# Patient Record
Sex: Female | Born: 1995 | Race: Black or African American | Hispanic: No | Marital: Single | Smoking: Former smoker
Health system: Southern US, Community
[De-identification: ages and names within clinical notes are randomized; demographics above are authoritative.]

## PROBLEM LIST (undated history)

## (undated) ENCOUNTER — Inpatient Hospital Stay (HOSPITAL_COMMUNITY): Payer: Self-pay

## (undated) DIAGNOSIS — N921 Excessive and frequent menstruation with irregular cycle: Secondary | ICD-10-CM

## (undated) DIAGNOSIS — K219 Gastro-esophageal reflux disease without esophagitis: Secondary | ICD-10-CM

## (undated) DIAGNOSIS — F419 Anxiety disorder, unspecified: Secondary | ICD-10-CM

## (undated) DIAGNOSIS — T7840XA Allergy, unspecified, initial encounter: Secondary | ICD-10-CM

## (undated) DIAGNOSIS — D649 Anemia, unspecified: Secondary | ICD-10-CM

## (undated) DIAGNOSIS — F32A Depression, unspecified: Secondary | ICD-10-CM

## (undated) DIAGNOSIS — Z22322 Carrier or suspected carrier of Methicillin resistant Staphylococcus aureus: Secondary | ICD-10-CM

## (undated) HISTORY — DX: Excessive and frequent menstruation with irregular cycle: N92.1

## (undated) HISTORY — DX: Allergy, unspecified, initial encounter: T78.40XA

## (undated) HISTORY — PX: LAPAROSCOPY ABDOMEN DIAGNOSTIC: PRO50

## (undated) HISTORY — PX: WISDOM TOOTH EXTRACTION: SHX21

## (undated) HISTORY — PX: ABDOMINAL HYSTERECTOMY: SHX81

## (undated) HISTORY — PX: NO PAST SURGERIES: SHX2092

---
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
## 2015-03-10 ENCOUNTER — Encounter (HOSPITAL_COMMUNITY): Payer: Self-pay | Admitting: Cardiology

## 2015-03-10 ENCOUNTER — Emergency Department (HOSPITAL_COMMUNITY)
Admission: EM | Admit: 2015-03-10 | Discharge: 2015-03-10 | Disposition: A | Discharge status: Home / Self Care | Payer: Medicaid Other | Attending: Emergency Medicine | Admitting: Emergency Medicine

## 2015-03-10 DIAGNOSIS — R0789 Other chest pain: Secondary | ICD-10-CM | POA: Insufficient documentation

## 2015-03-10 DIAGNOSIS — R21 Rash and other nonspecific skin eruption: Secondary | ICD-10-CM | POA: Diagnosis not present

## 2015-03-10 DIAGNOSIS — Z8614 Personal history of Methicillin resistant Staphylococcus aureus infection: Secondary | ICD-10-CM | POA: Insufficient documentation

## 2015-03-10 DIAGNOSIS — Z8742 Personal history of other diseases of the female genital tract: Secondary | ICD-10-CM | POA: Diagnosis not present

## 2015-03-10 DIAGNOSIS — L853 Xerosis cutis: Secondary | ICD-10-CM

## 2015-03-10 DIAGNOSIS — Z7951 Long term (current) use of inhaled steroids: Secondary | ICD-10-CM | POA: Insufficient documentation

## 2015-03-10 DIAGNOSIS — Z79899 Other long term (current) drug therapy: Secondary | ICD-10-CM | POA: Diagnosis not present

## 2015-03-10 DIAGNOSIS — L988 Other specified disorders of the skin and subcutaneous tissue: Secondary | ICD-10-CM | POA: Diagnosis not present

## 2015-03-10 DIAGNOSIS — J069 Acute upper respiratory infection, unspecified: Secondary | ICD-10-CM | POA: Diagnosis not present

## 2015-03-10 DIAGNOSIS — J45909 Unspecified asthma, uncomplicated: Secondary | ICD-10-CM | POA: Diagnosis not present

## 2015-03-10 DIAGNOSIS — R05 Cough: Secondary | ICD-10-CM | POA: Diagnosis present

## 2015-03-10 MED ORDER — BENZONATATE 100 MG PO CAPS: 100.0000 mg | ORAL_CAPSULE | Freq: Three times a day (TID) | ORAL | Status: DC

## 2015-03-10 MED ORDER — ALBUTEROL SULFATE HFA 108 (90 BASE) MCG/ACT IN AERS
2.0000 | INHALATION_SPRAY | RESPIRATORY_TRACT | Status: DC | PRN
  Administered 2015-03-10: 2 via RESPIRATORY_TRACT
  Filled 2015-03-10: qty 6.7

## 2015-03-10 NOTE — ED Notes (Signed)
Pt reports she has been coughing and has chest congestion for the past couple of days. Also reports a rash to bilateral skin folds in the groin.

## 2015-03-10 NOTE — Discharge Instructions (Signed)
Upper Respiratory Infection, Adult °Most upper respiratory infections (URIs) are a viral infection of the air passages leading to the lungs. A URI affects the nose, throat, and upper air passages. The most common type of URI is nasopharyngitis and is typically referred to as "the common cold." °URIs run their course and usually go away on their own. Most of the time, a URI does not require medical attention, but sometimes a bacterial infection in the upper airways can follow a viral infection. This is called a secondary infection. Sinus and middle ear infections are common types of secondary upper respiratory infections. °Bacterial pneumonia can also complicate a URI. A URI can worsen asthma and chronic obstructive pulmonary disease (COPD). Sometimes, these complications can require emergency medical care and may be life threatening.  °CAUSES °Almost all URIs are caused by viruses. A virus is a type of germ and can spread from one person to another.  °RISKS FACTORS °You may be at risk for a URI if:  °· You smoke.   °· You have chronic heart or lung disease. °· You have a weakened defense (immune) system.   °· You are very young or very old.   °· You have nasal allergies or asthma. °· You work in crowded or poorly ventilated areas. °· You work in health care facilities or schools. °SIGNS AND SYMPTOMS  °Symptoms typically develop 2-3 days after you come in contact with a cold virus. Most viral URIs last 7-10 days. However, viral URIs from the influenza virus (flu virus) can last 14-18 days and are typically more severe. Symptoms may include:  °· Runny or stuffy (congested) nose.   °· Sneezing.   °· Cough.   °· Sore throat.   °· Headache.   °· Fatigue.   °· Fever.   °· Loss of appetite.   °· Pain in your forehead, behind your eyes, and over your cheekbones (sinus pain). °· Muscle aches.   °DIAGNOSIS  °Your health care provider may diagnose a URI by: °· Physical exam. °· Tests to check that your symptoms are not due to  another condition such as: °· Strep throat. °· Sinusitis. °· Pneumonia. °· Asthma. °TREATMENT  °A URI goes away on its own with time. It cannot be cured with medicines, but medicines may be prescribed or recommended to relieve symptoms. Medicines may help: °· Reduce your fever. °· Reduce your cough. °· Relieve nasal congestion. °HOME CARE INSTRUCTIONS  °· Take medicines only as directed by your health care provider.   °· Gargle warm saltwater or take cough drops to comfort your throat as directed by your health care provider. °· Use a warm mist humidifier or inhale steam from a shower to increase air moisture. This may make it easier to breathe. °· Drink enough fluid to keep your urine clear or pale yellow.   °· Eat soups and other clear broths and maintain good nutrition.   °· Rest as needed.   °· Return to work when your temperature has returned to normal or as your health care provider advises. You may need to stay home longer to avoid infecting others. You can also use a face mask and careful hand washing to prevent spread of the virus. °· Increase the usage of your inhaler if you have asthma.   °· Do not use any tobacco products, including cigarettes, chewing tobacco, or electronic cigarettes. If you need help quitting, ask your health care provider. °PREVENTION  °The best way to protect yourself from getting a cold is to practice good hygiene.  °· Avoid oral or hand contact with people with cold   symptoms.   Wash your hands often if contact occurs.  There is no clear evidence that vitamin C, vitamin E, echinacea, or exercise reduces the chance of developing a cold. However, it is always recommended to get plenty of rest, exercise, and practice good nutrition.  SEEK MEDICAL CARE IF:   You are getting worse rather than better.   Your symptoms are not controlled by medicine.   You have chills.  You have worsening shortness of breath.  You have brown or red mucus.  You have yellow or brown nasal  discharge.  You have pain in your face, especially when you bend forward.  You have a fever.  You have swollen neck glands.  You have pain while swallowing.  You have white areas in the back of your throat. SEEK IMMEDIATE MEDICAL CARE IF:   You have severe or persistent:  Headache.  Ear pain.  Sinus pain.  Chest pain.  You have chronic lung disease and any of the following:  Wheezing.  Prolonged cough.  Coughing up blood.  A change in your usual mucus.  You have a stiff neck.  You have changes in your:  Vision.  Hearing.  Thinking.  Mood. MAKE SURE YOU:   Understand these instructions.  Will watch your condition.  Will get help right away if you are not doing well or get worse.   This information is not intended to replace advice given to you by your health care provider. Make sure you discuss any questions you have with your health care provider.   Document Released: 08/22/2000 Document Revised: 07/13/2014 Document Reviewed: 06/03/2013 Elsevier Interactive Patient Education 2016 [employer] Rash A rash is a change in the color or texture of the skin. There are many different types of rashes. You may have other problems that accompany your rash. CAUSES   Infections.  Allergic reactions. This can include allergies to pets or foods.  Certain medicines.  Exposure to certain chemicals, soaps, or cosmetics.  Heat.  Exposure to poisonous plants.  Tumors, both cancerous and noncancerous. SYMPTOMS   Redness.  Scaly skin.  Itchy skin.  Dry or cracked skin.  Bumps.  Blisters.  Pain. DIAGNOSIS  Your caregiver may do a physical exam to determine what type of rash you have. A skin sample (biopsy) may be taken and examined under a microscope. TREATMENT  Treatment depends on the type of rash you have. Your caregiver may prescribe certain medicines. For serious conditions, you may need to see a skin doctor (dermatologist). HOME CARE  INSTRUCTIONS   Avoid the substance that caused your rash.  Do not scratch your rash. This can cause infection.  You may take cool baths to help stop itching.  Only take over-the-counter or prescription medicines as directed by your caregiver.  Keep all follow-up appointments as directed by your caregiver. SEEK IMMEDIATE MEDICAL CARE IF:  You have increasing pain, swelling, or redness.  You have a fever.  You have new or severe symptoms.  You have body aches, diarrhea, or vomiting.  Your rash is not better after 3 days. MAKE SURE YOU:  Understand these instructions.  Will watch your condition.  Will get help right away if you are not doing well or get worse.   This information is not intended to replace advice given to you by your health care provider. Make sure you discuss any questions you have with your health care provider.   Document Released: 02/16/2002 Document Revised: 03/19/2014 Document Reviewed: 07/14/2014 Elsevier Interactive  Patient Education 2016 [employer].

## 2015-03-10 NOTE — ED Provider Notes (Signed)
CSN: [id number]     Arrival date & time 03/10/15  1001 History  By signing my name below, I, [name], attest that this documentation has been prepared under the direction and in the presence of Marlon Pel, PA-C Electronically Signed: Charline Bills, ED Scribe 03/10/2015 at 11:02 AM.   Chief Complaint  Patient presents with  . Cough  . Rash   The history is provided by the patient. No language interpreter was used.   HPI Comments: Melanie Cain is a 19 y.o. female, with a h/o asthma, who presents to the Emergency Department complaining of gradually worsening cough for 2-3 days. Pt reports associated sore throat, congestion, chest tightness only with coughing and non-pruritic rash to groin first noticed 2 days ago. She has tried Delsym and her albuterol inhaler without significant relief. Pt denies fever. She noticed the rash after shaving with a dull razor the day prior, she describes it as flaky and looks like dry skin. Her mom recommended she use cocoa butter for the area but she was afraid to use it without having it looked at first. No vaginal discharge, or vaginal bleeding.  PCP: Leo Grosser, MD  ROS: The patient denies diaphoresis, fever, headache, weakness (general or focal), confusion, change of vision,  dysphagia, aphagia, shortness of breath,  abdominal pains, nausea, vomiting, diarrhea, lower extremity swelling, rash, neck pain, chest pain   Past Medical History  Diagnosis Date  . Allergy   . Asthma   . Menometrorrhagia   . MRSA (methicillin resistant staph aureus) culture positive 2012    culture from abscess   Past Surgical History  Procedure Laterality Date  . No past surgeries     Family History  Problem Relation Age of Onset  . Asthma Other   . Cancer Other   . Asthma Mother   . Hypertension Mother   . Asthma Father   . Asthma Sister     2 sisters  . Cancer Maternal Grandmother     breast cancer  . Hearing loss Maternal Grandmother     Social History  Substance Use Topics  . Smoking status: Never Smoker   . Smokeless tobacco: Never Used  . Alcohol Use: No   OB History    Gravida Para Term Preterm AB TAB SAB Ectopic Multiple Living   0 0 0 0 0 0 1     Review of Systems  Constitutional: Negative for fever.  HENT: Positive for congestion and sore throat.   Respiratory: Positive for cough and chest tightness (only with coughing).   Skin: Positive for rash.  All other systems reviewed and are negative.  Allergies  Montelukast sodium and Prednisone  Home Medications   Prior to Admission medications   Medication Sig Start Date End Date Taking? Authorizing Provider  acetaminophen (TYLENOL) 325 MG tablet Take 650 mg by mouth every 6 (six) hours as needed for mild pain.    Historical Provider, MD  albuterol (PROVENTIL HFA;VENTOLIN HFA) 108 (90 BASE) MCG/ACT inhaler Inhale 2 puffs into the lungs 2 (two) times daily as needed for wheezing or shortness of breath.     Historical Provider, MD  benzonatate (TESSALON) 100 MG capsule Take 1 capsule (100 mg total) by mouth every 8 (eight) hours. 03/10/15   Dorothie Wah Neva Seat, PA-C  Fluticasone-Salmeterol (ADVAIR) 500-50 MCG/DOSE AEPB Inhale 1 puff into the lungs 2 (two) times daily.     Historical Provider, MD  levalbuterol Pauline Aus) 0.63 MG/3ML nebulizer solution Take 0.63 mg by nebulization  2 (two) times daily as needed for wheezing or shortness of breath.     Historical Provider, MD  norgestrel-ethinyl estradiol (LO/OVRAL,CRYSELLE) 0.3-30 MG-MCG tablet Take 1 tablet by mouth daily. 11/11/13   Allie BossierMyra C Dove, MD  ondansetron (ZOFRAN) 4 MG tablet Take 1 tablet (4 mg total) by mouth every 6 (six) hours. 05/25/14   Marny LowensteinJulie N Wenzel, PA-C   BP 129/68 mmHg  Pulse 71  Temp(Src) 98.2 F (36.8 C) (Oral)  Resp 18  Ht 5\' 5"  (1.651 m)  Wt 131 lb (59.421 kg)  BMI 21.80 kg/m2  SpO2 100%  LMP 02/22/2015 Physical Exam  Constitutional: She is oriented to person, place, and time. She  appears well-developed and well-nourished. No distress.  HENT:  Head: Normocephalic and atraumatic.  Right Ear: Tympanic membrane and ear canal normal.  Left Ear: Tympanic membrane and ear canal normal.  Nose: Nose normal.  Mouth/Throat: Uvula is midline, oropharynx is clear and moist and mucous membranes are normal.  Eyes: Conjunctivae and EOM are normal. Pupils are equal, round, and reactive to light.  Neck: Normal range of motion. Neck supple. No tracheal deviation present.  Cardiovascular: Normal rate and regular rhythm.   Pulmonary/Chest: Effort normal. No respiratory distress.  Abdominal: Soft.  No signs of abdominal distention  Musculoskeletal: Normal range of motion.  No LE swelling  Neurological: She is alert and oriented to person, place, and time.  Acting at baseline  Skin: Skin is warm and dry. No rash noted.  Dry skin to inguinal folds. No vesicles, lesions, papules. It does not itch and is not painful to palpation  Psychiatric: She has a normal mood and affect. Her behavior is normal.  Nursing note and vitals reviewed.  ED Course  Procedures (including critical care time) DIAGNOSTIC STUDIES: Oxygen Saturation is 100% on RA, normal by my interpretation.    COORDINATION OF CARE: 10:58 AM-Discussed treatment plan which includes albuterol inhaler and Tessalon with pt at bedside and pt agreed to plan.  She is to use cream or ointment lotion for inguinal rash, to return if symptoms change or don't improve.  Labs Review Labs Reviewed - No data to display  Imaging Review No results found. I have personally reviewed and evaluated these images and lab results as part of my medical decision-making.   EKG Interpretation None      MDM   Final diagnoses:  URI (upper respiratory infection)  Dry skin   Patients symptoms are consistent with URI, likely viral etiology. Discussed that antibiotics are not indicated for viral infections. Pt will be discharged with symptomatic  treatment.  Verbalizes understanding and is agreeable with plan. Pt is hemodynamically stable & in NAD prior to dc.  I personally performed the services described in this documentation, which was scribed in my presence. The recorded information has been reviewed and is accurate.   Marlon Peliffany Atalaya Zappia, PA-C 03/10/15 1107  Marlon Peliffany Jaylah Goodlow, PA-C 03/10/15 1109  Donnetta HutchingBrian Cook, MD 03/10/15 [phone]

## 2015-03-13 NOTE — L&D Delivery Note (Signed)
Delivery Note At 1:12 PM a viable female was delivered via Vaginal, Spontaneous Delivery (Presentation: cephalic).  APGAR: 9, 9; weight  .   Placenta status: intact. Cord: 3V with the following complications:none .  Cord pH: n/a  Anesthesia:  none Episiotomy: None Lacerations: 1st degree;Vaginal;Periurethral Suture Repair: 3.0 vicryl Est. Blood Loss (mL): 200  Mom to postpartum.  Baby to Couplet care / Skin to Skin.  Purcell NailsROBERTS,Debbi Strandberg Y 11/23/2015, 1:45 PM

## 2015-03-16 ENCOUNTER — Emergency Department (INDEPENDENT_AMBULATORY_CARE_PROVIDER_SITE_OTHER)
Admission: EM | Admit: 2015-03-16 | Discharge: 2015-03-16 | Disposition: A | Discharge status: Home / Self Care | Payer: Medicaid Other | Source: Home / Self Care | Attending: Family Medicine | Admitting: Family Medicine

## 2015-03-16 ENCOUNTER — Encounter (HOSPITAL_COMMUNITY): Payer: Self-pay | Admitting: *Deleted

## 2015-03-16 DIAGNOSIS — J069 Acute upper respiratory infection, unspecified: Secondary | ICD-10-CM | POA: Diagnosis not present

## 2015-03-16 MED ORDER — IPRATROPIUM BROMIDE 0.06 % NA SOLN: 2.0000 | Freq: Four times a day (QID) | NASAL | Status: DC

## 2015-03-16 NOTE — ED Notes (Signed)
Pt  Reports     Symptoms    Of  Cough         As well  As  A  Rash   From   tessalon  pearles            Pt  Reports   Symptoms  Of  Cough   As  Well    Pt is  Requesting a  Note  For  Work   She  Is  Sitting  Upright on the  Exam table  Speaking   In   Complete  sentances

## 2015-03-16 NOTE — ED Provider Notes (Signed)
CSN: [id number]     Arrival date & time 03/16/15  1708 History   First MD Initiated Contact with Patient 03/16/15 1915     Chief Complaint  Patient presents with  . Cough   (Consider location/radiation/quality/duration/timing/severity/associated sxs/prior Treatment) Patient is a 20 y.o. female presenting with cough. The history is provided by the patient and a parent.  Cough Cough characteristics:  Non-productive and dry Severity:  Mild Onset quality:  Gradual Duration:  1 week Progression:  Improving Chronicity:  New Context: sick contacts, upper respiratory infection and weather changes   Context comment:  Seen in ER 12/29. Associated symptoms: rhinorrhea   Associated symptoms: no fever, no shortness of breath and no wheezing     Past Medical History  Diagnosis Date  . Allergy   . Asthma   . Menometrorrhagia   . MRSA (methicillin resistant staph aureus) culture positive 2012    culture from abscess   Past Surgical History  Procedure Laterality Date  . No past surgeries     Family History  Problem Relation Age of Onset  . Asthma Other   . Cancer Other   . Asthma Mother   . Hypertension Mother   . Asthma Father   . Asthma Sister     2 sisters  . Cancer Maternal Grandmother     breast cancer  . Hearing loss Maternal Grandmother    Social History  Substance Use Topics  . Smoking status: Never Smoker   . Smokeless tobacco: Never Used  . Alcohol Use: No   OB History    Gravida Para Term Preterm AB TAB SAB Ectopic Multiple Living   1 1 1  0 0 0 0 0 0 1     Review of Systems  Constitutional: Negative.  Negative for fever.  HENT: Positive for congestion, postnasal drip and rhinorrhea.   Respiratory: Positive for cough. Negative for shortness of breath and wheezing.   Cardiovascular: Negative.   All other systems reviewed and are negative.   Allergies  Montelukast sodium and Prednisone  Home Medications   Prior to Admission medications   Medication Sig  Start Date End Date Taking? Authorizing Provider  acetaminophen (TYLENOL) 325 MG tablet Take 650 mg by mouth every 6 (six) hours as needed for mild pain.    Historical Provider, MD  albuterol (PROVENTIL HFA;VENTOLIN HFA) 108 (90 BASE) MCG/ACT inhaler Inhale 2 puffs into the lungs 2 (two) times daily as needed for wheezing or shortness of breath.     Historical Provider, MD  benzonatate (TESSALON) 100 MG capsule Take 1 capsule (100 mg total) by mouth every 8 (eight) hours. 03/10/15   Tiffany Neva SeatGreene, PA-C  Fluticasone-Salmeterol (ADVAIR) 500-50 MCG/DOSE AEPB Inhale 1 puff into the lungs 2 (two) times daily.     Historical Provider, MD  levalbuterol Pauline Aus(XOPENEX) 0.63 MG/3ML nebulizer solution Take 0.63 mg by nebulization 2 (two) times daily as needed for wheezing or shortness of breath.     Historical Provider, MD  norgestrel-ethinyl estradiol (LO/OVRAL,CRYSELLE) 0.3-30 MG-MCG tablet Take 1 tablet by mouth daily. 11/11/13   Allie BossierMyra C Dove, MD  ondansetron (ZOFRAN) 4 MG tablet Take 1 tablet (4 mg total) by mouth every 6 (six) hours. 05/25/14   Marny LowensteinJulie N Wenzel, PA-C   Meds Ordered and Administered this Visit  Medications - No data to display  BP 114/62 mmHg  Pulse 75  Temp(Src) 97.9 F (36.6 C) (Oral)  Resp 18  SpO2 100%  LMP 02/22/2015 No data found.   Physical Exam  Constitutional: She is oriented to person, place, and time. She appears well-developed and well-nourished. No distress.  HENT:  Mouth/Throat: Oropharynx is clear and moist.  Neck: Normal range of motion. Neck supple.  Cardiovascular: Normal heart sounds and intact distal pulses.   Pulmonary/Chest: Effort normal and breath sounds normal.  Lymphadenopathy:    She has no cervical adenopathy.  Neurological: She is alert and oriented to person, place, and time.  Skin: Skin is warm.  Nursing note and vitals reviewed.   ED Course  Procedures (including critical care time)  Labs Review Labs Reviewed - No data to display  Imaging  Review No results found.   Visual Acuity Review  Right Eye Distance:   Left Eye Distance:   Bilateral Distance:    Right Eye Near:   Left Eye Near:    Bilateral Near:         MDM  No diagnosis found.     Linna Hoff, MD 03/16/15 [phone]

## 2015-03-19 ENCOUNTER — Encounter (HOSPITAL_COMMUNITY): Payer: Self-pay | Admitting: *Deleted

## 2015-03-19 ENCOUNTER — Inpatient Hospital Stay (HOSPITAL_COMMUNITY)
Admission: AD | Admit: 2015-03-19 | Discharge: 2015-03-19 | Disposition: A | Discharge status: Home / Self Care | Payer: Medicaid Other | Source: Ambulatory Visit | Attending: Obstetrics and Gynecology | Admitting: Obstetrics and Gynecology

## 2015-03-19 ENCOUNTER — Inpatient Hospital Stay (HOSPITAL_COMMUNITY): Payer: Medicaid Other

## 2015-03-19 DIAGNOSIS — Z888 Allergy status to other drugs, medicaments and biological substances status: Secondary | ICD-10-CM | POA: Diagnosis not present

## 2015-03-19 DIAGNOSIS — O23591 Infection of other part of genital tract in pregnancy, first trimester: Secondary | ICD-10-CM | POA: Diagnosis not present

## 2015-03-19 DIAGNOSIS — O3680X0 Pregnancy with inconclusive fetal viability, not applicable or unspecified: Secondary | ICD-10-CM | POA: Diagnosis not present

## 2015-03-19 DIAGNOSIS — R1084 Generalized abdominal pain: Secondary | ICD-10-CM | POA: Diagnosis not present

## 2015-03-19 DIAGNOSIS — N760 Acute vaginitis: Secondary | ICD-10-CM | POA: Diagnosis not present

## 2015-03-19 DIAGNOSIS — O26899 Other specified pregnancy related conditions, unspecified trimester: Secondary | ICD-10-CM

## 2015-03-19 DIAGNOSIS — J45909 Unspecified asthma, uncomplicated: Secondary | ICD-10-CM | POA: Diagnosis not present

## 2015-03-19 DIAGNOSIS — Z3A01 Less than 8 weeks gestation of pregnancy: Secondary | ICD-10-CM | POA: Insufficient documentation

## 2015-03-19 DIAGNOSIS — O9989 Other specified diseases and conditions complicating pregnancy, childbirth and the puerperium: Secondary | ICD-10-CM | POA: Diagnosis not present

## 2015-03-19 DIAGNOSIS — A499 Bacterial infection, unspecified: Secondary | ICD-10-CM

## 2015-03-19 DIAGNOSIS — B9689 Other specified bacterial agents as the cause of diseases classified elsewhere: Secondary | ICD-10-CM

## 2015-03-19 DIAGNOSIS — R109 Unspecified abdominal pain: Secondary | ICD-10-CM | POA: Diagnosis not present

## 2015-03-19 LAB — URINALYSIS, ROUTINE W REFLEX MICROSCOPIC
BILIRUBIN URINE: NEGATIVE
Glucose, UA: NEGATIVE mg/dL
HGB URINE DIPSTICK: NEGATIVE
Ketones, ur: NEGATIVE mg/dL
Leukocytes, UA: NEGATIVE
Nitrite: NEGATIVE
Protein, ur: NEGATIVE mg/dL
SPECIFIC GRAVITY, URINE: 1.015 (ref 1.005–1.030)
pH: 7.5 (ref 5.0–8.0)

## 2015-03-19 LAB — WET PREP, GENITAL
Sperm: NONE SEEN
Trich, Wet Prep: NONE SEEN
Yeast Wet Prep HPF POC: NONE SEEN

## 2015-03-19 LAB — ABO/RH: ABO/RH(D): B POS

## 2015-03-19 LAB — CBC
HEMATOCRIT: 36.8 % (ref 36.0–46.0)
Hemoglobin: 11.6 g/dL — ABNORMAL LOW (ref 12.0–15.0)
MCH: 23.1 pg — ABNORMAL LOW (ref 26.0–34.0)
MCHC: 31.5 g/dL (ref 30.0–36.0)
MCV: 73.2 fL — ABNORMAL LOW (ref 78.0–100.0)
Platelets: 310 10*3/uL (ref 150–400)
RBC: 5.03 MIL/uL (ref 3.87–5.11)
RDW: 18.1 % — ABNORMAL HIGH (ref 11.5–15.5)
WBC: 5.5 10*3/uL (ref 4.0–10.5)

## 2015-03-19 LAB — POCT PREGNANCY, URINE: Preg Test, Ur: POSITIVE — AB

## 2015-03-19 LAB — HCG, QUANTITATIVE, PREGNANCY: hCG, Beta Chain, Quant, S: 1309 m[IU]/mL — ABNORMAL HIGH (ref <5)

## 2015-03-19 LAB — HIV ANTIBODY (ROUTINE TESTING W REFLEX): HIV SCREEN 4TH GENERATION: NONREACTIVE

## 2015-03-19 MED ORDER — METRONIDAZOLE 500 MG PO TABS: 500.0000 mg | ORAL_TABLET | Freq: Two times a day (BID) | ORAL | Status: DC

## 2015-03-19 NOTE — Discharge Instructions (Signed)

## 2015-03-19 NOTE — MAU Provider Note (Signed)
History     CSN: [id number]  Arrival date and time: 03/19/15 4098   First Provider Initiated Contact with Patient 03/19/15 0131      Chief Complaint  Patient presents with  . Abdominal Cramping   HPI   Ms.Melanie Cain is a 20 y.o. female G1P1001 at Unknown presenting to MAU with abdominal pain. She had a negative pregnancy test on Monday, however missed her period and rechecked today and it was positive.  Her abdominal pain started 1.5 weeks ago. The pain is located in the lower part of her stomach; worse on the left side. The pain comes and goes. She has not tried anything for pain.   OB History    Gravida Para Term Preterm AB TAB SAB Ectopic Multiple Living   1 1 1  0 0 0 0 0 0 1      Past Medical History  Diagnosis Date  . Allergy   . Asthma   . Menometrorrhagia   . MRSA (methicillin resistant staph aureus) culture positive 2012    culture from abscess    Past Surgical History  Procedure Laterality Date  . No past surgeries      Family History  Problem Relation Age of Onset  . Asthma Other   . Cancer Other   . Asthma Mother   . Hypertension Mother   . Asthma Father   . Asthma Sister     2 sisters  . Cancer Maternal Grandmother     breast cancer  . Hearing loss Maternal Grandmother     Social History  Substance Use Topics  . Smoking status: Never Smoker   . Smokeless tobacco: Never Used  . Alcohol Use: No    Allergies:  Allergies  Allergen Reactions  . Montelukast Sodium Other (See Comments)    Hallucinations  . Prednisone Other (See Comments)    Swollen glands   . Tessalon [Benzonatate]     Prescriptions prior to admission  Medication Sig Dispense Refill Last Dose  . acetaminophen (TYLENOL) 325 MG tablet Take 650 mg by mouth every 6 (six) hours as needed for mild pain.   More than a month at Unknown time  . albuterol (PROVENTIL HFA;VENTOLIN HFA) 108 (90 BASE) MCG/ACT inhaler Inhale 2 puffs into the lungs 2 (two) times daily as needed for  wheezing or shortness of breath.    Past Week at Unknown time  . Fluticasone-Salmeterol (ADVAIR) 500-50 MCG/DOSE AEPB Inhale 1 puff into the lungs 2 (two) times daily.    Past Month at Unknown time  . ipratropium (ATROVENT) 0.06 % nasal spray Place 2 sprays into both nostrils 4 (four) times daily. 15 mL 1   . levalbuterol (XOPENEX) 0.63 MG/3ML nebulizer solution Take 0.63 mg by nebulization 2 (two) times daily as needed for wheezing or shortness of breath.    Past Month at Unknown time  . norgestrel-ethinyl estradiol (LO/OVRAL,CRYSELLE) 0.3-30 MG-MCG tablet Take 1 tablet by mouth daily. 1 Package 11 09/06/2014 at Unknown time  . ondansetron (ZOFRAN) 4 MG tablet Take 1 tablet (4 mg total) by mouth every 6 (six) hours. 12 tablet 0 Past Month at Unknown time   Results for orders placed or performed during the hospital encounter of 03/19/15 (from the past 48 hour(s))  Urinalysis, Routine w reflex microscopic (not at [facility])     Status: None   Collection Time: 03/19/15 12:53 AM  Result Value Ref Range   Color, Urine YELLOW YELLOW   APPearance CLEAR CLEAR   Specific Gravity, Urine  1.015 1.005 - 1.030   pH 7.5 5.0 - 8.0   Glucose, UA NEGATIVE NEGATIVE mg/dL   Hgb urine dipstick NEGATIVE NEGATIVE   Bilirubin Urine NEGATIVE NEGATIVE   Ketones, ur NEGATIVE NEGATIVE mg/dL   Protein, ur NEGATIVE NEGATIVE mg/dL   Nitrite NEGATIVE NEGATIVE   Leukocytes, UA NEGATIVE NEGATIVE    Comment: MICROSCOPIC NOT DONE ON URINES WITH NEGATIVE PROTEIN, BLOOD, LEUKOCYTES, NITRITE, OR GLUCOSE <1000 mg/dL.  Pregnancy, urine POC     Status: Abnormal   Collection Time: 03/19/15  1:07 AM  Result Value Ref Range   Preg Test, Ur POSITIVE (A) NEGATIVE    Comment:        THE SENSITIVITY OF THIS METHODOLOGY IS >24 mIU/mL   CBC     Status: Abnormal   Collection Time: 03/19/15  1:40 AM  Result Value Ref Range   WBC 5.5 4.0 - 10.5 K/uL   RBC 5.03 3.87 - 5.11 MIL/uL   Hemoglobin 11.6 (L) 12.0 - 15.0 g/dL   HCT 16.1 09.6 -  04.5 %   MCV 73.2 (L) 78.0 - 100.0 fL   MCH 23.1 (L) 26.0 - 34.0 pg   MCHC 31.5 30.0 - 36.0 g/dL   RDW 40.9 (H) 81.1 - 91.4 %   Platelets 310 150 - 400 K/uL  ABO/Rh     Status: None   Collection Time: 03/19/15  1:40 AM  Result Value Ref Range   ABO/RH(D) B POS   hCG, quantitative, pregnancy     Status: Abnormal   Collection Time: 03/19/15  1:40 AM  Result Value Ref Range   hCG, Beta Chain, Quant, S 1309 (H) <5 mIU/mL    Comment:          GEST. AGE      CONC.  (mIU/mL)   <=1 WEEK        5 - 50     2 WEEKS       50 - 500     3 WEEKS       100 - 10,000     4 WEEKS     1,000 - 30,000     5 WEEKS     3,500 - 115,000   6-8 WEEKS     12,000 - 270,000    12 WEEKS     15,000 - 220,000        FEMALE AND NON-PREGNANT FEMALE:     LESS THAN 5 mIU/mL   Wet prep, genital     Status: Abnormal   Collection Time: 03/19/15  1:45 AM  Result Value Ref Range   Yeast Wet Prep HPF POC NONE SEEN NONE SEEN   Trich, Wet Prep NONE SEEN NONE SEEN   Clue Cells Wet Prep HPF POC PRESENT (A) NONE SEEN   WBC, Wet Prep HPF POC MODERATE (A) NONE SEEN    Comment: MODERATE BACTERIA SEEN   Sperm NONE SEEN    US Ob Comp Less 14 Wks  03/19/2015  CLINICAL DATA:  Acute onset of generalized abdominal pain. Initial encounter. EXAM: OBSTETRIC <14 WK [place] AND TRANSVAGINAL OB US TECHNIQUE: Both transabdominal and transvaginal ultrasound examinations were performed for complete evaluation of the gestation as well as the maternal uterus, adnexal regions, and pelvic cul-de-sac. Transvaginal technique was performed to assess early pregnancy. COMPARISON:  Pelvic ultrasound performed 09/07/2014 FINDINGS: Intrauterine gestational sac: Visualized/normal in shape. Yolk sac:  No Embryo:  No MSD: 2.8 mm   5 w   0  d Subchorionic hemorrhage: A  small amount of subchorionic hemorrhage is noted, measuring 1.0 x 0.2 x 0.8 cm. Maternal uterus/adnexae: There also appears to be a tiny endometrial cyst, measuring 0.4 cm. The ovaries are unremarkable  in appearance. The right ovary measures 3.2 x 1.3 x 2.1 cm, while the left ovary measures 4.0 x 2.6 x 3.3 cm. No suspicious adnexal masses are seen; there is no evidence for ovarian torsion. No free fluid is seen within the pelvic cul-de-sac. IMPRESSION: 1. Single intrauterine gestational sac noted, with a mean sac diameter of 3 mm, corresponding to a gestational age of [redacted] weeks 0 days. This matches the gestational age of [redacted] weeks 3 days by LMP, reflecting an estimated delivery of November 23, 2015. No yolk sac or embryo is yet seen, within normal limits. 2. Small amount of subchorionic hemorrhage noted. 3. Tiny endometrial cyst noted. Electronically Signed   By: Roanna Raider M.D.   On: 03/19/2015 02:44   US Ob Transvaginal  03/19/2015  CLINICAL DATA:  Acute onset of generalized abdominal pain. Initial encounter. EXAM: OBSTETRIC <14 WK [place] AND TRANSVAGINAL OB US TECHNIQUE: Both transabdominal and transvaginal ultrasound examinations were performed for complete evaluation of the gestation as well as the maternal uterus, adnexal regions, and pelvic cul-de-sac. Transvaginal technique was performed to assess early pregnancy. COMPARISON:  Pelvic ultrasound performed 09/07/2014 FINDINGS: Intrauterine gestational sac: Visualized/normal in shape. Yolk sac:  No Embryo:  No MSD: 2.8 mm   5 w   0  d Subchorionic hemorrhage: A small amount of subchorionic hemorrhage is noted, measuring 1.0 x 0.2 x 0.8 cm. Maternal uterus/adnexae: There also appears to be a tiny endometrial cyst, measuring 0.4 cm. The ovaries are unremarkable in appearance. The right ovary measures 3.2 x 1.3 x 2.1 cm, while the left ovary measures 4.0 x 2.6 x 3.3 cm. No suspicious adnexal masses are seen; there is no evidence for ovarian torsion. No free fluid is seen within the pelvic cul-de-sac. IMPRESSION: 1. Single intrauterine gestational sac noted, with a mean sac diameter of 3 mm, corresponding to a gestational age of [redacted] weeks 0 days. This matches the  gestational age of [redacted] weeks 3 days by LMP, reflecting an estimated delivery of November 23, 2015. No yolk sac or embryo is yet seen, within normal limits. 2. Small amount of subchorionic hemorrhage noted. 3. Tiny endometrial cyst noted. Electronically Signed   By: Roanna Raider M.D.   On: 03/19/2015 02:44    Review of Systems  Constitutional: Negative for fever and chills.  Gastrointestinal: Positive for nausea and abdominal pain. Negative for vomiting, diarrhea and constipation.  Genitourinary: Negative for dysuria and urgency.   Physical Exam   Blood pressure 121/66, pulse 72, temperature 98.4 F (36.9 C), temperature source Oral, resp. rate 16, height 5\' 5"  (1.651 m), weight 135 lb 6 oz (61.406 kg), last menstrual period 02/16/2015, SpO2 100 %, not currently breastfeeding.  Physical Exam  Constitutional: She is oriented to person, place, and time. She appears well-developed and well-nourished. No distress.  HENT:  Head: Normocephalic.  Eyes: Pupils are equal, round, and reactive to light.  GI: Soft. Normal appearance. There is generalized tenderness. There is no rigidity, no rebound and no guarding.  Genitourinary:  Wet prep and GC collected by RN without speculum   Musculoskeletal: Normal range of motion.  Neurological: She is alert and oriented to person, place, and time.  Skin: Skin is warm. She is not diaphoretic.  Psychiatric: Her behavior is normal.    MAU Course  Procedures  None  MDM  US Wet pre GC ABO CBC  Beta hcg   Assessment and Plan   A:  . 1. Pregnancy of unknown anatomic location   2. Abdominal pain in pregnancy, antepartum   3. Bacterial vaginosis     P:  Discharge home in stable condition Return to MAU if symptoms worsen Return on Monday between 8am-11am for repeat beta hcg level Ectopic precautions Pelvic rest RX: Flagyl    Duane LopeJennifer I Lavonda Thal, NP 03/19/2015 11:48 AM

## 2015-03-19 NOTE — MAU Note (Signed)
Took a home pregnancy test last week, was negative and then one today and it was positive.  Want to know which if I am pregnant or not.  Abd cramps for over a week.  No bleeding.

## 2015-03-21 ENCOUNTER — Ambulatory Visit: Payer: Self-pay | Admitting: Obstetrics and Gynecology

## 2015-03-21 ENCOUNTER — Telehealth: Payer: Self-pay | Admitting: *Deleted

## 2015-03-21 LAB — GC/CHLAMYDIA PROBE AMP (~~LOC~~) NOT AT ARMC
Chlamydia: POSITIVE — AB
Neisseria Gonorrhea: NEGATIVE

## 2015-03-21 NOTE — Telephone Encounter (Signed)
Patient was scheduled for a stat bhcg this morning but was a no-show. Called and spoke to patient, asked if we could reschedule her lab draw. Patient was willing and stated she would "try my hardest" to come in at 0900 tomorrow 03/21/08. Will notify front office to add patient to schedule.

## 2015-03-22 ENCOUNTER — Telehealth (HOSPITAL_COMMUNITY): Payer: Self-pay | Admitting: *Deleted

## 2015-03-22 ENCOUNTER — Ambulatory Visit (INDEPENDENT_AMBULATORY_CARE_PROVIDER_SITE_OTHER): Payer: Medicaid Other | Admitting: Advanced Practice Midwife

## 2015-03-22 DIAGNOSIS — O3680X0 Pregnancy with inconclusive fetal viability, not applicable or unspecified: Secondary | ICD-10-CM

## 2015-03-22 DIAGNOSIS — R21 Rash and other nonspecific skin eruption: Secondary | ICD-10-CM

## 2015-03-22 DIAGNOSIS — O98811 Other maternal infectious and parasitic diseases complicating pregnancy, first trimester: Principal | ICD-10-CM

## 2015-03-22 DIAGNOSIS — A749 Chlamydial infection, unspecified: Secondary | ICD-10-CM

## 2015-03-22 DIAGNOSIS — O0281 Inappropriate change in quantitative human chorionic gonadotropin (hCG) in early pregnancy: Secondary | ICD-10-CM

## 2015-03-22 LAB — HCG, QUANTITATIVE, PREGNANCY: hCG, Beta Chain, Quant, S: 4399 m[IU]/mL — ABNORMAL HIGH (ref <5)

## 2015-03-22 MED ORDER — AZITHROMYCIN 500 MG PO TABS: ORAL_TABLET | ORAL | Status: DC

## 2015-03-22 MED ORDER — TRIAMCINOLONE 0.1 % CREAM:EUCERIN CREAM 1:1
1.0000 | TOPICAL_CREAM | Freq: Two times a day (BID) | CUTANEOUS | Status: DC
Start: 2015-03-22 — End: 2015-03-28

## 2015-03-22 MED ORDER — CONCEPT OB 130-92.4-1 MG PO CAPS: 1.0000 | ORAL_CAPSULE | Freq: Every day | ORAL | Status: DC

## 2015-03-22 NOTE — Patient Instructions (Signed)
First Trimester of Pregnancy The first trimester of pregnancy is from week 1 until the end of week 12 (months 1 through 3). A week after a sperm fertilizes an egg, the egg will implant on the wall of the uterus. This embryo will begin to develop into a baby. Genes from you and your partner are forming the baby. The female genes determine whether the baby is a boy or a girl. At 6-8 weeks, the eyes and face are formed, and the heartbeat can be seen on ultrasound. At the end of 12 weeks, all the baby's organs are formed.  Now that you are pregnant, you will want to do everything you can to have a healthy baby. Two of the most important things are to get good prenatal care and to follow your health care provider's instructions. Prenatal care is all the medical care you receive before the baby's birth. This care will help prevent, find, and treat any problems during the pregnancy and childbirth. BODY CHANGES Your body goes through many changes during pregnancy. The changes vary from woman to woman.   You may gain or lose a couple of pounds at first.  You may feel sick to your stomach (nauseous) and throw up (vomit). If the vomiting is uncontrollable, call your health care provider.  You may tire easily.  You may develop headaches that can be relieved by medicines approved by your health care provider.  You may urinate more often. Painful urination may mean you have a bladder infection.  You may develop heartburn as a result of your pregnancy.  You may develop constipation because certain hormones are causing the muscles that push waste through your intestines to slow down.  You may develop hemorrhoids or swollen, bulging veins (varicose veins).  Your breasts may begin to grow larger and become tender. Your nipples may stick out more, and the tissue that surrounds them (areola) may become darker.  Your gums may bleed and may be sensitive to brushing and flossing.  Dark spots or blotches (chloasma,  mask of pregnancy) may develop on your face. This will likely fade after the baby is born.  Your menstrual periods will stop.  You may have a loss of appetite.  You may develop cravings for certain kinds of food.  You may have changes in your emotions from day to day, such as being excited to be pregnant or being concerned that something may go wrong with the pregnancy and baby.  You may have more vivid and strange dreams.  You may have changes in your hair. These can include thickening of your hair, rapid growth, and changes in texture. Some women also have hair loss during or after pregnancy, or hair that feels dry or thin. Your hair will most likely return to normal after your baby is born. WHAT TO EXPECT AT YOUR PRENATAL VISITS During a routine prenatal visit:  You will be weighed to make sure you and the baby are growing normally.  Your blood pressure will be taken.  Your abdomen will be measured to track your baby's growth.  The fetal heartbeat will be listened to starting around week 10 or 12 of your pregnancy.  Test results from any previous visits will be discussed. Your health care provider may ask you:  How you are feeling.  If you are feeling the baby move.  If you have had any abnormal symptoms, such as leaking fluid, bleeding, severe headaches, or abdominal cramping.  If you are using any tobacco products,   including cigarettes, chewing tobacco, and electronic cigarettes.  If you have any questions. Other tests that may be performed during your first trimester include:  Blood tests to find your blood type and to check for the presence of any previous infections. They will also be used to check for low iron levels (anemia) and Rh antibodies. Later in the pregnancy, blood tests for diabetes will be done along with other tests if problems develop.  Urine tests to check for infections, diabetes, or protein in the urine.  An ultrasound to confirm the proper growth  and development of the baby.  An amniocentesis to check for possible genetic problems.  Fetal screens for spina bifida and Down syndrome.  You may need other tests to make sure you and the baby are doing well.  HIV (human immunodeficiency virus) testing. Routine prenatal testing includes screening for HIV, unless you choose not to have this test. HOME CARE INSTRUCTIONS  Medicines  Follow your health care provider's instructions regarding medicine use. Specific medicines may be either safe or unsafe to take during pregnancy.  Take your prenatal vitamins as directed.  If you develop constipation, try taking a stool softener if your health care provider approves. Diet  Eat regular, well-balanced meals. Choose a variety of foods, such as meat or vegetable-based protein, fish, milk and low-fat dairy products, vegetables, fruits, and whole grain breads and cereals. Your health care provider will help you determine the amount of weight gain that is right for you.  Avoid raw meat and uncooked cheese. These carry germs that can cause birth defects in the baby.  Eating four or five small meals rather than three large meals a day may help relieve nausea and vomiting. If you start to feel nauseous, eating a few soda crackers can be helpful. Drinking liquids between meals instead of during meals also seems to help nausea and vomiting.  If you develop constipation, eat more high-fiber foods, such as fresh vegetables or fruit and whole grains. Drink enough fluids to keep your urine clear or pale yellow. Activity and Exercise  Exercise only as directed by your health care provider. Exercising will help you:  Control your weight.  Stay in shape.  Be prepared for labor and delivery.  Experiencing pain or cramping in the lower abdomen or low back is a good sign that you should stop exercising. Check with your health care provider before continuing normal exercises.  Try to avoid standing for long  periods of time. Move your legs often if you must stand in one place for a long time.  Avoid heavy lifting.  Wear low-heeled shoes, and practice good posture.  You may continue to have sex unless your health care provider directs you otherwise. Relief of Pain or Discomfort  Wear a good support bra for breast tenderness.   Take warm sitz baths to soothe any pain or discomfort caused by hemorrhoids. Use hemorrhoid cream if your health care provider approves.   Rest with your legs elevated if you have leg cramps or low back pain.  If you develop varicose veins in your legs, wear support hose. Elevate your feet for 15 minutes, 3-4 times a day. Limit salt in your diet. Prenatal Care  Schedule your prenatal visits by the twelfth week of pregnancy. They are usually scheduled monthly at first, then more often in the last 2 months before delivery.  Write down your questions. Take them to your prenatal visits.  Keep all your prenatal visits as directed by your   health care provider. Safety  Wear your seat belt at all times when driving.  Make a list of emergency phone numbers, including numbers for family, friends, the hospital, and police and fire departments. General Tips  Ask your health care provider for a referral to a local prenatal education class. Begin classes no later than at the beginning of month 6 of your pregnancy.  Ask for help if you have counseling or nutritional needs during pregnancy. Your health care provider can offer advice or refer you to specialists for help with various needs.  Do not use hot tubs, steam rooms, or saunas.  Do not douche or use tampons or scented sanitary pads.  Do not cross your legs for long periods of time.  Avoid cat litter boxes and soil used by cats. These carry germs that can cause birth defects in the baby and possibly loss of the fetus by miscarriage or stillbirth.  Avoid all smoking, herbs, alcohol, and medicines not prescribed by  your health care provider. Chemicals in these affect the formation and growth of the baby.  Do not use any tobacco products, including cigarettes, chewing tobacco, and electronic cigarettes. If you need help quitting, ask your health care provider. You may receive counseling support and other resources to help you quit.  Schedule a dentist appointment. At home, brush your teeth with a soft toothbrush and be gentle when you floss. SEEK MEDICAL CARE IF:   You have dizziness.  You have mild pelvic cramps, pelvic pressure, or nagging pain in the abdominal area.  You have persistent nausea, vomiting, or diarrhea.  You have a bad smelling vaginal discharge.  You have pain with urination.  You notice increased swelling in your face, hands, legs, or ankles. SEEK IMMEDIATE MEDICAL CARE IF:   You have a fever.  You are leaking fluid from your vagina.  You have spotting or bleeding from your vagina.  You have severe abdominal cramping or pain.  You have rapid weight gain or loss.  You vomit blood or material that looks like coffee grounds.  You are exposed to German measles and have never had them.  You are exposed to fifth disease or chickenpox.  You develop a severe headache.  You have shortness of breath.  You have any kind of trauma, such as from a fall or a car accident.   This information is not intended to replace advice given to you by your health care provider. Make sure you discuss any questions you have with your health care provider.   Document Released: 02/20/2001 Document Revised: 03/19/2014 Document Reviewed: 01/06/2013 Elsevier Interactive Patient Education 2016 [employer]  

## 2015-03-22 NOTE — Telephone Encounter (Signed)
Telephone call to patient regarding positive chlamydia culture, patient notified.  Patient was not treated at time of visit.  Rx routed to pharmacy per protocol.  Instructed patient to notify her partner for treatment and to abstain from sex for seven days post treatment.  Report of treatment faxed to health department.

## 2015-03-22 NOTE — Progress Notes (Signed)
History   Chief Complaint:  No chief complaint on file.   Melanie Cain is  20 y.o. G2P1001 Patient's last menstrual period was 02/16/2015 (exact date).. Patient is here for follow up of quantitative HCG and ongoing surveillance of pregnancy status.   She is 2068w6d weeks gestation  by LMP.    Since her last visit, the patient is without new complaint.  Notified of Pos Chlamydia today. Has not filled Rx yet.   ROS Abdomin Pain: Mild cramping Vaginal bleeding: none now.   Passage of clots or tissue: None Dizziness: None  Her previous Quantitative HCG values are:  Results for Melanie CashCOOPER, Melanie (MRN [id number]) as of 03/30/2015 09:34  Ref. Range 03/19/2015 01:40  HCG, Beta Chain, Quant, S Latest Ref Range: <5 mIU/mL 1309 (H)    Physical Exam   LMP 02/16/2015 (Exact Date) Constitutional: Well-nourished female in no apparent distress. No pallor Neuro: Alert and oriented 4 Cardiovascular: Normal rate Respiratory: Normal effort and rate Abdomen: Soft, nontender Gynecological Exam: examination not indicated  Labs: Results for orders placed or performed in visit on 03/22/15 (from the past 24 hour(s))  hCG, quantitative, pregnancy   Collection Time: 03/22/15  9:45 AM  Result Value Ref Range   hCG, Beta Chain, Quant, S 4399 (H) <5 mIU/mL    Ultrasound Studies:   [place] Ob Comp Less 14 Wks  03/19/2015  CLINICAL DATA:  Acute onset of generalized abdominal pain. Initial encounter. EXAM: OBSTETRIC <14 WK US AND TRANSVAGINAL OB US TECHNIQUE: Both transabdominal and transvaginal ultrasound examinations were performed for complete evaluation of the gestation as well as the maternal uterus, adnexal regions, and pelvic cul-de-sac. Transvaginal technique was performed to assess early pregnancy. COMPARISON:  Pelvic ultrasound performed 09/07/2014 FINDINGS: Intrauterine gestational sac: Visualized/normal in shape. Yolk sac:  No Embryo:  No MSD: 2.8 mm   5 w   0  d Subchorionic hemorrhage: A small amount  of subchorionic hemorrhage is noted, measuring 1.0 x 0.2 x 0.8 cm. Maternal uterus/adnexae: There also appears to be a tiny endometrial cyst, measuring 0.4 cm. The ovaries are unremarkable in appearance. The right ovary measures 3.2 x 1.3 x 2.1 cm, while the left ovary measures 4.0 x 2.6 x 3.3 cm. No suspicious adnexal masses are seen; there is no evidence for ovarian torsion. No free fluid is seen within the pelvic cul-de-sac. IMPRESSION: 1. Single intrauterine gestational sac noted, with a mean sac diameter of 3 mm, corresponding to a gestational age of [redacted] weeks 0 days. This matches the gestational age of [redacted] weeks 3 days by LMP, reflecting an estimated delivery of November 23, 2015. No yolk sac or embryo is yet seen, within normal limits. 2. Small amount of subchorionic hemorrhage noted. 3. Tiny endometrial cyst noted. Electronically Signed   By: Roanna RaiderJeffery  Chang M.D.   On: 03/19/2015 02:44   [place] Ob Transvaginal  03/19/2015  CLINICAL DATA:  Acute onset of generalized abdominal pain. Initial encounter. EXAM: OBSTETRIC <14 WK US AND TRANSVAGINAL OB US TECHNIQUE: Both transabdominal and transvaginal ultrasound examinations were performed for complete evaluation of the gestation as well as the maternal uterus, adnexal regions, and pelvic cul-de-sac. Transvaginal technique was performed to assess early pregnancy. COMPARISON:  Pelvic ultrasound performed 09/07/2014 FINDINGS: Intrauterine gestational sac: Visualized/normal in shape. Yolk sac:  No Embryo:  No MSD: 2.8 mm   5 w   0  d Subchorionic hemorrhage: A small amount of subchorionic hemorrhage is noted, measuring 1.0 x 0.2 x 0.8 cm. Maternal uterus/adnexae: There also  appears to be a tiny endometrial cyst, measuring 0.4 cm. The ovaries are unremarkable in appearance. The right ovary measures 3.2 x 1.3 x 2.1 cm, while the left ovary measures 4.0 x 2.6 x 3.3 cm. No suspicious adnexal masses are seen; there is no evidence for ovarian torsion. No free fluid is seen within  the pelvic cul-de-sac. IMPRESSION: 1. Single intrauterine gestational sac noted, with a mean sac diameter of 3 mm, corresponding to a gestational age of [redacted] weeks 0 days. This matches the gestational age of [redacted] weeks 3 days by LMP, reflecting an estimated delivery of November 23, 2015. No yolk sac or embryo is yet seen, within normal limits. 2. Small amount of subchorionic hemorrhage noted. 3. Tiny endometrial cyst noted. Electronically Signed   By: Roanna Raider M.D.   On: 03/19/2015 02:44    MAU course/MDM: Quantitative hCG ordered  Abd pain in early pregnancy with normal rise in Quant and hemodynamically stable. Pain possible R/T Chlamydia infection.   Assessment: [redacted]w[redacted]d weeks gestation w/ appropriate rise in Quant Pos Chlamydia  Plan: Discharge home in stable condition. SAB/ectopic precautions PID precautions Partner need Tx, Then no IC x at least one week. Rec condoms.    Viability [place] in one week MAU in emergencies.   Medication List       This list is accurate as of: 03/22/15 12:14 PM.  Always use your most recent med list.               acetaminophen 325 MG tablet  Commonly known as:  TYLENOL  Take 650 mg by mouth every 6 (six) hours as needed for mild pain.     albuterol 108 (90 Base) MCG/ACT inhaler  Commonly known as:  PROVENTIL HFA;VENTOLIN HFA  Inhale 2 puffs into the lungs 2 (two) times daily as needed for wheezing or shortness of breath.     azithromycin 500 MG tablet  Commonly known as:  ZITHROMAX  Take two tablets by mouth once.     Fluticasone-Salmeterol 500-50 MCG/DOSE Aepb  Commonly known as:  ADVAIR  Inhale 1 puff into the lungs 2 (two) times daily.     ipratropium 0.06 % nasal spray  Commonly known as:  ATROVENT  Place 2 sprays into both nostrils 4 (four) times daily.     levalbuterol 0.63 MG/3ML nebulizer solution  Commonly known as:  XOPENEX  Take 0.63 mg by nebulization 2 (two) times daily as needed for wheezing or shortness of breath.      metroNIDAZOLE 500 MG tablet  Commonly known as:  FLAGYL  Take 1 tablet (500 mg total) by mouth 2 (two) times daily.     triamcinolone 0.1 % cream : eucerin Crea  Apply 1 application topically 2 (two) times daily.        Dorathy Kinsman, CNM 03/22/2015, 12:14 PM  2/3

## 2015-03-27 ENCOUNTER — Inpatient Hospital Stay (HOSPITAL_COMMUNITY)
Admission: AD | Admit: 2015-03-27 | Discharge: 2015-03-28 | Disposition: A | Discharge status: Home / Self Care | Payer: Medicaid Other | Source: Ambulatory Visit | Attending: Obstetrics & Gynecology | Admitting: Obstetrics & Gynecology

## 2015-03-27 ENCOUNTER — Encounter (HOSPITAL_COMMUNITY): Payer: Self-pay

## 2015-03-27 DIAGNOSIS — O26899 Other specified pregnancy related conditions, unspecified trimester: Secondary | ICD-10-CM

## 2015-03-27 DIAGNOSIS — Z3491 Encounter for supervision of normal pregnancy, unspecified, first trimester: Secondary | ICD-10-CM

## 2015-03-27 DIAGNOSIS — R109 Unspecified abdominal pain: Secondary | ICD-10-CM | POA: Insufficient documentation

## 2015-03-27 DIAGNOSIS — O26891 Other specified pregnancy related conditions, first trimester: Secondary | ICD-10-CM | POA: Insufficient documentation

## 2015-03-27 DIAGNOSIS — O9989 Other specified diseases and conditions complicating pregnancy, childbirth and the puerperium: Secondary | ICD-10-CM | POA: Diagnosis not present

## 2015-03-27 DIAGNOSIS — Z3A01 Less than 8 weeks gestation of pregnancy: Secondary | ICD-10-CM | POA: Insufficient documentation

## 2015-03-27 DIAGNOSIS — L309 Dermatitis, unspecified: Secondary | ICD-10-CM | POA: Diagnosis not present

## 2015-03-27 LAB — CBC
HEMATOCRIT: 37.5 % (ref 36.0–46.0)
HEMOGLOBIN: 12.2 g/dL (ref 12.0–15.0)
MCH: 23.6 pg — ABNORMAL LOW (ref 26.0–34.0)
MCHC: 32.5 g/dL (ref 30.0–36.0)
MCV: 72.7 fL — ABNORMAL LOW (ref 78.0–100.0)
Platelets: 298 10*3/uL (ref 150–400)
RBC: 5.16 MIL/uL — AB (ref 3.87–5.11)
RDW: 17.9 % — ABNORMAL HIGH (ref 11.5–15.5)
WBC: 5.9 10*3/uL (ref 4.0–10.5)

## 2015-03-27 LAB — URINALYSIS, ROUTINE W REFLEX MICROSCOPIC
Bilirubin Urine: NEGATIVE
Glucose, UA: NEGATIVE mg/dL
Hgb urine dipstick: NEGATIVE
Ketones, ur: NEGATIVE mg/dL
LEUKOCYTES UA: NEGATIVE
NITRITE: NEGATIVE
Protein, ur: NEGATIVE mg/dL
pH: 6 (ref 5.0–8.0)

## 2015-03-27 NOTE — MAU Note (Signed)
Pt c/o lower abdominal cramping for past week. Was evaluated in MAU and treated for BV recently. Still taking medication for it. Denies vag bleeding.

## 2015-03-27 NOTE — MAU Provider Note (Signed)
History     CSN: [id number]  Arrival date and time: 03/27/15 2319   First Provider Initiated Contact with Patient 03/27/15 2353         Chief Complaint  Patient presents with  . Abdominal Cramping    Burna Cash is a 20 y.o. G2P1001 at [redacted]w[redacted]d who presents with abdominal pain.   Abdominal Cramping This is a new problem. The current episode started 1 to 4 weeks ago. The problem occurs intermittently (constant today). The problem has been gradually worsening. The pain is located in the suprapubic region and LLQ. The pain is at a severity of 10/10. The quality of the pain is cramping. The abdominal pain does not radiate. Pertinent negatives include no anorexia, constipation, diarrhea, dysuria, fever, frequency, nausea or vomiting. Nothing aggravates the pain. The pain is relieved by nothing. She has tried nothing for the symptoms. There is no history of abdominal surgery. Treated for chlamydia 5 days ago     OB History    Gravida Para Term Preterm AB TAB SAB Ectopic Multiple Living   2 1 1  0 0 0 0 0 0 1      Past Medical History  Diagnosis Date  . Allergy   . Asthma   . Menometrorrhagia   . MRSA (methicillin resistant staph aureus) culture positive 2012    culture from abscess    Past Surgical History  Procedure Laterality Date  . No past surgeries      Family History  Problem Relation Age of Onset  . Asthma Other   . Cancer Other   . Asthma Mother   . Hypertension Mother   . Asthma Father   . Asthma Sister     2 sisters  . Cancer Maternal Grandmother     breast cancer  . Hearing loss Maternal Grandmother     Social History  Substance Use Topics  . Smoking status: Never Smoker   . Smokeless tobacco: Never Used  . Alcohol Use: No    Allergies:  Allergies  Allergen Reactions  . Montelukast Sodium Other (See Comments)    Hallucinations  . Prednisone Other (See Comments)    Swollen glands   . Tessalon [Benzonatate]     Prescriptions prior to  admission  Medication Sig Dispense Refill Last Dose  . ipratropium (ATROVENT) 0.06 % nasal spray Place 2 sprays into both nostrils 4 (four) times daily. 15 mL 1 Past Month at Unknown time  . metroNIDAZOLE (FLAGYL) 500 MG tablet Take 1 tablet (500 mg total) by mouth 2 (two) times daily. 14 tablet 0 03/27/2015 at Unknown time  . acetaminophen (TYLENOL) 325 MG tablet Take 650 mg by mouth every 6 (six) hours as needed for mild pain.   More than a month at Unknown time  . albuterol (PROVENTIL HFA;VENTOLIN HFA) 108 (90 BASE) MCG/ACT inhaler Inhale 2 puffs into the lungs 2 (two) times daily as needed for wheezing or shortness of breath.    More than a month at Unknown time  . azithromycin (ZITHROMAX) 500 MG tablet Take two tablets by mouth once. 2 tablet 0   . Fluticasone-Salmeterol (ADVAIR) 500-50 MCG/DOSE AEPB Inhale 1 puff into the lungs 2 (two) times daily.    More than a month at Unknown time  . levalbuterol (XOPENEX) 0.63 MG/3ML nebulizer solution Take 0.63 mg by nebulization 2 (two) times daily as needed for wheezing or shortness of breath.    More than a month at Unknown time  . Prenat w/o A Vit-FeFum-FePo-FA (CONCEPT  OB) 130-92.4-1 MG CAPS Take 1 tablet by mouth daily. 30 capsule 12 Unknown at Unknown time  . Triamcinolone Acetonide (TRIAMCINOLONE 0.1 % CREAM : EUCERIN) CREA Apply 1 application topically 2 (two) times daily. 1 each 1     Review of Systems  Constitutional: Negative for fever.  Gastrointestinal: Positive for abdominal pain. Negative for nausea, vomiting, diarrhea, constipation and anorexia.  Genitourinary: Negative.  Negative for dysuria and frequency.  Skin: Positive for itching and rash.   Physical Exam   Blood pressure 114/64, pulse 65, temperature 98.2 F (36.8 C), temperature source Oral, resp. rate 16, height 5\' 5"  (1.651 m), weight 135 lb 6.4 oz (61.417 kg), last menstrual period 02/16/2015, SpO2 100 %, not currently breastfeeding.  Physical Exam  Nursing note and  vitals reviewed. Constitutional: She is oriented to person, place, and time. She appears well-developed and well-nourished. No distress.  HENT:  Head: Normocephalic and atraumatic.  Eyes: Conjunctivae are normal. Right eye exhibits no discharge. Left eye exhibits no discharge. No scleral icterus.  Neck: Normal range of motion.  Cardiovascular: Normal rate, regular rhythm and normal heart sounds.   No murmur heard. Respiratory: Effort normal and breath sounds normal. No respiratory distress. She has no wheezes.  GI: Soft. Bowel sounds are normal. She exhibits no distension. There is tenderness in the left lower quadrant. There is no rebound and no guarding.  Genitourinary:  Cervix closed  Neurological: She is alert and oriented to person, place, and time.  Skin: Skin is warm and dry. Rash noted. Rash is macular (lower abdomen & left thigh). She is not diaphoretic.  Psychiatric: She has a normal mood and affect. Her behavior is normal. Judgment and thought content normal.    MAU Course  Procedures Results for orders placed or performed during the hospital encounter of 03/27/15 (from the past 24 hour(s))  Urinalysis, Routine w reflex microscopic ([facility])     Status: Abnormal   Collection Time: 03/27/15 11:25 PM  Result Value Ref Range   Color, Urine YELLOW YELLOW   APPearance CLEAR CLEAR   Specific Gravity, Urine >1.030 (H) 1.005 - 1.030   pH 6.0 5.0 - 8.0   Glucose, UA NEGATIVE NEGATIVE mg/dL   Hgb urine dipstick NEGATIVE NEGATIVE   Bilirubin Urine NEGATIVE NEGATIVE   Ketones, ur NEGATIVE NEGATIVE mg/dL   Protein, ur NEGATIVE NEGATIVE mg/dL   Nitrite NEGATIVE NEGATIVE   Leukocytes, UA NEGATIVE NEGATIVE  CBC     Status: Abnormal   Collection Time: 03/27/15 11:35 PM  Result Value Ref Range   WBC 5.9 4.0 - 10.5 K/uL   RBC 5.16 (H) 3.87 - 5.11 MIL/uL   Hemoglobin 12.2 12.0 - 15.0 g/dL   HCT 16.137.5 09.636.0 - 04.546.0 %   MCV 72.7 (L) 78.0 - 100.0 fL   MCH 23.6 (L) 26.0 - 34.0 pg    MCHC 32.5 30.0 - 36.0 g/dL   RDW 40.917.9 (H) 81.111.5 - 91.415.5 %   Platelets 298 150 - 400 K/uL  hCG, quantitative, pregnancy     Status: Abnormal   Collection Time: 03/27/15 11:35 PM  Result Value Ref Range   hCG, Beta Chain, Quant, S 23161 (H) <5 mIU/mL   [place] Ob Transvaginal  03/28/2015  CLINICAL DATA:  20 year old pregnant female with abdominal pain EXAM: TRANSVAGINAL OB ULTRASOUND TECHNIQUE: Transvaginal ultrasound was performed for complete evaluation of the gestation as well as the maternal uterus, adnexal regions, and pelvic cul-de-sac. COMPARISON:  Ultrasound dated 03/19/2015 FINDINGS: There is a single  intrauterine gestational sac containing a yolk sac. No fetal pole identified at time. The estimated gestational age based on mean sac diameter of 12 mm is 6 weeks, 0 days. The estimated gestational age based on prior ultrasound is 6 weeks, 2 days. No subchorionic hemorrhage identified. The maternal ovaries appear unremarkable. No free fluid within the pelvis. IMPRESSION: Single intrauterine gestational sac containing yolk sac. Electronically Signed   By: Elgie Collard M.D.   On: 03/28/2015 01:20     MDM B positive Per patient & notes in Epic, pt was prescribed triamcinolone cream for eczema last week. Pt states pharmacy couldn't filling b/c something was missing in prescription. I will prescribe again tonight.  Tylenol 1gm po given. Pt reports improvement in symptom Ultrasound shows IUGS with yolk sac Assessment and Plan  A: 1. Abdominal pain in pregnancy   2. Eczema   3. Normal IUP (intrauterine pregnancy) on prenatal ultrasound, first trimester    P: Discharge home Rx triamcinolone cream Call [facility] to start prenatal care Take tylenol PRN pain Increase fluid intake  Judeth Horn, NP  03/27/2015, 11:42 PM

## 2015-03-28 ENCOUNTER — Inpatient Hospital Stay (HOSPITAL_COMMUNITY): Payer: Medicaid Other

## 2015-03-28 DIAGNOSIS — O9989 Other specified diseases and conditions complicating pregnancy, childbirth and the puerperium: Secondary | ICD-10-CM

## 2015-03-28 DIAGNOSIS — L309 Dermatitis, unspecified: Secondary | ICD-10-CM

## 2015-03-28 DIAGNOSIS — R109 Unspecified abdominal pain: Secondary | ICD-10-CM

## 2015-03-28 LAB — HCG, QUANTITATIVE, PREGNANCY: HCG, BETA CHAIN, QUANT, S: 23161 m[IU]/mL — AB (ref <5)

## 2015-03-28 MED ORDER — ACETAMINOPHEN 500 MG PO TABS
1000.0000 mg | ORAL_TABLET | Freq: Once | ORAL | Status: AC
  Administered 2015-03-28: 1000 mg via ORAL
  Filled 2015-03-28: qty 2

## 2015-03-28 MED ORDER — TRIAMCINOLONE ACETONIDE 0.1 % EX CREA: 1.0000 "application " | TOPICAL_CREAM | Freq: Two times a day (BID) | CUTANEOUS | Status: DC

## 2015-03-28 NOTE — Discharge Instructions (Signed)

## 2015-03-30 ENCOUNTER — Ambulatory Visit (HOSPITAL_COMMUNITY): Payer: Medicaid Other

## 2015-03-31 ENCOUNTER — Ambulatory Visit (HOSPITAL_COMMUNITY): Payer: Medicaid Other

## 2015-04-14 ENCOUNTER — Encounter (HOSPITAL_COMMUNITY): Payer: Self-pay | Admitting: *Deleted

## 2015-04-14 ENCOUNTER — Inpatient Hospital Stay (HOSPITAL_COMMUNITY)
Admission: AD | Admit: 2015-04-14 | Discharge: 2015-04-14 | Disposition: A | Discharge status: Home / Self Care | Payer: Medicaid Other | Source: Ambulatory Visit | Attending: Obstetrics & Gynecology | Admitting: Obstetrics & Gynecology

## 2015-04-14 DIAGNOSIS — Z3A08 8 weeks gestation of pregnancy: Secondary | ICD-10-CM | POA: Diagnosis not present

## 2015-04-14 DIAGNOSIS — O26851 Spotting complicating pregnancy, first trimester: Secondary | ICD-10-CM | POA: Diagnosis present

## 2015-04-14 DIAGNOSIS — O209 Hemorrhage in early pregnancy, unspecified: Secondary | ICD-10-CM | POA: Diagnosis not present

## 2015-04-14 LAB — URINE MICROSCOPIC-ADD ON: Bacteria, UA: NONE SEEN

## 2015-04-14 LAB — URINALYSIS, ROUTINE W REFLEX MICROSCOPIC
BILIRUBIN URINE: NEGATIVE
GLUCOSE, UA: NEGATIVE mg/dL
KETONES UR: 15 mg/dL — AB
Leukocytes, UA: NEGATIVE
Nitrite: NEGATIVE
PH: 6 (ref 5.0–8.0)
Protein, ur: NEGATIVE mg/dL
Specific Gravity, Urine: 1.025 (ref 1.005–1.030)

## 2015-04-14 NOTE — Discharge Instructions (Signed)

## 2015-04-14 NOTE — MAU Provider Note (Signed)
Chief Complaint: Vaginal Bleeding   First Provider Initiated Contact with Patient 04/14/15 1807        SUBJECTIVE  HPI  Melanie Cain is a 20 y.o. G2P1001 at [redacted]w[redacted]d by LMP who presents to maternity admissions reporting brown bleeding/spotting. Some mild cramps at times. She denies vaginal itching/burning, urinary symptoms, h/a, dizziness, n/v, or fever/chills.    Has been seen here for bleeding before. Cultures revealed + Chlamydia for which she was treated. US showed a single gestational sac with yolk sac, thereby effectively ruling out ectopic pregnancy.   Has an appointment for interview at [facility] this week. Did not call them.    Past Medical History  Diagnosis Date  . Allergy   . Asthma   . Menometrorrhagia   . MRSA (methicillin resistant staph aureus) culture positive 2012    culture from abscess   Past Surgical History  Procedure Laterality Date  . No past surgeries     Social History   Social History  . Marital Status: Single    Spouse Name: N/A  . Number of Children: N/A  . Years of Education: N/A   Occupational History  . Not on file.   Social History Main Topics  . Smoking status: Never Smoker   . Smokeless tobacco: Never Used  . Alcohol Use: No  . Drug Use: No  . Sexual Activity: Yes    Birth Control/ Protection: None   Other Topics Concern  . Not on file   Social History Narrative   No current facility-administered medications on file prior to encounter.   Current Outpatient Prescriptions on File Prior to Encounter  Medication Sig Dispense Refill  . albuterol (PROVENTIL HFA;VENTOLIN HFA) 108 (90 BASE) MCG/ACT inhaler Inhale 2 puffs into the lungs 2 (two) times daily as needed for wheezing or shortness of breath.     . Fluticasone-Salmeterol (ADVAIR) 500-50 MCG/DOSE AEPB Inhale 1 puff into the lungs 2 (two) times daily.     Marland Kitchen levalbuterol (XOPENEX) 0.63 MG/3ML nebulizer solution Take 0.63 mg by nebulization 2 (two) times daily as needed for  wheezing or shortness of breath.     Burnis Medin w/o A Vit-FeFum-FePo-FA (CONCEPT OB) 130-92.4-1 MG CAPS Take 1 tablet by mouth daily. 30 capsule 12  . triamcinolone cream (KENALOG) 0.1 % Apply 1 application topically 2 (two) times daily. 30 g 1  . ipratropium (ATROVENT) 0.06 % nasal spray Place 2 sprays into both nostrils 4 (four) times daily. (Patient not taking: Reported on 04/14/2015) 15 mL 1  . metroNIDAZOLE (FLAGYL) 500 MG tablet Take 1 tablet (500 mg total) by mouth 2 (two) times daily. (Patient not taking: Reported on 04/14/2015) 14 tablet 0   Allergies  Allergen Reactions  . Montelukast Sodium Other (See Comments)    Hallucinations  . Prednisone Other (See Comments)    Swollen glands   . Tessalon [Benzonatate]     I have reviewed patient's Past Medical Hx, Surgical Hx, Family Hx, Social Hx, medications and allergies.   ROS:  Review of Systems Review of Systems  Constitutional: Negative for fever and chills.  Gastrointestinal: Negative for nausea, vomiting, abdominal pain, diarrhea and constipation.  Genitourinary: Negative for dysuria. Positive for spotting of brown blood Musculoskeletal: Negative for back pain.  Neurological: Negative for dizziness and weakness.    Physical Exam  Patient Vitals for the past 24 hrs:  BP Temp Pulse Resp  04/14/15 1708 120/67 mmHg 98.2 F (36.8 C) 77 18  Physical Exam   Constitutional: Well-developed, well-nourished female  in no acute distress.  Cardiovascular: normal rate Respiratory: normal effort GI: Abd soft, non-tender. Pos BS x 4 MS: Extremities nontender, no edema, normal ROM Neurologic: Alert and oriented x 4.  GU: Neg CVAT.        Scant brown discharge, uterus nontender, appropriate for gestational age  83 78 by doppler!  Bedside US done >>  Single, round gestational sac                                     Single fetus noted with movement and good cardiac motion                                     CRL [redacted]w[redacted]d                                      FHR 160s                                     Adnexae not studied   LAB RESULTS No results found for this or any previous visit (from the past 24 hour(s)).  --/--/B POS (01/07 0140) Results for Melanie Cain (MRN [id number]) as of 04/14/2015 18:30  Ref. Range 03/19/2015 01:40  ABO/RH(D) Unknown B POS   IMAGING US Ob Comp Less 14 Wks  03/19/2015  CLINICAL DATA:  Acute onset of generalized abdominal pain. Initial encounter. EXAM: OBSTETRIC <14 WK [place] AND TRANSVAGINAL OB US TECHNIQUE: Both transabdominal and transvaginal ultrasound examinations were performed for complete evaluation of the gestation as well as the maternal uterus, adnexal regions, and pelvic cul-de-sac. Transvaginal technique was performed to assess early pregnancy. COMPARISON:  Pelvic ultrasound performed 09/07/2014 FINDINGS: Intrauterine gestational sac: Visualized/normal in shape. Yolk sac:  No Embryo:  No MSD: 2.8 mm   5 w   0  d Subchorionic hemorrhage: A small amount of subchorionic hemorrhage is noted, measuring 1.0 x 0.2 x 0.8 cm. Maternal uterus/adnexae: There also appears to be a tiny endometrial cyst, measuring 0.4 cm. The ovaries are unremarkable in appearance. The right ovary measures 3.2 x 1.3 x 2.1 cm, while the left ovary measures 4.0 x 2.6 x 3.3 cm. No suspicious adnexal masses are seen; there is no evidence for ovarian torsion. No free fluid is seen within the pelvic cul-de-sac. IMPRESSION: 1. Single intrauterine gestational sac noted, with a mean sac diameter of 3 mm, corresponding to a gestational age of [redacted] weeks 0 days. This matches the gestational age of [redacted] weeks 3 days by LMP, reflecting an estimated delivery of November 23, 2015. No yolk sac or embryo is yet seen, within normal limits. 2. Small amount of subchorionic hemorrhage noted. 3. Tiny endometrial cyst noted. Electronically Signed   By: Roanna Raider M.D.   On: 03/19/2015 02:44   US Ob Transvaginal  03/28/2015  CLINICAL DATA:  20 year old  pregnant female with abdominal pain EXAM: TRANSVAGINAL OB ULTRASOUND TECHNIQUE: Transvaginal ultrasound was performed for complete evaluation of the gestation as well as the maternal uterus, adnexal regions, and pelvic cul-de-sac. COMPARISON:  Ultrasound dated 03/19/2015 FINDINGS: There is a single intrauterine gestational sac containing a yolk sac. No fetal pole identified at time. The estimated  gestational age based on mean sac diameter of 12 mm is 6 weeks, 0 days. The estimated gestational age based on prior ultrasound is 6 weeks, 2 days. No subchorionic hemorrhage identified. The maternal ovaries appear unremarkable. No free fluid within the pelvis. IMPRESSION: Single intrauterine gestational sac containing yolk sac. Electronically Signed   By: Elgie Collard M.D.   On: 03/28/2015 01:20   US Ob Transvaginal  03/19/2015  CLINICAL DATA:  Acute onset of generalized abdominal pain. Initial encounter. EXAM: OBSTETRIC <14 WK [place] AND TRANSVAGINAL OB US TECHNIQUE: Both transabdominal and transvaginal ultrasound examinations were performed for complete evaluation of the gestation as well as the maternal uterus, adnexal regions, and pelvic cul-de-sac. Transvaginal technique was performed to assess early pregnancy. COMPARISON:  Pelvic ultrasound performed 09/07/2014 FINDINGS: Intrauterine gestational sac: Visualized/normal in shape. Yolk sac:  No Embryo:  No MSD: 2.8 mm   5 w   0  d Subchorionic hemorrhage: A small amount of subchorionic hemorrhage is noted, measuring 1.0 x 0.2 x 0.8 cm. Maternal uterus/adnexae: There also appears to be a tiny endometrial cyst, measuring 0.4 cm. The ovaries are unremarkable in appearance. The right ovary measures 3.2 x 1.3 x 2.1 cm, while the left ovary measures 4.0 x 2.6 x 3.3 cm. No suspicious adnexal masses are seen; there is no evidence for ovarian torsion. No free fluid is seen within the pelvic cul-de-sac. IMPRESSION: 1. Single intrauterine gestational sac noted, with a mean sac  diameter of 3 mm, corresponding to a gestational age of [redacted] weeks 0 days. This matches the gestational age of [redacted] weeks 3 days by LMP, reflecting an estimated delivery of November 23, 2015. No yolk sac or embryo is yet seen, within normal limits. 2. Small amount of subchorionic hemorrhage noted. 3. Tiny endometrial cyst noted. Electronically Signed   By: Roanna Raider M.D.   On: 03/19/2015 02:44    MAU Management/MDM: Per history, this bleeding probably represents residual old blood from several weeks ago There has been no new red bleeding Minimal cramping. There has been good development and appropriate growth of fetus, which now has an audible FHR per doppler. Crown rump length is consistent with gestational age.  ASSESSMENT SIUP at [redacted]w[redacted]d  Bleeding in early pregnancy, no new bleeding Viable single intrauterine fetus History of chlamydia in January. States she and FOB were both treated (FOB now in [place])  PLAN Discharge home Keep appointment this week for interview at [facility] Told pt to tell [facility] she needs TOC at new OB exam    Medication List    ASK your doctor about these medications        albuterol 108 (90 Base) MCG/ACT inhaler  Commonly known as:  PROVENTIL HFA;VENTOLIN HFA  Inhale 2 puffs into the lungs 2 (two) times daily as needed for wheezing or shortness of breath.     CONCEPT OB 130-92.4-1 MG Caps  Take 1 tablet by mouth daily.     Fluticasone-Salmeterol 500-50 MCG/DOSE Aepb  Commonly known as:  ADVAIR  Inhale 1 puff into the lungs 2 (two) times daily.     ipratropium 0.06 % nasal spray  Commonly known as:  ATROVENT  Place 2 sprays into both nostrils 4 (four) times daily.     levalbuterol 0.63 MG/3ML nebulizer solution  Commonly known as:  XOPENEX  Take 0.63 mg by nebulization 2 (two) times daily as needed for wheezing or shortness of breath.     metroNIDAZOLE 500 MG tablet  Commonly known as:  FLAGYL  Take 1 tablet (500 mg total) by mouth 2 (two) times  daily.     triamcinolone cream 0.1 %  Commonly known as:  KENALOG  Apply 1 application topically 2 (two) times daily.         Wynelle Bourgeois CNM, MSN Certified Nurse-Midwife 04/14/2015  6:19 PM

## 2015-04-14 NOTE — MAU Note (Signed)
Pt presents to MAU with complaints of vaginal spotting that started yesterday brown in color. Lower abdominal cramping.

## 2015-04-21 LAB — OB RESULTS CONSOLE HEPATITIS B SURFACE ANTIGEN: HEP B S AG: NEGATIVE

## 2015-04-21 LAB — OB RESULTS CONSOLE ABO/RH: RH TYPE: POSITIVE

## 2015-04-21 LAB — OB RESULTS CONSOLE RPR: RPR: NONREACTIVE

## 2015-04-21 LAB — OB RESULTS CONSOLE ANTIBODY SCREEN: ANTIBODY SCREEN: NEGATIVE

## 2015-04-21 LAB — OB RESULTS CONSOLE RUBELLA ANTIBODY, IGM: Rubella: IMMUNE

## 2015-06-17 ENCOUNTER — Encounter: Payer: Self-pay | Admitting: Emergency Medicine

## 2015-06-17 ENCOUNTER — Emergency Department
Admission: EM | Admit: 2015-06-17 | Discharge: 2015-06-17 | Disposition: A | Discharge status: Home / Self Care | Payer: Medicaid Other | Attending: Emergency Medicine | Admitting: Emergency Medicine

## 2015-06-17 ENCOUNTER — Emergency Department: Payer: Medicaid Other

## 2015-06-17 DIAGNOSIS — O2342 Unspecified infection of urinary tract in pregnancy, second trimester: Secondary | ICD-10-CM | POA: Insufficient documentation

## 2015-06-17 DIAGNOSIS — Z79899 Other long term (current) drug therapy: Secondary | ICD-10-CM | POA: Diagnosis not present

## 2015-06-17 DIAGNOSIS — Z3A17 17 weeks gestation of pregnancy: Secondary | ICD-10-CM | POA: Diagnosis not present

## 2015-06-17 DIAGNOSIS — J45909 Unspecified asthma, uncomplicated: Secondary | ICD-10-CM | POA: Diagnosis not present

## 2015-06-17 DIAGNOSIS — R102 Pelvic and perineal pain: Secondary | ICD-10-CM | POA: Diagnosis present

## 2015-06-17 LAB — COMPREHENSIVE METABOLIC PANEL
ALBUMIN: 3.7 g/dL (ref 3.5–5.0)
ALK PHOS: 47 U/L (ref 38–126)
ALT: 11 U/L — ABNORMAL LOW (ref 14–54)
ANION GAP: 4 — AB (ref 5–15)
AST: 17 U/L (ref 15–41)
BUN: 8 mg/dL (ref 6–20)
CO2: 24 mmol/L (ref 22–32)
Calcium: 9.1 mg/dL (ref 8.9–10.3)
Chloride: 105 mmol/L (ref 101–111)
Creatinine, Ser: 0.45 mg/dL (ref 0.44–1.00)
GFR calc Af Amer: >60 mL/min (ref >60)
GFR calc non Af Amer: >60 mL/min (ref >60)
GLUCOSE: 76 mg/dL (ref 65–99)
POTASSIUM: 3.6 mmol/L (ref 3.5–5.1)
SODIUM: 133 mmol/L — AB (ref 135–145)
Total Bilirubin: 2 mg/dL — ABNORMAL HIGH (ref 0.3–1.2)
Total Protein: 7.4 g/dL (ref 6.5–8.1)

## 2015-06-17 LAB — URINALYSIS COMPLETE WITH MICROSCOPIC (ARMC ONLY)
Bilirubin Urine: NEGATIVE
Glucose, UA: NEGATIVE mg/dL
HGB URINE DIPSTICK: NEGATIVE
Nitrite: NEGATIVE
PH: 6 (ref 5.0–8.0)
PROTEIN: NEGATIVE mg/dL
Specific Gravity, Urine: 1.021 (ref 1.005–1.030)

## 2015-06-17 LAB — WET PREP, GENITAL
Clue Cells Wet Prep HPF POC: NONE SEEN
SPERM: NONE SEEN
Trich, Wet Prep: NONE SEEN

## 2015-06-17 LAB — CBC WITH DIFFERENTIAL/PLATELET
BASOS ABS: 0.1 10*3/uL (ref 0–0.1)
BASOS PCT: 1 %
EOS ABS: 0.1 10*3/uL (ref 0–0.7)
Eosinophils Relative: 1 %
HCT: 35.8 % (ref 35.0–47.0)
HEMOGLOBIN: 11.6 g/dL — AB (ref 12.0–16.0)
Lymphocytes Relative: 26 %
Lymphs Abs: 1.7 10*3/uL (ref 1.0–3.6)
MCH: 23.7 pg — ABNORMAL LOW (ref 26.0–34.0)
MCHC: 32.3 g/dL (ref 32.0–36.0)
MCV: 73.4 fL — ABNORMAL LOW (ref 80.0–100.0)
MONO ABS: 0.3 10*3/uL (ref 0.2–0.9)
Monocytes Relative: 5 %
NEUTROS ABS: 4.5 10*3/uL (ref 1.4–6.5)
NEUTROS PCT: 67 %
Platelets: 216 10*3/uL (ref 150–440)
RBC: 4.89 MIL/uL (ref 3.80–5.20)
RDW: 18 % — AB (ref 11.5–14.5)
WBC: 6.6 10*3/uL (ref 3.6–11.0)

## 2015-06-17 LAB — LIPASE, BLOOD: LIPASE: 18 U/L (ref 11–51)

## 2015-06-17 LAB — CHLAMYDIA/NGC RT PCR (ARMC ONLY)
Chlamydia Tr: DETECTED — AB
N gonorrhoeae: NOT DETECTED

## 2015-06-17 MED ORDER — CEPHALEXIN 500 MG PO CAPS
500.0000 mg | ORAL_CAPSULE | Freq: Once | ORAL | Status: AC
  Administered 2015-06-17: 500 mg via ORAL
  Filled 2015-06-17 (×2): qty 1

## 2015-06-17 MED ORDER — CEPHALEXIN 500 MG PO CAPS: 500.0000 mg | ORAL_CAPSULE | Freq: Two times a day (BID) | ORAL | Status: DC

## 2015-06-17 NOTE — ED Provider Notes (Signed)
[facility] Emergency Department Provider Note   ____________________________________________  Time seen: Approximately 3:20 PM I have reviewed the triage vital signs and the triage nursing note.  HISTORY  Chief Complaint Abdominal Cramping   Historian Patient and sister  HPI Melanie Cain is a 20 y.o. female here from work after complaining of pelvic pain. She reports she is [redacted] weeks pregnant and has had prenatal care, but has not had ultrasound yet. She reports this is her second pregnancy with one daughter. She states she has normal vaginal discharge, nothing new or different. She denies any vaginal bleeding. She denies nausea with this pregnancy. No other recent illnesses. Nothing makes it worse or better    Past Medical History  Diagnosis Date  . Allergy   . Asthma   . Menometrorrhagia   . MRSA (methicillin resistant staph aureus) culture positive 2012    culture from abscess    Patient Active Problem List   Diagnosis Date Noted  . Anemia - Hemoglobin 7.5 on 07/21/13 07/21/2013  . Vaginal delivery 07/20/2013    Past Surgical History  Procedure Laterality Date  . No past surgeries      Current Outpatient Rx  Name  Route  Sig  Dispense  Refill  . albuterol (PROVENTIL HFA;VENTOLIN HFA) 108 (90 BASE) MCG/ACT inhaler   Inhalation   Inhale 2 puffs into the lungs 2 (two) times daily as needed for wheezing or shortness of breath.          . cephALEXin (KEFLEX) 500 MG capsule   Oral   Take 1 capsule (500 mg total) by mouth 2 (two) times daily.   14 capsule   0   . Fluticasone-Salmeterol (ADVAIR) 500-50 MCG/DOSE AEPB   Inhalation   Inhale 1 puff into the lungs 2 (two) times daily.          Marland Kitchen. levalbuterol (XOPENEX) 0.63 MG/3ML nebulizer solution   Nebulization   Take 0.63 mg by nebulization 2 (two) times daily as needed for wheezing or shortness of breath.          Burnis Medin. Prenat w/o A Vit-FeFum-FePo-FA (CONCEPT OB) 130-92.4-1 MG  CAPS   Oral   Take 1 tablet by mouth daily.   30 capsule   12   . triamcinolone cream (KENALOG) 0.1 %   Topical   Apply 1 application topically 2 (two) times daily.   30 g   1     Allergies Montelukast sodium; Prednisone; and Tessalon  Family History  Problem Relation Age of Onset  . Asthma Other   . Cancer Other   . Asthma Mother   . Hypertension Mother   . Asthma Father   . Asthma Sister     2 sisters  . Cancer Maternal Grandmother     breast cancer  . Hearing loss Maternal Grandmother     Social History Social History  Substance Use Topics  . Smoking status: Never Smoker   . Smokeless tobacco: Never Used  . Alcohol Use: No    Review of Systems  Constitutional: Negative for fever. Eyes: Negative for visual changes. ENT: Negative for sore throat. Cardiovascular: Negative for chest pain. Respiratory: Negative for shortness of breath. Gastrointestinal: Negative for  vomiting and diarrhea. Genitourinary: Negative for dysuria. Musculoskeletal: Negative for back pain. Skin: Negative for rash. Neurological: Negative for headache. 10 point Review of Systems otherwise negative ____________________________________________   PHYSICAL EXAM:  VITAL SIGNS: ED Triage Vitals  Enc Vitals Group     BP  06/17/15 1247 124/64 mmHg     Pulse Rate 06/17/15 1247 65     Resp 06/17/15 1247 18     Temp 06/17/15 1247 98.3 F (36.8 C)     Temp Source 06/17/15 1247 Oral     SpO2 06/17/15 1247 100 %     Weight 06/17/15 1247 141 lb (63.957 kg)     Height 06/17/15 1247  (1.651 m)     Head Cir --      Peak Flow --      Pain Score 06/17/15 1247 8     Pain Loc --      Pain Edu? --      Excl. in GC? --      Constitutional: Alert and oriented. Well appearing and in no distress. HEENT   Head: Normocephalic and atraumatic.      Eyes: Conjunctivae are normal. PERRL. Normal extraocular movements.      Ears:         Nose: No congestion/rhinnorhea.   Mouth/Throat:  Mucous membranes are moist.   Neck: No stridor. Cardiovascular/Chest: Normal rate, regular rhythm.  No murmurs, rubs, or gallops. Respiratory: Normal respiratory effort without tachypnea nor retractions. Breath sounds are clear and equal bilaterally. No wheezes/rales/rhonchi. Gastrointestinal: Soft. No distention, no guarding, no rebound. Uterine fundus below the umbilicus consistent with stated dates. Mild pelvic tenderness palpation. Genitourinary/rectal: Minimal vaginal discharge, no cervical motion tenderness or adnexal mass. Musculoskeletal: Nontender with normal range of motion in all extremities. No joint effusions.  No lower extremity tenderness.  No edema. Neurologic:  Normal speech and language. No gross or focal neurologic deficits are appreciated. Skin:  Skin is warm, dry and intact. No rash noted. Psychiatric: Mood and affect are normal. Speech and behavior are normal. Patient exhibits appropriate insight and judgment.  ____________________________________________   EKG I, Governor Rooks, MD, the attending physician have personally viewed and interpreted all ECGs.  None ____________________________________________  LABS (pertinent positives/negatives)  Wet prep yeast, many white blood cells with no clue cells and negative for Trichomonas White blood count 6.6, hemoglobin 11.6 and platelet count 216 Comprehensive metabolic panel without significant abnormality Urinalysis trace of ascites, 0-5 red blood cells, 0-5 white blood cells and rare bacteria  ____________________________________________  RADIOLOGY All Xrays were viewed by me. Imaging interpreted by Radiologist.  US pelvis:  FINDINGS: Number of Fetuses: 1  Heart Rate: 155 bpm  Presentation: Cephalic  Placental Location: Posterior  Amniotic Fluid (Subjective): Within normal limits. Largest pocket 4.5 cm  BPD: 3.9 cm 17 w 6 d  MATERNAL FINDINGS:  Cervix: Appears closed.  Uterus/Adnexae:  Remainder of uterus unremarkable.  LEFT ovary normal appearance.  Prominent vessels in the RIGHT adnexal/parametrial region, nonspecific.  IMPRESSION: Single live intrauterine gestation as above.  No definite acute abnormalities.  This exam is performed on an emergent basis and does not comprehensively evaluate fetal size, dating, or anatomy; follow-up complete OB US should be considered if further fetal assessment is warranted. __________________________________________  PROCEDURES  Procedure(s) performed: None  Critical Care performed: None  ____________________________________________   ED COURSE / ASSESSMENT AND PLAN  Pertinent labs & imaging results that were available during my care of the patient were reviewed by me and considered in my medical decision making (see chart for details).   This patient is overall well-appearing, but reports not having had a confirmed IUP by ultrasound.  In terms of abdominal discomfort, there is not much abdominal pain on palpation, and I don't have a high suspicion for  intra-abdominal emergency.  Her urinalysis is possibly consistent with UTI, and given that she's pregnant, I'm good air on the side of treating with Keflex. Urine culture was sent.  Clinically no evidence of cervicitis or PID on exam. Gonorrhea and chlamydia are not back yet.  Ultrasound confirms live IUP at 17 weeks consistent with her dates.    CONSULTATIONS:   None   Patient / Family / Caregiver informed of clinical course, medical decision-making process, and agree with plan.   I discussed return precautions, follow-up instructions, and discharged instructions with patient and/or family.   ___________________________________________   FINAL CLINICAL IMPRESSION(S) / ED DIAGNOSES   Final diagnoses:  UTI in pregnancy, second trimester              Note: This dictation was prepared with Dragon dictation. Any transcriptional errors that  result from this process are unintentional   Governor Rooks, MD 06/17/15 [phone]

## 2015-06-17 NOTE — ED Notes (Signed)
MD at bedside. 

## 2015-06-17 NOTE — ED Notes (Signed)
C/o cramping and headache.  [redacted] wks pregnant

## 2015-06-17 NOTE — Discharge Instructions (Signed)
You were evaluated for abdominal discomfort, and you are found to have a urinary tract infection and are being treated with antibiotic called Keflex.  The rest of the exam and evaluation are reassuring in the emergency department today.  Return to the emergency department for any worsening abdominal pain, vaginal bleeding back pain, black or bloody stools, vomiting, dizziness or passing out, fever, or any other symptoms concerning to you.   Pregnancy and Urinary Tract Infection A urinary tract infection (UTI) is a bacterial infection of the urinary tract. Infection of the urinary tract can include the ureters, kidneys (pyelonephritis), bladder (cystitis), and urethra (urethritis). All pregnant women should be screened for bacteria in the urinary tract. Identifying and treating a UTI will decrease the risk of preterm labor and developing more serious infections in both the mother and baby. CAUSES Bacteria germs cause almost all UTIs.  RISK FACTORS Many factors can increase your chances of getting a UTI during pregnancy. These include:  Having a short urethra.  Poor toilet and hygiene habits.  Sexual intercourse.  Blockage of urine along the urinary tract.  Problems with the pelvic muscles or nerves.  Diabetes.  Obesity.  Bladder problems after having several children.  Previous history of UTI. SIGNS AND SYMPTOMS   Pain, burning, or a stinging feeling when urinating.  Suddenly feeling the need to urinate right away (urgency).  Loss of bladder control (urinary incontinence).  Frequent urination, more than is common with pregnancy.  Lower abdominal or back discomfort.  Cloudy urine.  Blood in the urine (hematuria).  Fever. When the kidneys are infected, the symptoms may be:  Back pain.  Flank pain on the right side more so than the left.  Fever.  Chills.  Nausea.  Vomiting. DIAGNOSIS  A urinary tract infection is usually diagnosed through urine tests.  Additional tests and procedures are sometimes done. These may include:  Ultrasound exam of the kidneys, ureters, bladder, and urethra.  Looking in the bladder with a lighted tube (cystoscopy). TREATMENT Typically, UTIs can be treated with antibiotic medicines.  HOME CARE INSTRUCTIONS   Only take over-the-counter or prescription medicines as directed by your health care provider. If you were prescribed antibiotics, take them as directed. Finish them even if you start to feel better.  Drink enough fluids to keep your urine clear or pale yellow.  Do not have sexual intercourse until the infection is gone and your health care provider says it is okay.  Make sure you are tested for UTIs throughout your pregnancy. These infections often come back. Preventing a UTI in the Future  Practice good toilet habits. Always wipe from front to back. Use the tissue only once.  Do not hold your urine. Empty your bladder as soon as possible when the urge comes.  Do not douche or use deodorant sprays.  Wash with soap and warm water around the genital area and the anus.  Empty your bladder before and after sexual intercourse.  Wear underwear with a cotton crotch.  Avoid caffeine and carbonated drinks. They can irritate the bladder.  Drink cranberry juice or take cranberry pills. This may decrease the risk of getting a UTI.  Do not drink alcohol.  Keep all your appointments and tests as scheduled. SEEK MEDICAL CARE IF:   Your symptoms get worse.  You are still having fevers 2 or more days after treatment begins.  You have a rash.  You feel that you are having problems with medicines prescribed.  You have abnormal vaginal  discharge. SEEK IMMEDIATE MEDICAL CARE IF:   You have back or flank pain.  You have chills.  You have blood in your urine.  You have nausea and vomiting.  You have contractions of your uterus.  You have a gush of fluid from the vagina. MAKE SURE  YOU:  Understand these instructions.   Will watch your condition.   Will get help right away if you are not doing well or get worse.    This information is not intended to replace advice given to you by your health care provider. Make sure you discuss any questions you have with your health care provider.   Document Released: 06/23/2010 Document Revised: 12/17/2012 Document Reviewed: 09/25/2012 Elsevier Interactive Patient Education 2016 Elsevier Inc. Abdominal Pain During Pregnancy Abdominal pain is common in pregnancy. Most of the time, it does not cause harm. There are many causes of abdominal pain. Some causes are more serious than others. Some of the causes of abdominal pain in pregnancy are easily diagnosed. Occasionally, the diagnosis takes time to understand. Other times, the cause is not determined. Abdominal pain can be a sign that something is very wrong with the pregnancy, or the pain may have nothing to do with the pregnancy at all. For this reason, always tell your health care provider if you have any abdominal discomfort. HOME CARE INSTRUCTIONS  Monitor your abdominal pain for any changes. The following actions may help to alleviate any discomfort you are experiencing:  Do not have sexual intercourse or put anything in your vagina until your symptoms go away completely.  Get plenty of rest until your pain improves.  Drink clear fluids if you feel nauseous. Avoid solid food as long as you are uncomfortable or nauseous.  Only take over-the-counter or prescription medicine as directed by your health care provider.  Keep all follow-up appointments with your health care provider. SEEK IMMEDIATE MEDICAL CARE IF:  You are bleeding, leaking fluid, or passing tissue from the vagina.  You have increasing pain or cramping.  You have persistent vomiting.  You have painful or bloody urination.  You have a fever.  You notice a decrease in your baby's movements.  You have  extreme weakness or feel faint.  You have shortness of breath, with or without abdominal pain.  You develop a severe headache with abdominal pain.  You have abnormal vaginal discharge with abdominal pain.  You have persistent diarrhea.  You have abdominal pain that continues even after rest, or gets worse. MAKE SURE YOU:   Understand these instructions.  Will watch your condition.  Will get help right away if you are not doing well or get worse.   This information is not intended to replace advice given to you by your health care provider. Make sure you discuss any questions you have with your health care provider.   Document Released: 02/26/2005 Document Revised: 12/17/2012 Document Reviewed: 09/25/2012 Elsevier Interactive Patient Education [employer]2016 Elsevier Inc.

## 2015-06-17 NOTE — ED Notes (Signed)
Pt reports she has low abd pain .  Pt is approx [redacted] weeks pregnant.  Pt denies vag bleeding.  No vag d/c.  g2p1a0  Sx for 2-3 days.

## 2015-06-17 NOTE — ED Notes (Signed)
[facility] profile suggest urine drug screen "upon request only" spoke with Manager Tyra Estate manager/land agent(store manager) and she states that it can be filed under [employer] but no drug screen is needed.

## 2015-06-20 ENCOUNTER — Telehealth: Payer: Self-pay | Admitting: Emergency Medicine

## 2015-06-20 LAB — URINE CULTURE: Culture: 8000 — AB

## 2015-06-20 NOTE — ED Notes (Signed)
Called patient to inform of positive chlamydia test.  Was not treated in the ED.  Per dr Cyril Loosenkinner can call in azithromycin 1 gram po to pt preferred pharmacy.  Left message asking pt to call me.

## 2015-06-21 ENCOUNTER — Inpatient Hospital Stay (HOSPITAL_COMMUNITY)
Admission: AD | Admit: 2015-06-21 | Discharge: 2015-06-21 | Disposition: A | Discharge status: Home / Self Care | Payer: Medicaid Other | Source: Ambulatory Visit | Attending: Obstetrics and Gynecology | Admitting: Obstetrics and Gynecology

## 2015-06-21 ENCOUNTER — Encounter (HOSPITAL_COMMUNITY): Payer: Self-pay

## 2015-06-21 DIAGNOSIS — R109 Unspecified abdominal pain: Secondary | ICD-10-CM | POA: Diagnosis present

## 2015-06-21 DIAGNOSIS — A568 Sexually transmitted chlamydial infection of other sites: Secondary | ICD-10-CM | POA: Diagnosis not present

## 2015-06-21 DIAGNOSIS — N390 Urinary tract infection, site not specified: Secondary | ICD-10-CM | POA: Insufficient documentation

## 2015-06-21 DIAGNOSIS — R1030 Lower abdominal pain, unspecified: Secondary | ICD-10-CM | POA: Insufficient documentation

## 2015-06-21 LAB — URINALYSIS, ROUTINE W REFLEX MICROSCOPIC
BILIRUBIN URINE: NEGATIVE
Glucose, UA: NEGATIVE mg/dL
HGB URINE DIPSTICK: NEGATIVE
KETONES UR: NEGATIVE mg/dL
Leukocytes, UA: NEGATIVE
Nitrite: NEGATIVE
PROTEIN: NEGATIVE mg/dL
Specific Gravity, Urine: 1.01 (ref 1.005–1.030)
pH: 6.5 (ref 5.0–8.0)

## 2015-06-21 NOTE — MAU Note (Signed)
Notified provider that patient is here.   

## 2015-06-21 NOTE — MAU Note (Signed)
Pt C/O lower abd pain - L side since last week, was seen @ hospital in [place] last Friday, dx'd with UTI.  Then received phone call saying she was positive for chlamydia.  Tested negative before @ [facility] & was negative.  Vomited on Sunday, none today.  Denies vaginal bleeding or discharge.

## 2015-06-21 NOTE — MAU Note (Signed)
Delay explained to patient. Patient states she wants to leave. Patient left AMA.

## 2015-06-21 NOTE — Progress Notes (Signed)
ED Antimicrobial Stewardship Positive Culture Follow Up   Melanie Cain is an 20 y.o. female who presented to [facility] on 06/21/2015 with a chief complaint of  Chief Complaint  Patient presents with  . Abdominal Pain    Recent Results (from the past 720 hour(s))  Urine culture     Status: Abnormal   Collection Time: 06/17/15  1:43 PM  Result Value Ref Range Status   Specimen Description URINE, RANDOM  Final   Special Requests NONE  Final   Culture (A)  Final    8,000 COLONIES/mL GROUP B STREP(S.AGALACTIAE)ISOLATED WITH MIXED BACTERIAL ORGANISMS Virtually 100% of S. agalactiae (Group B) strains are susceptible to Penicillin.  For Penicillin-allergic patients, Erythromycin (85-95% sensitive) and Clindamycin (80% sensitive) are drugs of choice. Contact microbiology lab to request sensitivities if  needed within 7 days. CRITICAL RESULT CALLED TO, READ BACK BY AND VERIFIED WITH: LIZ GANNON AT 1340 ON 06/20/15. CTJ    Report Status 06/20/2015 FINAL  Final  Chlamydia/NGC rt PCR     Status: Abnormal   Collection Time: 06/17/15  4:32 PM  Result Value Ref Range Status   Specimen source GC/Chlam ENDOCERVICAL  Final   Chlamydia Tr DETECTED (A) NOT DETECTED Corrected    Comment: CORRECTED ON 04/07 AT 1826: PREVIOUSLY REPORTED AS POSITIVE   N gonorrhoeae NOT DETECTED NOT DETECTED Corrected    Comment: (NOTE) 100  This methodology has not been evaluated in pregnant women or in 200  patients with a history of hysterectomy. 300 400  This methodology will not be performed on patients less than 614  years of age. CORRECTED ON 04/07 AT 1826: PREVIOUSLY REPORTED AS NEGATIVE   Wet prep, genital     Status: Abnormal   Collection Time: 06/17/15  4:32 PM  Result Value Ref Range Status   Yeast Wet Prep HPF POC PRESENT (A) NONE SEEN Final   Trich, Wet Prep NONE SEEN NONE SEEN Final   Clue Cells Wet Prep HPF POC NONE SEEN NONE SEEN Final   WBC, Wet Prep HPF POC MANY (A) NONE SEEN Final   Sperm  NONE SEEN  Final    Patient treated with Keflex 500 mg PO BID x 7 days for UTI.  Therapy is appropriate based on culture/sensitivity data. [facility]. Per notes, patient positive for chlamydia and azithromycin called in to pharmacy.     Melanie Cain 06/21/2015, 4:06 PM

## 2015-10-10 ENCOUNTER — Emergency Department (HOSPITAL_COMMUNITY)
Admission: EM | Admit: 2015-10-10 | Discharge: 2015-10-10 | Disposition: A | Discharge status: Home / Self Care | Payer: Medicaid Other | Attending: Emergency Medicine | Admitting: Emergency Medicine

## 2015-10-10 ENCOUNTER — Encounter (HOSPITAL_COMMUNITY): Payer: Self-pay | Admitting: Emergency Medicine

## 2015-10-10 DIAGNOSIS — Z3A33 33 weeks gestation of pregnancy: Secondary | ICD-10-CM | POA: Insufficient documentation

## 2015-10-10 DIAGNOSIS — J45909 Unspecified asthma, uncomplicated: Secondary | ICD-10-CM | POA: Diagnosis not present

## 2015-10-10 DIAGNOSIS — O99513 Diseases of the respiratory system complicating pregnancy, third trimester: Secondary | ICD-10-CM | POA: Diagnosis not present

## 2015-10-10 DIAGNOSIS — J069 Acute upper respiratory infection, unspecified: Secondary | ICD-10-CM | POA: Insufficient documentation

## 2015-10-10 MED ORDER — LEVALBUTEROL HCL 0.63 MG/3ML IN NEBU: 0.6300 mg | INHALATION_SOLUTION | Freq: Two times a day (BID) | RESPIRATORY_TRACT | 5 refills | Status: DC | PRN

## 2015-10-10 MED ORDER — ALBUTEROL SULFATE (2.5 MG/3ML) 0.083% IN NEBU
5.0000 mg | INHALATION_SOLUTION | Freq: Once | RESPIRATORY_TRACT | Status: AC
  Administered 2015-10-10: 5 mg via RESPIRATORY_TRACT
  Filled 2015-10-10: qty 6

## 2015-10-10 MED ORDER — ALBUTEROL SULFATE HFA 108 (90 BASE) MCG/ACT IN AERS
2.0000 | INHALATION_SPRAY | Freq: Once | RESPIRATORY_TRACT | Status: AC
  Administered 2015-10-10: 2 via RESPIRATORY_TRACT
  Filled 2015-10-10: qty 6.7

## 2015-10-10 MED ORDER — ACETAMINOPHEN 500 MG PO TABS
1000.0000 mg | ORAL_TABLET | Freq: Once | ORAL | Status: AC
  Administered 2015-10-10: 1000 mg via ORAL
  Filled 2015-10-10: qty 2

## 2015-10-10 NOTE — ED Provider Notes (Signed)
[facility]-EMERGENCY DEPT Provider Note   CSN: [id number] Arrival date & time: 10/10/15  3568  First Provider Contact:  None       History   Chief Complaint Chief Complaint  Patient presents with  . Asthma    HPI Melanie Cain is a 20 y.o. female.  HPI   Pt with hx allergy, asthma who is [redacted] weeks pregnant presents with three days of URI symptoms including nasal congestion, rhinorrhea, sore throat, itchy ears, left eye draining and red, dry cough, SOB, chest soreness with coughing.  Daughter was recently sick with similar symptoms.  Has had regular prenatal care with [facility] and things are going well - feeling normal fetal movement, no abdominal pain, no bleeding or fluid from the vagina.    Past Medical History:  Diagnosis Date  . Allergy   . Asthma   . Menometrorrhagia   . MRSA (methicillin resistant staph aureus) culture positive 2012   culture from abscess    Patient Active Problem List   Diagnosis Date Noted  . Anemia - Hemoglobin 7.5 on 07/21/13 07/21/2013  . Vaginal delivery 07/20/2013    Past Surgical History:  Procedure Laterality Date  . NO PAST SURGERIES      OB History    Gravida Para Term Preterm AB Living   2 1 1  0 0 1   SAB TAB Ectopic Multiple Live Births   0 0 0 0         Home Medications    Prior to Admission medications   Medication Sig Start Date End Date Taking? Authorizing Provider  albuterol (PROVENTIL HFA;VENTOLIN HFA) 108 (90 BASE) MCG/ACT inhaler Inhale 2 puffs into the lungs 2 (two) times daily as needed for wheezing or shortness of breath.     Historical Provider, MD  cephALEXin (KEFLEX) 500 MG capsule Take 1 capsule (500 mg total) by mouth 2 (two) times daily. 06/17/15   Governor Rooks, MD  Fluticasone-Salmeterol (ADVAIR) 500-50 MCG/DOSE AEPB Inhale 1 puff into the lungs 2 (two) times daily.     Historical Provider, MD  levalbuterol Pauline Aus) 0.63 MG/3ML nebulizer solution Take 0.63 mg by nebulization 2 (two) times daily  as needed for wheezing or shortness of breath.     Historical Provider, MD  Prenat w/o A Vit-FeFum-FePo-FA (CONCEPT OB) 130-92.4-1 MG CAPS Take 1 tablet by mouth daily. 03/22/15   Dorathy Kinsman, CNM    Family History Family History  Problem Relation Age of Onset  . Asthma Other   . Cancer Other   . Asthma Mother   . Hypertension Mother   . Asthma Father   . Asthma Sister     2 sisters  . Cancer Maternal Grandmother     breast cancer  . Hearing loss Maternal Grandmother     Social History Social History  Substance Use Topics  . Smoking status: Never Smoker  . Smokeless tobacco: Never Used  . Alcohol use No     Allergies   Montelukast sodium; Prednisone; and Tessalon [benzonatate]   Review of Systems Review of Systems  All other systems reviewed and are negative.    Physical Exam Updated Vital Signs BP 127/76   Pulse 83   Temp 97.9 F (36.6 C) (Oral)   Resp 22   Ht 5\' 6"  (1.676 m)   Wt 71.7 kg   LMP 02/16/2015 (Exact Date)   SpO2 100%   BMI 25.50 kg/m   Physical Exam  Constitutional: She appears well-developed and well-nourished. No distress.  HENT:  Head: Normocephalic and atraumatic.  Right Ear: Tympanic membrane and ear canal normal.  Left Ear: Tympanic membrane and ear canal normal.  Nose: Mucosal edema and rhinorrhea present.  Mouth/Throat: Oropharynx is clear and moist. No oropharyngeal exudate, posterior oropharyngeal edema, posterior oropharyngeal erythema or tonsillar abscesses.  Eyes: Conjunctivae are normal.  Left eye slightly injected with clear tearing   Neck: Neck supple.  Cardiovascular: Normal rate and regular rhythm.   Pulmonary/Chest: Effort normal and breath sounds normal. No respiratory distress. She has no wheezes. She has no rales.  Abdominal: Soft.  gravid  Musculoskeletal: She exhibits no edema.  Neurological: She is alert.  Skin: She is not diaphoretic.  Nursing note and vitals reviewed.    ED Treatments / Results   Labs (all labs ordered are listed, but only abnormal results are displayed) Labs Reviewed - No data to display  EKG  EKG Interpretation None       Radiology No results found.  Procedures Procedures (including critical care time)  Medications Ordered in ED Medications  acetaminophen (TYLENOL) tablet 1,000 mg (1,000 mg Oral Given 10/10/15 1031)  albuterol (PROVENTIL) (2.5 MG/3ML) 0.083% nebulizer solution 5 mg (5 mg Nebulization Given 10/10/15 1032)  albuterol (PROVENTIL HFA;VENTOLIN HFA) 108 (90 Base) MCG/ACT inhaler 2 puff (2 puffs Inhalation Given 10/10/15 1138)     Initial Impression / Assessment and Plan / ED Course  I have reviewed the triage vital signs and the nursing notes.  Pertinent labs & imaging results that were available during my care of the patient were reviewed by me and considered in my medical decision making (see chart for details).  Clinical Course   Afebrile, nontoxic patient with constellation of symptoms suggestive of viral syndrome.  No concerning findings on exam.  Increased SOB with hx asthma.  Doubt pneumonia, doubt pulmonary embolism.  Tylenol, albuterol nebs given.  Provided information regarding medication safety in pregnancy and recommended close follow up with OB.  Discharged home with supportive care.  Discussed result, findings, treatment, and follow up  with patient.  Pt given return precautions.  Pt verbalizes understanding and agrees with plan.      Final Clinical Impressions(s) / ED Diagnoses   Final diagnoses:  URI (upper respiratory infection)    New Prescriptions Discharge Medication List as of 10/10/2015 11:17 AM       Trixie Dredge, PA-C 10/10/15 1216    Doug Sou, MD 10/10/15 1754

## 2015-10-10 NOTE — Discharge Instructions (Signed)
Read the information below.  You may return to the Emergency Department at any time for worsening condition or any new symptoms that concern you.   If you develop high fevers that do not resolve with tylenol or ibuprofen, you have difficulty swallowing or breathing, or you are unable to tolerate fluids by mouth, return to the ER for a recheck.    °

## 2015-10-10 NOTE — ED Triage Notes (Signed)
C/o asthma getting worse since Saturday-- dry cough, also has red conjunctiva left eye, with drainage.  Pt is [redacted] weeks pregnant-- has prenatal care.

## 2015-10-11 ENCOUNTER — Emergency Department (HOSPITAL_COMMUNITY): Payer: Medicaid Other

## 2015-10-11 ENCOUNTER — Emergency Department (HOSPITAL_COMMUNITY)
Admission: EM | Admit: 2015-10-11 | Discharge: 2015-10-11 | Disposition: A | Discharge status: Home / Self Care | Payer: Medicaid Other | Attending: Emergency Medicine | Admitting: Emergency Medicine

## 2015-10-11 ENCOUNTER — Encounter (HOSPITAL_COMMUNITY): Payer: Self-pay

## 2015-10-11 DIAGNOSIS — J45901 Unspecified asthma with (acute) exacerbation: Secondary | ICD-10-CM

## 2015-10-11 DIAGNOSIS — R0602 Shortness of breath: Secondary | ICD-10-CM | POA: Diagnosis present

## 2015-10-11 LAB — COMPREHENSIVE METABOLIC PANEL
ALT: 14 U/L (ref 14–54)
AST: 21 U/L (ref 15–41)
Albumin: 2.9 g/dL — ABNORMAL LOW (ref 3.5–5.0)
Alkaline Phosphatase: 114 U/L (ref 38–126)
Anion gap: 8 (ref 5–15)
BUN: <5 mg/dL — ABNORMAL LOW (ref 6–20)
CO2: 20 mmol/L — ABNORMAL LOW (ref 22–32)
Calcium: 9.2 mg/dL (ref 8.9–10.3)
Chloride: 108 mmol/L (ref 101–111)
Creatinine, Ser: 0.62 mg/dL (ref 0.44–1.00)
GFR calc Af Amer: >60 mL/min (ref >60)
GFR calc non Af Amer: >60 mL/min (ref >60)
Glucose, Bld: 90 mg/dL (ref 65–99)
Potassium: 3.4 mmol/L — ABNORMAL LOW (ref 3.5–5.1)
Sodium: 136 mmol/L (ref 135–145)
Total Bilirubin: 1.9 mg/dL — ABNORMAL HIGH (ref 0.3–1.2)
Total Protein: 6.3 g/dL — ABNORMAL LOW (ref 6.5–8.1)

## 2015-10-11 LAB — CBC
HCT: 36.1 % (ref 36.0–46.0)
Hemoglobin: 11.2 g/dL — ABNORMAL LOW (ref 12.0–15.0)
MCH: 23.8 pg — ABNORMAL LOW (ref 26.0–34.0)
MCHC: 31 g/dL (ref 30.0–36.0)
MCV: 76.8 fL — ABNORMAL LOW (ref 78.0–100.0)
Platelets: 199 10*3/uL (ref 150–400)
RBC: 4.7 MIL/uL (ref 3.87–5.11)
RDW: 19.2 % — ABNORMAL HIGH (ref 11.5–15.5)
WBC: 6.6 10*3/uL (ref 4.0–10.5)

## 2015-10-11 MED ORDER — FLUORESCEIN SODIUM 1 MG OP STRP
1.0000 | ORAL_STRIP | Freq: Once | OPHTHALMIC | Status: AC
  Administered 2015-10-11: 1 via OPHTHALMIC
  Filled 2015-10-11: qty 1

## 2015-10-11 MED ORDER — ALBUTEROL SULFATE (2.5 MG/3ML) 0.083% IN NEBU
5.0000 mg | INHALATION_SOLUTION | Freq: Once | RESPIRATORY_TRACT | Status: AC
  Administered 2015-10-11: 5 mg via RESPIRATORY_TRACT
  Filled 2015-10-11: qty 6

## 2015-10-11 MED ORDER — IPRATROPIUM BROMIDE 0.02 % IN SOLN
0.5000 mg | Freq: Once | RESPIRATORY_TRACT | Status: AC
  Administered 2015-10-11: 0.5 mg via RESPIRATORY_TRACT
  Filled 2015-10-11: qty 2.5

## 2015-10-11 MED ORDER — POLYMYXIN B-TRIMETHOPRIM 10000-0.1 UNIT/ML-% OP SOLN
1.0000 [drp] | OPHTHALMIC | Status: DC
  Administered 2015-10-11: 1 [drp] via OPHTHALMIC
  Filled 2015-10-11: qty 10

## 2015-10-11 MED ORDER — TETRACAINE HCL 0.5 % OP SOLN
1.0000 [drp] | Freq: Once | OPHTHALMIC | Status: AC
  Administered 2015-10-11: 1 [drp] via OPHTHALMIC
  Filled 2015-10-11: qty 2

## 2015-10-11 MED ORDER — ONDANSETRON 4 MG PO TBDP
4.0000 mg | ORAL_TABLET | Freq: Once | ORAL | Status: AC
  Administered 2015-10-11: 4 mg via ORAL
  Filled 2015-10-11: qty 1

## 2015-10-11 MED ORDER — BUDESONIDE 180 MCG/ACT IN AEPB
1.0000 | INHALATION_SPRAY | Freq: Once | RESPIRATORY_TRACT | Status: AC
  Administered 2015-10-11: 1 via RESPIRATORY_TRACT
  Filled 2015-10-11: qty 1

## 2015-10-11 MED ORDER — IOPAMIDOL (ISOVUE-370) INJECTION 76%
INTRAVENOUS | Status: AC
  Administered 2015-10-11: 80 mL
  Filled 2015-10-11: qty 100

## 2015-10-11 NOTE — Progress Notes (Signed)
Called to come to [place] for a G2P1 33.6 weeks came in complaining of SOB, cough, pt has asthma. Baby moving well, placed of fetal monitor. Called Dr Sallye Ober informed of pt status, after reactive NST pt may come off monitor and OB cleared.

## 2015-10-11 NOTE — ED Notes (Signed)
Pt very anxious and hyperventilating at this time and HR noted to be 150. Pt reports she is "worried and feels like she can't breathe." Provided reassurance to patient and coached her breathing effort. Placed patient on 2L of O2 via nasal cannula with humidified water despite O2 saturations being 100% in order to comfort the patient, pt reports improvement.

## 2015-10-11 NOTE — ED Triage Notes (Signed)
Pt here for asthma, she reports her nose is congested. She has dry cough, currently [redacted] weeks pregnant. Seen here yesterday for same.

## 2015-10-11 NOTE — ED Provider Notes (Signed)
[facility]-EMERGENCY DEPT Provider Note   CSN: [id number] Arrival date & time: 10/11/15  4098  First Provider Contact:  None       History   Chief Complaint Chief Complaint  Patient presents with  . Shortness of Breath    HPI Melanie Cain is a 20 y.o. female.   Shortness of Breath  This is a new problem. The average episode lasts 2 days. The problem occurs rarely.The current episode started 2 days ago. The problem has been gradually worsening. Associated symptoms include rhinorrhea, cough, chest pain (worse with coughing and pleuritic), vomiting (x1), leg pain (L) and leg swelling (subjective). Pertinent negatives include no fever, no ear pain, no neck pain, no sputum production, no hemoptysis, no wheezing, no syncope, no abdominal pain and no rash. The problem's precipitants include pollens.    Past Medical History:  Diagnosis Date  . Allergy   . Asthma   . Menometrorrhagia   . MRSA (methicillin resistant staph aureus) culture positive 2012   culture from abscess    Patient Active Problem List   Diagnosis Date Noted  . Anemia - Hemoglobin 7.5 on 07/21/13 07/21/2013  . Vaginal delivery 07/20/2013    Past Surgical History:  Procedure Laterality Date  . NO PAST SURGERIES      OB History    Gravida Para Term Preterm AB Living   2 1 1  0 0 1   SAB TAB Ectopic Multiple Live Births   0 0 0 0         Home Medications    Prior to Admission medications   Medication Sig Start Date End Date Taking? Authorizing Provider  albuterol (PROVENTIL HFA;VENTOLIN HFA) 108 (90 BASE) MCG/ACT inhaler Inhale 2 puffs into the lungs 2 (two) times daily as needed for wheezing or shortness of breath.     Historical Provider, MD  cephALEXin (KEFLEX) 500 MG capsule Take 1 capsule (500 mg total) by mouth 2 (two) times daily. 06/17/15   Governor Rooks, MD  ferrous sulfate 325 (65 FE) MG EC tablet Take 325 mg by mouth 3 (three) times daily with meals.    Historical Provider, MD    Fluticasone-Salmeterol (ADVAIR) 500-50 MCG/DOSE AEPB Inhale 1 puff into the lungs 2 (two) times daily.     Historical Provider, MD  levalbuterol Pauline Aus) 0.63 MG/3ML nebulizer solution Take 3 mLs (0.63 mg total) by nebulization 2 (two) times daily as needed for wheezing or shortness of breath. 10/10/15   Trixie Dredge, PA-C  Prenat w/o A Vit-FeFum-FePo-FA (CONCEPT OB) 130-92.4-1 MG CAPS Take 1 tablet by mouth daily. 03/22/15   Dorathy Kinsman, CNM    Family History Family History  Problem Relation Age of Onset  . Asthma Other   . Cancer Other   . Asthma Mother   . Hypertension Mother   . Asthma Father   . Asthma Sister     2 sisters  . Cancer Maternal Grandmother     breast cancer  . Hearing loss Maternal Grandmother     Social History Social History  Substance Use Topics  . Smoking status: Never Smoker  . Smokeless tobacco: Never Used  . Alcohol use No     Allergies   Montelukast sodium; Prednisone; and Tessalon [benzonatate]   Review of Systems Review of Systems  Constitutional: Negative for fever.  HENT: Positive for rhinorrhea. Negative for ear pain.   Respiratory: Positive for cough and shortness of breath. Negative for hemoptysis, sputum production and wheezing.   Cardiovascular: Positive for chest  pain (worse with coughing and pleuritic) and leg swelling (subjective). Negative for syncope.  Gastrointestinal: Positive for vomiting (x1). Negative for abdominal pain.  Musculoskeletal: Negative for neck pain.  Skin: Negative for rash.     Physical Exam Updated Vital Signs BP 124/75 (BP Location: Left Arm)   Pulse 89   Temp 98.4 F (36.9 C) (Oral)   Resp (!) 30   LMP 02/16/2015 (Exact Date)   SpO2 100%   Physical Exam  Constitutional: She is oriented to person, place, and time. She appears well-developed and well-nourished. She appears distressed (mod).  HENT:  Head: Normocephalic and atraumatic.  Eyes: Right eye exhibits no discharge. Left eye exhibits no  discharge. Right conjunctiva is not injected. Left conjunctiva is injected (peripheral).  Neck: Normal range of motion. Neck supple. No tracheal deviation present.  Cardiovascular: Regular rhythm, normal heart sounds and intact distal pulses.   No murmur heard. Tachycardic but not hypoxic on RA  Pulmonary/Chest: Breath sounds normal. No stridor. She is in respiratory distress (tachypnea and diffuse retractions). She has no wheezes. She has no rales.  Abdominal: Soft. She exhibits no distension. There is no tenderness. There is no rebound and no guarding.  Musculoskeletal: She exhibits tenderness (along L calf). She exhibits no edema or deformity.  No calf asymmetry   Neurological: She is alert and oriented to person, place, and time.  Skin: Skin is warm and dry. Capillary refill takes less than 2 seconds. She is not diaphoretic.  Psychiatric: She has a normal mood and affect. Her behavior is normal. Thought content normal.  Nursing note and vitals reviewed.    ED Treatments / Results  Labs (all labs ordered are listed, but only abnormal results are displayed) Labs Reviewed - No data to display  EKG  EKG Interpretation None       Radiology Ct Angio Chest Pe W And/or Wo Contrast  Result Date: 10/11/2015 CLINICAL DATA:  Short of breath and chest pain. Thirty-three weeks pregnant. Abdomen was shielded for the CT. EXAM: CT ANGIOGRAPHY CHEST WITH CONTRAST TECHNIQUE: Multidetector CT imaging of the chest was performed using the standard protocol during bolus administration of intravenous contrast. Multiplanar CT image reconstructions and MIPs were obtained to evaluate the vascular anatomy. CONTRAST:  80 mL Isovue 370 IV COMPARISON:  None. FINDINGS: Negative for pulmonary embolism. Pulmonary arteries normal in caliber. Negative for aortic dissection. Normal thoracic aorta. Normal heart size. Lungs are clear without infiltrate or effusion. Negative for mass lesion. No adenopathy. Normal skeletal  structures. Upper abdomen negative. Review of the MIP images confirms the above findings. IMPRESSION: Negative for pulmonary embolism.  No acute abnormality in the chest. Electronically Signed   By: Marlan Palau M.D.   On: 10/11/2015 10:31    Procedures Procedures (including critical care time)  Medications Ordered in ED Medications  albuterol (PROVENTIL) (2.5 MG/3ML) 0.083% nebulizer solution 5 mg (5 mg Nebulization Given 10/11/15 0847)  ipratropium (ATROVENT) nebulizer solution 0.5 mg (0.5 mg Nebulization Given 10/11/15 0847)  iopamidol (ISOVUE-370) 76 % injection (80 mLs  Contrast Given 10/11/15 1007)  ondansetron (ZOFRAN-ODT) disintegrating tablet 4 mg (4 mg Oral Given 10/11/15 1042)  budesonide (PULMICORT) 180 MCG/ACT inhaler 1 puff (1 puff Inhalation Given 10/11/15 1236)  fluorescein ophthalmic strip 1 strip (1 strip Left Eye Given 10/11/15 1133)  tetracaine (PONTOCAINE) 0.5 % ophthalmic solution 1 drop (1 drop Left Eye Given 10/11/15 1133)     Initial Impression / Assessment and Plan / ED Course  I have reviewed the  triage vital signs and the nursing notes.  Pertinent labs & imaging results that were available during my care of the patient were reviewed by me and considered in my medical decision making (see chart for details).  Clinical Course   20 year old female who presents to the emergency department for a similar complaints as the one she had yesterday. Patient states that she is short of breath, has chest pain worsened with coughing and deep breathing and new onset of left lower extremity pain. Patient is not hypoxic but is tachycardic. She almost [redacted] weeks pregnant. Lung exam does not reveal any wheezing at this time. Concern for PE discussed with patient. Risks and benefits of performing a CT PE discussed. Emphasized protective measures that would be taken to shield her child and patient accepted. CT PE was grossly unremarkable. Patient was given nebulizer treatment since she only was  given an albuterol inhaler during yesterday's visit and was unable to take her nebulizer treatment at home because of one episode of emesis. Responded well and after discussing care plan with on call Ob Gyn from [facility], was given a dose of budesonide and started on it BID. Patient also had findings consistent with conjunctivitis along her left eye without discharge at this time on exam so will be given polymxin drops. She complained of L eye pain as well, so fluorescin was performed, but exam was WNL. Patient has grossly unremarkable labs, not concerning for preeclampsia or HELPP syndrome. Fetal heart rate was 142 on bedside ultrasound and patient had reassuring NST. Importance of follow up with PCP and making it to her regular Ob appointments was discussed. Patient agreed with plan and all questions were answered. VS and patient stable at discharge with no ongoing pain.   Final Clinical Impressions(s) / ED Diagnoses   Final diagnoses:  Asthma exacerbation   New Prescriptions Discharge Medication List as of 10/11/2015 12:41 PM    Discharged with budesonide inhaler, BID 1 puff.   Maretta Bees, MD 10/11/15 2136    Gwyneth Sprout, MD 10/13/15 [phone]

## 2015-10-11 NOTE — ED Notes (Signed)
Rapid [facility]

## 2015-10-11 NOTE — Discharge Instructions (Signed)
Please use nebulizer every 6 hours during the next 24 hours to control shortness of breath.  Start taking budesonide every 12 hours, 1 puff.

## 2015-10-12 ENCOUNTER — Telehealth (HOSPITAL_COMMUNITY): Payer: Self-pay

## 2015-10-12 NOTE — Telephone Encounter (Signed)
Pharmacy calling for xopenex quantity current Rx states 3 ml which is 1 dose.  Current Clinical research associate gave Ok to issue 1 box 72 ml.

## 2015-10-15 ENCOUNTER — Inpatient Hospital Stay (HOSPITAL_COMMUNITY)
Admission: AD | Admit: 2015-10-15 | Discharge: 2015-10-15 | Disposition: A | Discharge status: Home / Self Care | Payer: Medicaid Other | Source: Ambulatory Visit | Attending: Obstetrics and Gynecology | Admitting: Obstetrics and Gynecology

## 2015-10-15 ENCOUNTER — Encounter (HOSPITAL_COMMUNITY): Payer: Self-pay | Admitting: *Deleted

## 2015-10-15 DIAGNOSIS — Z8249 Family history of ischemic heart disease and other diseases of the circulatory system: Secondary | ICD-10-CM | POA: Insufficient documentation

## 2015-10-15 DIAGNOSIS — O368130 Decreased fetal movements, third trimester, not applicable or unspecified: Secondary | ICD-10-CM | POA: Insufficient documentation

## 2015-10-15 DIAGNOSIS — Z79899 Other long term (current) drug therapy: Secondary | ICD-10-CM | POA: Insufficient documentation

## 2015-10-15 DIAGNOSIS — Z3A34 34 weeks gestation of pregnancy: Secondary | ICD-10-CM | POA: Diagnosis not present

## 2015-10-15 DIAGNOSIS — O99513 Diseases of the respiratory system complicating pregnancy, third trimester: Secondary | ICD-10-CM

## 2015-10-15 DIAGNOSIS — J4540 Moderate persistent asthma, uncomplicated: Secondary | ICD-10-CM

## 2015-10-15 DIAGNOSIS — Z825 Family history of asthma and other chronic lower respiratory diseases: Secondary | ICD-10-CM | POA: Diagnosis not present

## 2015-10-15 DIAGNOSIS — J301 Allergic rhinitis due to pollen: Secondary | ICD-10-CM

## 2015-10-15 DIAGNOSIS — Z888 Allergy status to other drugs, medicaments and biological substances status: Secondary | ICD-10-CM | POA: Insufficient documentation

## 2015-10-15 LAB — URINALYSIS, ROUTINE W REFLEX MICROSCOPIC
Bilirubin Urine: NEGATIVE
GLUCOSE, UA: NEGATIVE mg/dL
HGB URINE DIPSTICK: NEGATIVE
Ketones, ur: NEGATIVE mg/dL
Leukocytes, UA: NEGATIVE
Nitrite: NEGATIVE
PH: 6 (ref 5.0–8.0)
PROTEIN: NEGATIVE mg/dL
Specific Gravity, Urine: 1.01 (ref 1.005–1.030)

## 2015-10-15 MED ORDER — CETIRIZINE HCL 10 MG PO TABS: 10.0000 mg | ORAL_TABLET | Freq: Every day | ORAL | 0 refills | Status: DC

## 2015-10-15 MED ORDER — FLUTICASONE PROPIONATE 50 MCG/ACT NA SUSP: 2.0000 | Freq: Every day | NASAL | 0 refills | Status: DC

## 2015-10-15 NOTE — MAU Note (Signed)
Pt reports that her nebulizer and inhalers are not working. Pt has seasonal allergies. Takes Benadryl prn. But has not taken it recently. Pt feels a lot of vaginal pressure and cramping today when she was up walking at work today.

## 2015-10-15 NOTE — MAU Note (Addendum)
Has only felt FM couple times today. Same thing happened Weds. Some cramping and pressure lower abd. Denies LOF or bleeding. Nasal congestion and my chest hurts when I breathe in for a week. Hx asthma. Using nebulizer and inhaler at home and not helping

## 2015-10-15 NOTE — MAU Provider Note (Signed)
History     CSN: [id number]  Arrival date and time: 10/15/15 5573   First Provider Initiated Contact with Patient 10/15/15 2024      Chief Complaint  Patient presents with  . Asthma  . Decreased Fetal Movement   HPI  Melanie Cain is a 20 y.o. G2P1001 at [redacted]w[redacted]d who presents with decreased fetal movement & asthma/allergy complaints. This is her 3rd ED visit in the last week for asthma related issues. Has been taking albuterol & pulmicort. Has not seen PCP in over 1 year; asthma previously being managed through ED.  Currently reports symptoms of cough, nasal congestion, & sinus pressure. Denies chest pain, SOB, fever/chills. Cough is non productive. Denies wheezing but does endorse chest tightness with cough. Reports using her albuterol inhaler & nebulizer tx several times a day for the last week.   Decreased fetal movement today. Reports feeling fetal movement less than 10 times today. Last felt baby move as fetal monitors were applied in MAU. Some lower abdominal pressure that has been present for the last week. Denies vaginal bleeding or LOF. Denies contractions.   OB History    Gravida Para Term Preterm AB Living   2 1 1  0 0 1   SAB TAB Ectopic Multiple Live Births   0 0 0 0 1      Past Medical History:  Diagnosis Date  . Allergy   . Asthma   . Menometrorrhagia   . MRSA (methicillin resistant staph aureus) culture positive 2012   culture from abscess    Past Surgical History:  Procedure Laterality Date  . NO PAST SURGERIES      Family History  Problem Relation Age of Onset  . Asthma Other   . Cancer Other   . Asthma Mother   . Hypertension Mother   . Asthma Father   . Asthma Sister     2 sisters  . Cancer Maternal Grandmother     breast cancer  . Hearing loss Maternal Grandmother     Social History  Substance Use Topics  . Smoking status: Never Smoker  . Smokeless tobacco: Never Used  . Alcohol use No    Allergies:  Allergies  Allergen Reactions   . Montelukast Sodium Other (See Comments)    Hallucinations  . Prednisone Other (See Comments)    Swollen glands   . Tessalon [Benzonatate] Other (See Comments)    unknown    Prescriptions Prior to Admission  Medication Sig Dispense Refill Last Dose  . albuterol (PROVENTIL HFA;VENTOLIN HFA) 108 (90 BASE) MCG/ACT inhaler Inhale 2 puffs into the lungs 2 (two) times daily as needed for wheezing or shortness of breath.    10/15/2015 at Unknown time  . albuterol (PROVENTIL) (2.5 MG/3ML) 0.083% nebulizer solution Take 2.5 mg by nebulization every 6 (six) hours as needed for wheezing or shortness of breath.   10/15/2015 at Unknown time  . cholecalciferol (VITAMIN D) 1000 units tablet Take 1,000 Units by mouth daily.   10/15/2015 at Unknown time  . ferrous sulfate 325 (65 FE) MG EC tablet Take 325 mg by mouth 3 (three) times daily with meals.   10/15/2015 at Unknown time  . levalbuterol (XOPENEX) 0.63 MG/3ML nebulizer solution Take 3 mLs (0.63 mg total) by nebulization 2 (two) times daily as needed for wheezing or shortness of breath. 3 mL 5 10/15/2015 at Unknown time  . Prenat w/o A Vit-FeFum-FePo-FA (CONCEPT OB) 130-92.4-1 MG CAPS Take 1 tablet by mouth daily. 30 capsule 12 10/15/2015  at Unknown time  . cephALEXin (KEFLEX) 500 MG capsule Take 1 capsule (500 mg total) by mouth 2 (two) times daily. 14 capsule 0 06/20/2015 at Unknown time    Review of Systems  Constitutional: Negative for chills and fever.  HENT: Positive for congestion and ear pain. Negative for ear discharge and sore throat.   Eyes: Negative.   Respiratory: Positive for cough. Negative for sputum production, shortness of breath and wheezing.   Cardiovascular: Negative.   Gastrointestinal: Negative.   Genitourinary: Negative.   Neurological: Negative for headaches.   Physical Exam   Blood pressure 112/72, pulse 86, resp. rate 18, height 5\' 6"  (1.676 m), weight 160 lb 12.8 oz (72.9 kg), last menstrual period 02/16/2015, SpO2 99  %.  Physical Exam  Nursing note and vitals reviewed. Constitutional: She is oriented to person, place, and time. She appears well-developed and well-nourished. No distress.  HENT:  Head: Normocephalic and atraumatic.  Right Ear: Tympanic membrane normal.  Left Ear: Tympanic membrane normal.  Nose: Mucosal edema and rhinorrhea present.  Mouth/Throat: Uvula is midline, oropharynx is clear and moist and mucous membranes are normal.  Eyes: Conjunctivae are normal. Right eye exhibits no discharge. Left eye exhibits no discharge. No scleral icterus.  Neck: Normal range of motion. Neck supple.  Cardiovascular: Normal rate, regular rhythm and normal heart sounds.   No murmur heard. Respiratory: Effort normal and breath sounds normal. No respiratory distress. She has no wheezes. She exhibits no tenderness.  GI: Soft. There is no tenderness.  Lymphadenopathy:       Head (right side): No submental, no submandibular and no tonsillar adenopathy present.       Head (left side): No submental, no submandibular and no tonsillar adenopathy present.  Neurological: She is alert and oriented to person, place, and time.  Skin: Skin is warm and dry. She is not diaphoretic.  Psychiatric: She has a normal mood and affect. Her behavior is normal. Judgment and thought content normal.   Dilation: Closed Effacement (%): Thick Exam by:: Judeth Horn NP  Fetal Tracing:  Baseline: 135 Variability: moderate Accelerations: 15x15 Decelerations: none  Toco: UI   MAU Course  Procedures Results for orders placed or performed during the hospital encounter of 10/15/15 (from the past 24 hour(s))  Urinalysis, Routine w reflex microscopic (not at [facility])     Status: None   Collection Time: 10/15/15  7:50 PM  Result Value Ref Range   Color, Urine YELLOW YELLOW   APPearance CLEAR CLEAR   Specific Gravity, Urine 1.010 1.005 - 1.030   pH 6.0 5.0 - 8.0   Glucose, UA NEGATIVE NEGATIVE mg/dL   Hgb urine dipstick  NEGATIVE NEGATIVE   Bilirubin Urine NEGATIVE NEGATIVE   Ketones, ur NEGATIVE NEGATIVE mg/dL   Protein, ur NEGATIVE NEGATIVE mg/dL   Nitrite NEGATIVE NEGATIVE   Leukocytes, UA NEGATIVE NEGATIVE    MDM Reactive fetal tracing Cervix closed VSS, SpO2 100% Pt in no apparent distress & lung sounds clear. Discussed use of OTC meds to treat allergy symptoms, as well as proper use of inhaler. Encouraged patient to make appt with PCP to manage her asthma action plan. S/w Dr. Dion Body regarding pt's assessment, FHT, & VS. Ok to discharge home. Will send msg to pt to f/u at [facility] next week.  Assessment and Plan  A:  1. Allergic rhinitis due to pollen   2. Asthma, moderate persistent, uncomplicated   3. Decreased fetal movement, third trimester, not applicable or unspecified fetus  P: Discharge home Fetal kick counts Rx flonase & zyrtec OTC meds safe in pregnancy list given Continue inhalers as prescribed; discussed proper use of pulmicort Make appt with PCP F/u with OB  Discussed reasons to return to MAU vs ED  Judeth Horn 10/15/2015, 8:23 PM

## 2015-10-15 NOTE — Discharge Instructions (Signed)
Allergies An allergy is when your body reacts to a substance in a way that is not normal. An allergic reaction can happen after you:  Eat something.  Breathe in something.  Touch something. WHAT KINDS OF ALLERGIES ARE THERE? You can be allergic to:  Things that are only around during certain seasons, like molds and pollens.  Foods.  Drugs.  Insects.  Animal dander. WHAT ARE SYMPTOMS OF ALLERGIES?  Puffiness (swelling). This may happen on the lips, face, tongue, mouth, or throat.  Sneezing.  Coughing.  Breathing loudly (wheezing).  Stuffy nose.  Tingling in the mouth.  A rash.  Itching.  Itchy, red, puffy areas of skin (hives).  Watery eyes.  Throwing up (vomiting).  Watery poop (diarrhea).  Dizziness.  Feeling faint or fainting.  Trouble breathing or swallowing.  A tight feeling in the chest.  A fast heartbeat. HOW ARE ALLERGIES DIAGNOSED? Allergies can be diagnosed with:  A medical and family history.  Skin tests.  Blood tests.  A food diary. A food diary is a record of all the foods, drinks, and symptoms you have each day.  The results of an elimination diet. This diet involves making sure not to eat certain foods and then seeing what happens when you start eating them again. HOW ARE ALLERGIES TREATED? There is no cure for allergies, but allergic reactions can be treated with medicine. Severe reactions usually need to be treated at a hospital.  HOW CAN REACTIONS BE PREVENTED? The best way to prevent an allergic reaction is to avoid the thing you are allergic to. Allergy shots and medicines can also help prevent reactions in some cases.   This information is not intended to replace advice given to you by your health care provider. Make sure you discuss any questions you have with your health care provider.   Document Released: 06/23/2012 Document Revised: 03/19/2014 Document Reviewed: 12/08/2013 Elsevier Interactive Patient Education  2016 [employer] Fetal Movement Counts Patient Name: __________________________________________________ Patient Due Date: ____________________ Performing a fetal movement count is highly recommended in high-risk pregnancies, but it is good for every pregnant woman to do. Your health care provider may ask you to start counting fetal movements at 28 weeks of the pregnancy. Fetal movements often increase:  After eating a full meal.  After physical activity.  After eating or drinking something sweet or cold.  At rest. Pay attention to when you feel the baby is most active. This will help you notice a pattern of your baby's sleep and wake cycles and what factors contribute to an increase in fetal movement. It is important to perform a fetal movement count at the same time each day when your baby is normally most active.  HOW TO COUNT FETAL MOVEMENTS 1. Find a quiet and comfortable area to sit or lie down on your left side. Lying on your left side provides the best blood and oxygen circulation to your baby. 2. Write down the day and time on a sheet of paper or in a journal. 3. Start counting kicks, flutters, swishes, rolls, or jabs in a 2-hour period. You should feel at least 10 movements within 2 hours. 4. If you do not feel 10 movements in 2 hours, wait 2-3 hours and count again. Look for a change in the pattern or not enough counts in 2 hours. SEEK MEDICAL CARE IF:  You feel less than 10 counts in 2 hours, tried twice.  There is no movement in over an hour.  The  pattern is changing or taking longer each day to reach 10 counts in 2 hours.  You feel the baby is not moving as he or she usually does. Date: ____________ Movements: ____________ Start time: ____________ Doreatha Martin time: ____________  Date: ____________ Movements: ____________ Start time: ____________ Doreatha Martin time: ____________ Date: ____________ Movements: ____________ Start time: ____________ Doreatha Martin time: ____________ Date:  ____________ Movements: ____________ Start time: ____________ Doreatha Martin time: ____________ Date: ____________ Movements: ____________ Start time: ____________ Doreatha Martin time: ____________ Date: ____________ Movements: ____________ Start time: ____________ Doreatha Martin time: ____________ Date: ____________ Movements: ____________ Start time: ____________ Doreatha Martin time: ____________ Date: ____________ Movements: ____________ Start time: ____________ Doreatha Martin time: ____________  Date: ____________ Movements: ____________ Start time: ____________ Doreatha Martin time: ____________ Date: ____________ Movements: ____________ Start time: ____________ Doreatha Martin time: ____________ Date: ____________ Movements: ____________ Start time: ____________ Doreatha Martin time: ____________ Date: ____________ Movements: ____________ Start time: ____________ Doreatha Martin time: ____________ Date: ____________ Movements: ____________ Start time: ____________ Doreatha Martin time: ____________ Date: ____________ Movements: ____________ Start time: ____________ Doreatha Martin time: ____________ Date: ____________ Movements: ____________ Start time: ____________ Doreatha Martin time: ____________  Date: ____________ Movements: ____________ Start time: ____________ Doreatha Martin time: ____________ Date: ____________ Movements: ____________ Start time: ____________ Doreatha Martin time: ____________ Date: ____________ Movements: ____________ Start time: ____________ Doreatha Martin time: ____________ Date: ____________ Movements: ____________ Start time: ____________ Doreatha Martin time: ____________ Date: ____________ Movements: ____________ Start time: ____________ Doreatha Martin time: ____________ Date: ____________ Movements: ____________ Start time: ____________ Doreatha Martin time: ____________ Date: ____________ Movements: ____________ Start time: ____________ Doreatha Martin time: ____________  Date: ____________ Movements: ____________ Start time: ____________ Doreatha Martin time: ____________ Date: ____________ Movements: ____________ Start  time: ____________ Doreatha Martin time: ____________ Date: ____________ Movements: ____________ Start time: ____________ Doreatha Martin time: ____________ Date: ____________ Movements: ____________ Start time: ____________ Doreatha Martin time: ____________ Date: ____________ Movements: ____________ Start time: ____________ Doreatha Martin time: ____________ Date: ____________ Movements: ____________ Start time: ____________ Doreatha Martin time: ____________ Date: ____________ Movements: ____________ Start time: ____________ Doreatha Martin time: ____________  Date: ____________ Movements: ____________ Start time: ____________ Doreatha Martin time: ____________ Date: ____________ Movements: ____________ Start time: ____________ Doreatha Martin time: ____________ Date: ____________ Movements: ____________ Start time: ____________ Doreatha Martin time: ____________ Date: ____________ Movements: ____________ Start time: ____________ Doreatha Martin time: ____________ Date: ____________ Movements: ____________ Start time: ____________ Doreatha Martin time: ____________ Date: ____________ Movements: ____________ Start time: ____________ Doreatha Martin time: ____________ Date: ____________ Movements: ____________ Start time: ____________ Doreatha Martin time: ____________  Date: ____________ Movements: ____________ Start time: ____________ Doreatha Martin time: ____________ Date: ____________ Movements: ____________ Start time: ____________ Doreatha Martin time: ____________ Date: ____________ Movements: ____________ Start time: ____________ Doreatha Martin time: ____________ Date: ____________ Movements: ____________ Start time: ____________ Doreatha Martin time: ____________ Date: ____________ Movements: ____________ Start time: ____________ Doreatha Martin time: ____________ Date: ____________ Movements: ____________ Start time: ____________ Doreatha Martin time: ____________ Date: ____________ Movements: ____________ Start time: ____________ Doreatha Martin time: ____________  Date: ____________ Movements: ____________ Start time: ____________ Doreatha Martin time:  ____________ Date: ____________ Movements: ____________ Start time: ____________ Doreatha Martin time: ____________ Date: ____________ Movements: ____________ Start time: ____________ Doreatha Martin time: ____________ Date: ____________ Movements: ____________ Start time: ____________ Doreatha Martin time: ____________ Date: ____________ Movements: ____________ Start time: ____________ Doreatha Martin time: ____________ Date: ____________ Movements: ____________ Start time: ____________ Doreatha Martin time: ____________ Date: ____________ Movements: ____________ Start time: ____________ Doreatha Martin time: ____________  Date: ____________ Movements: ____________ Start time: ____________ Doreatha Martin time: ____________ Date: ____________ Movements: ____________ Start time: ____________ Doreatha Martin time: ____________ Date: ____________ Movements: ____________ Start time: ____________ Doreatha Martin time: ____________ Date: ____________ Movements: ____________ Start time: ____________ Doreatha Martin time: ____________ Date: ____________ Movements: ____________ Start time: ____________ Doreatha Martin time: ____________ Date: ____________ Movements: ____________ Start time: ____________ Doreatha Martin time: ____________  This information is not intended to replace advice given to you by your health care provider. Make sure you discuss any questions you have with your health care provider.   Document Released: 03/28/2006 Document Revised: 03/19/2014 Document Reviewed: 12/24/2011 Elsevier Interactive Patient Education 2016 [employer]. Safe Medications in Pregnancy   Acne: Benzoyl Peroxide Salicylic Acid  Backache/Headache: Tylenol: 2 regular strength every 4 hours OR              2 Extra strength every 6 hours  Colds/Coughs/Allergies: Benadryl (alcohol free) 25 mg every 6 hours as needed Breath right strips Claritin Cepacol throat lozenges Chloraseptic throat spray Cold-Eeze- up to three times per day Cough drops, alcohol free Flonase (by prescription  only) Guaifenesin Mucinex Robitussin DM (plain only, alcohol free) Saline nasal spray/drops Sudafed (pseudoephedrine) & Actifed ** use only after [redacted] weeks gestation and if you do not have high blood pressure Tylenol Vicks Vaporub Zinc lozenges Zyrtec   Constipation: Colace Ducolax suppositories Fleet enema Glycerin suppositories Metamucil Milk of magnesia Miralax Senokot Smooth move tea  Diarrhea: Kaopectate Imodium A-D  *NO pepto Bismol  Hemorrhoids: Anusol Anusol HC Preparation H Tucks  Indigestion: Tums Maalox Mylanta Zantac  Pepcid  Insomnia: Benadryl (alcohol free) 25mg  every 6 hours as needed Tylenol PM Unisom, no Gelcaps  Leg Cramps: Tums MagGel  Nausea/Vomiting:  Bonine Dramamine Emetrol Ginger extract Sea bands Meclizine  Nausea medication to take during pregnancy:  Unisom (doxylamine succinate 25 mg tablets) Take one tablet daily at bedtime. If symptoms are not adequately controlled, the dose can be increased to a maximum recommended dose of two tablets daily (1/2 tablet in the morning, 1/2 tablet mid-afternoon and one at bedtime). Vitamin B6 100mg  tablets. Take one tablet twice a day (up to 200 mg per day).  Skin Rashes: Aveeno products Benadryl cream or 25mg  every 6 hours as needed Calamine Lotion 1% cortisone cream  Yeast infection: Gyne-lotrimin 7 Monistat 7  Gum/tooth pain: Anbesol  **If taking multiple medications, please check labels to avoid duplicating the same active ingredients **take medication as directed on the label ** Do not exceed 4000 mg of tylenol in 24 hours **Do not take medications that contain aspirin or ibuprofen

## 2015-10-27 LAB — OB RESULTS CONSOLE GBS: STREP GROUP B AG: POSITIVE

## 2015-11-17 ENCOUNTER — Encounter (HOSPITAL_COMMUNITY): Payer: Self-pay | Admitting: Emergency Medicine

## 2015-11-17 ENCOUNTER — Observation Stay (HOSPITAL_COMMUNITY)
Admission: EM | Admit: 2015-11-17 | Discharge: 2015-11-18 | Disposition: A | Discharge status: Home / Self Care | Payer: Medicaid Other | Attending: Obstetrics & Gynecology | Admitting: Obstetrics & Gynecology

## 2015-11-17 DIAGNOSIS — O26893 Other specified pregnancy related conditions, third trimester: Secondary | ICD-10-CM | POA: Diagnosis present

## 2015-11-17 DIAGNOSIS — Z3A39 39 weeks gestation of pregnancy: Secondary | ICD-10-CM | POA: Insufficient documentation

## 2015-11-17 DIAGNOSIS — R109 Unspecified abdominal pain: Secondary | ICD-10-CM | POA: Diagnosis not present

## 2015-11-17 DIAGNOSIS — M542 Cervicalgia: Secondary | ICD-10-CM | POA: Insufficient documentation

## 2015-11-17 DIAGNOSIS — Z3490 Encounter for supervision of normal pregnancy, unspecified, unspecified trimester: Secondary | ICD-10-CM

## 2015-11-17 HISTORY — DX: Anemia, unspecified: D64.9

## 2015-11-17 MED ORDER — OXYCODONE-ACETAMINOPHEN 5-325 MG PO TABS
1.0000 | ORAL_TABLET | Freq: Once | ORAL | Status: AC
  Administered 2015-11-17: 1 via ORAL
  Filled 2015-11-17: qty 1

## 2015-11-17 NOTE — ED Notes (Signed)
Rapid Response OB RN at bedside 
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
## 2015-11-17 NOTE — ED Notes (Signed)
PA at bedside.
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
## 2015-11-18 MED ORDER — OXYCODONE-ACETAMINOPHEN 5-325 MG PO TABS: 1.0000 | ORAL_TABLET | ORAL | 0 refills | Status: DC | PRN
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
## 2015-11-21 ENCOUNTER — Inpatient Hospital Stay (HOSPITAL_COMMUNITY)
Admission: AD | Admit: 2015-11-21 | Discharge: 2015-11-21 | Disposition: A | Discharge status: Home / Self Care | Payer: Medicaid Other | Source: Ambulatory Visit | Attending: Obstetrics and Gynecology | Admitting: Obstetrics and Gynecology

## 2015-11-21 ENCOUNTER — Encounter (HOSPITAL_COMMUNITY): Payer: Self-pay | Admitting: *Deleted

## 2015-11-21 DIAGNOSIS — O471 False labor at or after 37 completed weeks of gestation: Secondary | ICD-10-CM | POA: Diagnosis not present

## 2015-11-21 DIAGNOSIS — Z3A40 40 weeks gestation of pregnancy: Secondary | ICD-10-CM | POA: Insufficient documentation
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
## 2015-11-21 NOTE — Discharge Instructions (Signed)
Keep your scheduled appt for prenatal care. Call the office or provider on call with further concerns or return to MAU as needed. °

## 2015-11-23 ENCOUNTER — Inpatient Hospital Stay (HOSPITAL_COMMUNITY)
Admission: AD | Admit: 2015-11-23 | Discharge: 2015-11-24 | DRG: 775 | Disposition: A | Discharge status: Home / Self Care | Payer: Medicaid Other | Source: Ambulatory Visit | Attending: Obstetrics and Gynecology | Admitting: Obstetrics and Gynecology

## 2015-11-23 ENCOUNTER — Encounter (HOSPITAL_COMMUNITY): Payer: Self-pay

## 2015-11-23 DIAGNOSIS — B951 Streptococcus, group B, as the cause of diseases classified elsewhere: Secondary | ICD-10-CM

## 2015-11-23 DIAGNOSIS — A749 Chlamydial infection, unspecified: Secondary | ICD-10-CM | POA: Diagnosis not present

## 2015-11-23 DIAGNOSIS — O99824 Streptococcus B carrier state complicating childbirth: Secondary | ICD-10-CM | POA: Diagnosis present

## 2015-11-23 DIAGNOSIS — Z8249 Family history of ischemic heart disease and other diseases of the circulatory system: Secondary | ICD-10-CM | POA: Diagnosis not present

## 2015-11-23 DIAGNOSIS — O98811 Other maternal infectious and parasitic diseases complicating pregnancy, first trimester: Secondary | ICD-10-CM

## 2015-11-23 DIAGNOSIS — E559 Vitamin D deficiency, unspecified: Secondary | ICD-10-CM | POA: Diagnosis present

## 2015-11-23 DIAGNOSIS — Z889 Allergy status to unspecified drugs, medicaments and biological substances status: Secondary | ICD-10-CM

## 2015-11-23 DIAGNOSIS — J45909 Unspecified asthma, uncomplicated: Secondary | ICD-10-CM

## 2015-11-23 DIAGNOSIS — Z3A40 40 weeks gestation of pregnancy: Secondary | ICD-10-CM | POA: Diagnosis not present

## 2015-11-23 DIAGNOSIS — O9952 Diseases of the respiratory system complicating childbirth: Secondary | ICD-10-CM | POA: Diagnosis present

## 2015-11-23 DIAGNOSIS — Z888 Allergy status to other drugs, medicaments and biological substances status: Secondary | ICD-10-CM

## 2015-11-23 DIAGNOSIS — Z3483 Encounter for supervision of other normal pregnancy, third trimester: Secondary | ICD-10-CM | POA: Diagnosis present

## 2015-11-23 HISTORY — DX: Streptococcus, group b, as the cause of diseases classified elsewhere: B95.1

## 2015-11-23 LAB — LACTATE DEHYDROGENASE: LDH: 194 U/L — ABNORMAL HIGH (ref 98–192)

## 2015-11-23 LAB — COMPREHENSIVE METABOLIC PANEL
ALBUMIN: 3.1 g/dL — AB (ref 3.5–5.0)
ALT: 23 U/L (ref 14–54)
AST: 26 U/L (ref 15–41)
Alkaline Phosphatase: 210 U/L — ABNORMAL HIGH (ref 38–126)
Anion gap: 9 (ref 5–15)
BUN: 9 mg/dL (ref 6–20)
CHLORIDE: 106 mmol/L (ref 101–111)
CO2: 21 mmol/L — ABNORMAL LOW (ref 22–32)
CREATININE: 0.64 mg/dL (ref 0.44–1.00)
Calcium: 9.3 mg/dL (ref 8.9–10.3)
GFR calc Af Amer: >60 mL/min (ref >60)
GFR calc non Af Amer: >60 mL/min (ref >60)
GLUCOSE: 76 mg/dL (ref 65–99)
POTASSIUM: 3.8 mmol/L (ref 3.5–5.1)
Sodium: 136 mmol/L (ref 135–145)
Total Bilirubin: 1.3 mg/dL — ABNORMAL HIGH (ref 0.3–1.2)
Total Protein: 7.8 g/dL (ref 6.5–8.1)

## 2015-11-23 LAB — PROTEIN / CREATININE RATIO, URINE
Creatinine, Urine: 80 mg/dL
PROTEIN CREATININE RATIO: 0.26 mg/mg{creat} — AB (ref 0.00–0.15)
TOTAL PROTEIN, URINE: 21 mg/dL

## 2015-11-23 LAB — CBC
HEMATOCRIT: 38.4 % (ref 36.0–46.0)
Hemoglobin: 12.7 g/dL (ref 12.0–15.0)
MCH: 24.1 pg — AB (ref 26.0–34.0)
MCHC: 33.1 g/dL (ref 30.0–36.0)
MCV: 72.7 fL — AB (ref 78.0–100.0)
PLATELETS: 195 10*3/uL (ref 150–400)
RBC: 5.28 MIL/uL — ABNORMAL HIGH (ref 3.87–5.11)
RDW: 18.9 % — AB (ref 11.5–15.5)
WBC: 6.3 10*3/uL (ref 4.0–10.5)

## 2015-11-23 LAB — TYPE AND SCREEN
ABO/RH(D): B POS
Antibody Screen: NEGATIVE

## 2015-11-23 LAB — HIV ANTIBODY (ROUTINE TESTING W REFLEX): HIV Screen 4th Generation wRfx: NONREACTIVE

## 2015-11-23 LAB — RPR: RPR Ser Ql: NONREACTIVE

## 2015-11-23 LAB — URIC ACID: Uric Acid, Serum: 3.7 mg/dL (ref 2.3–6.6)

## 2015-11-23 MED ORDER — WITCH HAZEL-GLYCERIN EX PADS: 1.0000 "application " | MEDICATED_PAD | CUTANEOUS | Status: DC | PRN

## 2015-11-23 MED ORDER — ONDANSETRON HCL 4 MG/2ML IJ SOLN: 4.0000 mg | Freq: Four times a day (QID) | INTRAMUSCULAR | Status: DC | PRN

## 2015-11-23 MED ORDER — DIPHENHYDRAMINE HCL 25 MG PO CAPS: 25.0000 mg | ORAL_CAPSULE | Freq: Four times a day (QID) | ORAL | Status: DC | PRN

## 2015-11-23 MED ORDER — PRENATAL MULTIVITAMIN CH
1.0000 | ORAL_TABLET | Freq: Every day | ORAL | Status: DC
  Administered 2015-11-24: 1 via ORAL
  Filled 2015-11-23: qty 1

## 2015-11-23 MED ORDER — OXYCODONE-ACETAMINOPHEN 5-325 MG PO TABS: 2.0000 | ORAL_TABLET | ORAL | Status: DC | PRN

## 2015-11-23 MED ORDER — LIDOCAINE HCL (PF) 1 % IJ SOLN
30.0000 mL | INTRAMUSCULAR | Status: DC | PRN
  Administered 2015-11-23: 30 mL via SUBCUTANEOUS
  Filled 2015-11-23: qty 30

## 2015-11-23 MED ORDER — COCONUT OIL OIL: 1.0000 "application " | TOPICAL_OIL | Status: DC | PRN

## 2015-11-23 MED ORDER — SENNOSIDES-DOCUSATE SODIUM 8.6-50 MG PO TABS
2.0000 | ORAL_TABLET | ORAL | Status: DC
  Administered 2015-11-23: 2 via ORAL
  Filled 2015-11-23: qty 2

## 2015-11-23 MED ORDER — PENICILLIN G POTASSIUM 5000000 UNITS IJ SOLR
5.0000 10*6.[IU] | Freq: Once | INTRAVENOUS | Status: AC
  Administered 2015-11-23: 5 10*6.[IU] via INTRAVENOUS
  Filled 2015-11-23: qty 5

## 2015-11-23 MED ORDER — DIBUCAINE 1 % RE OINT: 1.0000 "application " | TOPICAL_OINTMENT | RECTAL | Status: DC | PRN

## 2015-11-23 MED ORDER — DEXTROSE 5 % IV SOLN
2.5000 10*6.[IU] | INTRAVENOUS | Status: DC
  Administered 2015-11-23: 2.5 10*6.[IU] via INTRAVENOUS
  Filled 2015-11-23 (×8): qty 2.5

## 2015-11-23 MED ORDER — OXYTOCIN BOLUS FROM INFUSION
500.0000 mL | Freq: Once | INTRAVENOUS | Status: AC
  Administered 2015-11-23: 500 mL via INTRAVENOUS

## 2015-11-23 MED ORDER — SIMETHICONE 80 MG PO CHEW: 80.0000 mg | CHEWABLE_TABLET | ORAL | Status: DC | PRN

## 2015-11-23 MED ORDER — ZOLPIDEM TARTRATE 5 MG PO TABS: 5.0000 mg | ORAL_TABLET | Freq: Every evening | ORAL | Status: DC | PRN

## 2015-11-23 MED ORDER — LACTATED RINGERS IV SOLN
INTRAVENOUS | Status: DC
  Administered 2015-11-23: 06:00:00 via INTRAVENOUS

## 2015-11-23 MED ORDER — FENTANYL CITRATE (PF) 100 MCG/2ML IJ SOLN
100.0000 ug | INTRAMUSCULAR | Status: DC | PRN
  Administered 2015-11-23: 100 ug via INTRAVENOUS
  Filled 2015-11-23: qty 2

## 2015-11-23 MED ORDER — ACETAMINOPHEN 325 MG PO TABS: 650.0000 mg | ORAL_TABLET | ORAL | Status: DC | PRN

## 2015-11-23 MED ORDER — OXYTOCIN 40 UNITS IN LACTATED RINGERS INFUSION - SIMPLE MED
2.5000 [IU]/h | INTRAVENOUS | Status: DC
  Filled 2015-11-23: qty 1000

## 2015-11-23 MED ORDER — IBUPROFEN 600 MG PO TABS
600.0000 mg | ORAL_TABLET | Freq: Four times a day (QID) | ORAL | Status: DC
  Administered 2015-11-23 – 2015-11-24 (×5): 600 mg via ORAL
  Filled 2015-11-23 (×5): qty 1

## 2015-11-23 MED ORDER — OXYCODONE-ACETAMINOPHEN 5-325 MG PO TABS: 1.0000 | ORAL_TABLET | ORAL | Status: DC | PRN

## 2015-11-23 MED ORDER — ONDANSETRON HCL 4 MG/2ML IJ SOLN: 4.0000 mg | INTRAMUSCULAR | Status: DC | PRN

## 2015-11-23 MED ORDER — MEDROXYPROGESTERONE ACETATE 150 MG/ML IM SUSP: 150.0000 mg | INTRAMUSCULAR | Status: DC | PRN

## 2015-11-23 MED ORDER — TETANUS-DIPHTH-ACELL PERTUSSIS 5-2.5-18.5 LF-MCG/0.5 IM SUSP: 0.5000 mL | Freq: Once | INTRAMUSCULAR | Status: DC

## 2015-11-23 MED ORDER — BENZOCAINE-MENTHOL 20-0.5 % EX AERO
1.0000 "application " | INHALATION_SPRAY | CUTANEOUS | Status: DC | PRN
  Administered 2015-11-24: 1 via TOPICAL
  Filled 2015-11-23: qty 56

## 2015-11-23 MED ORDER — LACTATED RINGERS IV SOLN
500.0000 mL | INTRAVENOUS | Status: DC | PRN
  Administered 2015-11-23: 300 mL via INTRAVENOUS

## 2015-11-23 MED ORDER — ONDANSETRON HCL 4 MG PO TABS: 4.0000 mg | ORAL_TABLET | ORAL | Status: DC | PRN

## 2015-11-23 MED ORDER — FLEET ENEMA 7-19 GM/118ML RE ENEM: 1.0000 | ENEMA | RECTAL | Status: DC | PRN

## 2015-11-23 MED ORDER — ACETAMINOPHEN 325 MG PO TABS
650.0000 mg | ORAL_TABLET | ORAL | Status: DC | PRN
  Administered 2015-11-23: 650 mg via ORAL
  Filled 2015-11-23: qty 2

## 2015-11-23 MED ORDER — SOD CITRATE-CITRIC ACID 500-334 MG/5ML PO SOLN: 30.0000 mL | ORAL | Status: DC | PRN

## 2015-11-23 NOTE — Discharge Instructions (Signed)
Etonogestrel implant What is this medicine? ETONOGESTREL (et oh noe JES trel) is a contraceptive (birth control) device. It is used to prevent pregnancy. It can be used for up to 3 years. This medicine may be used for other purposes; ask your health care provider or pharmacist if you have questions. What should I tell my health care provider before I take this medicine? They need to know if you have any of these conditions: -abnormal vaginal bleeding -blood vessel disease or blood clots -cancer of the breast, cervix, or liver -depression -diabetes -gallbladder disease -headaches -heart disease or recent heart attack -high blood pressure -high cholesterol -kidney disease -liver disease -renal disease -seizures -tobacco smoker -an unusual or allergic reaction to etonogestrel, other hormones, anesthetics or antiseptics, medicines, foods, dyes, or preservatives -pregnant or trying to get pregnant -breast-feeding How should I use this medicine? This device is inserted just under the skin on the inner side of your upper arm by a health care professional. Talk to your pediatrician regarding the use of this medicine in children. Special care may be needed. Overdosage: If you think you have taken too much of this medicine contact a poison control center or emergency room at once. NOTE: This medicine is only for you. Do not share this medicine with others. What if I miss a dose? This does not apply. What may interact with this medicine? Do not take this medicine with any of the following medications: -amprenavir -bosentan -fosamprenavir This medicine may also interact with the following medications: -barbiturate medicines for inducing sleep or treating seizures -certain medicines for fungal infections like ketoconazole and itraconazole -griseofulvin -medicines to treat seizures like carbamazepine, felbamate, oxcarbazepine, phenytoin,  topiramate -modafinil -phenylbutazone -rifampin -some medicines to treat HIV infection like atazanavir, indinavir, lopinavir, nelfinavir, tipranavir, ritonavir -St. John's wort This list may not describe all possible interactions. Give your health care provider a list of all the medicines, herbs, non-prescription drugs, or dietary supplements you use. Also tell them if you smoke, drink alcohol, or use illegal drugs. Some items may interact with your medicine. What should I watch for while using this medicine? This product does not protect you against HIV infection (AIDS) or other sexually transmitted diseases. You should be able to feel the implant by pressing your fingertips over the skin where it was inserted. Contact your doctor if you cannot feel the implant, and use a non-hormonal birth control method (such as condoms) until your doctor confirms that the implant is in place. If you feel that the implant may have broken or become bent while in your arm, contact your healthcare provider. What side effects may I notice from receiving this medicine? Side effects that you should report to your doctor or health care professional as soon as possible: -allergic reactions like skin rash, itching or hives, swelling of the face, lips, or tongue -breast lumps -changes in emotions or moods -depressed mood -heavy or prolonged menstrual bleeding -pain, irritation, swelling, or bruising at the insertion site -scar at site of insertion -signs of infection at the insertion site such as fever, and skin redness, pain or discharge -signs of pregnancy -signs and symptoms of a blood clot such as breathing problems; changes in vision; chest pain; severe, sudden headache; pain, swelling, warmth in the leg; trouble speaking; sudden numbness or weakness of the face, arm or leg -signs and symptoms of liver injury like dark yellow or brown urine; general ill feeling or flu-like symptoms; light-colored stools; loss of  appetite; nausea; right upper belly  pain; unusually weak or tired; yellowing of the eyes or skin -unusual vaginal bleeding, discharge -signs and symptoms of a stroke like changes in vision; confusion; trouble speaking or understanding; severe headaches; sudden numbness or weakness of the face, arm or leg; trouble walking; dizziness; loss of balance or coordination Side effects that usually do not require medical attention (Report these to your doctor or health care professional if they continue or are bothersome.): -acne -back pain -breast pain -changes in weight -dizziness -general ill feeling or flu-like symptoms -headache -irregular menstrual bleeding -nausea -sore throat -vaginal irritation or inflammation This list may not describe all possible side effects. Call your doctor for medical advice about side effects. You may report side effects to FDA at 1-800-FDA-1088. Where should I keep my medicine? This drug is given in a hospital or clinic and will not be stored at home. NOTE: This sheet is a summary. It may not cover all possible information. If you have questions about this medicine, talk to your doctor, pharmacist, or health care provider.    2016, Elsevier/[employer] Standard. (2013-12-11 14:07:06) Breastfeeding and Mastitis Mastitis is inflammation of the breast tissue. It can occur in women who are breastfeeding. This can make breastfeeding painful. Mastitis will sometimes go away on its own. Your health care provider will help determine if treatment is needed. CAUSES Mastitis is often associated with a blocked milk (lactiferous) duct. This can happen when too much milk builds up in the breast. Causes of excess milk in the breast can include:  Poor latch-on. If your baby is not latched onto the breast properly, she or he may not empty your breast completely while breastfeeding.  Allowing too much time to pass between feedings.  Wearing a bra or other clothing that is too tight.  This puts extra pressure on the lactiferous ducts so milk does not flow through them as it should. Mastitis can also be caused by a bacterial infection. Bacteria may enter the breast tissue through cuts or openings in the skin. In women who are breastfeeding, this may occur because of cracked or irritated skin. Cracks in the skin are often caused when your baby does not latch on properly to the breast. SIGNS AND SYMPTOMS  Swelling, redness, tenderness, and pain in an area of the breast.  Swelling of the glands under the arm on the same side.  Fever may or may not accompany mastitis. If an infection is allowed to progress, a collection of pus (abscess) may develop. DIAGNOSIS  Your health care provider can usually diagnose mastitis based on your symptoms and a physical exam. Tests may be done to help confirm the diagnosis. These may include:  Removal of pus from the breast by applying pressure to the area. This pus can be examined in the lab to determine which bacteria are present. If an abscess has developed, the fluid in the abscess can be removed with a needle. This can also be used to confirm the diagnosis and determine the bacteria present. In most cases, pus will not be present.  Blood tests to determine if your body is fighting a bacterial infection.  Mammogram or ultrasound tests to rule out other problems or diseases. TREATMENT  Mastitis that occurs with breastfeeding will sometimes go away on its own. Your health care provider may choose to wait 24 hours after first seeing you to decide whether a prescription medicine is needed. If your symptoms are worse after 24 hours, your health care provider will likely prescribe an antibiotic medicine  to treat the mastitis. He or she will determine which bacteria are most likely causing the infection and will then select an appropriate antibiotic medicine. This is sometimes changed based on the results of tests performed to identify the bacteria, or  if there is no response to the antibiotic medicine selected. Antibiotic medicines are usually given by mouth. You may also be given medicine for pain. HOME CARE INSTRUCTIONS  Only take over-the-counter or prescription medicines for pain, fever, or discomfort as directed by your health care provider.  If your health care provider prescribed an antibiotic medicine, take the medicine as directed. Make sure you finish it even if you start to feel better.  Do not wear a tight or underwire bra. Wear a soft, supportive bra.  Increase your fluid intake, especially if you have a fever.  Continue to empty the breast. Your health care provider can tell you whether this milk is safe for your infant or needs to be thrown out. You may be told to stop nursing until your health care provider thinks it is safe for your baby. Use a breast pump if you are advised to stop nursing.  Keep your nipples clean and dry.  Empty the first breast completely before going to the other breast. If your baby is not emptying your breasts completely for some reason, use a breast pump to empty your breasts.  If you go back to work, pump your breasts while at work to stay in time with your nursing schedule.  Avoid allowing your breasts to become overly filled with milk (engorged). SEEK MEDICAL CARE IF:  You have pus-like discharge from the breast.  Your symptoms do not improve with the treatment prescribed by your health care provider within 2 days. SEEK IMMEDIATE MEDICAL CARE IF:  Your pain and swelling are getting worse.  You have pain that is not controlled with medicine.  You have a red line extending from the breast toward your armpit.  You have a fever or persistent symptoms for more than 2-3 days.  You have a fever and your symptoms suddenly get worse. MAKE SURE YOU:   Understand these instructions.  Will watch your condition.  Will get help right away if you are not doing well or get worse.   This  information is not intended to replace advice given to you by your health care provider. Make sure you discuss any questions you have with your health care provider.   Document Released: 06/23/2004 Document Revised: 03/03/2013 Document Reviewed: 10/02/2012 Elsevier Interactive Patient Education [employer]. Breastfeeding Deciding to breastfeed is one of the best choices you can make for you and your baby. A change in hormones during pregnancy causes your breast tissue to grow and increases the number and size of your milk ducts. These hormones also allow proteins, sugars, and fats from your blood supply to make breast milk in your milk-producing glands. Hormones prevent breast milk from being released before your baby is born as well as prompt milk flow after birth. Once breastfeeding has begun, thoughts of your baby, as well as his or her sucking or crying, can stimulate the release of milk from your milk-producing glands.  BENEFITS OF BREASTFEEDING For Your Baby  Your first milk (colostrum) helps your baby's digestive system function better.  There are antibodies in your milk that help your baby fight off infections.  Your baby has a lower incidence of asthma, allergies, and sudden infant death syndrome.  The nutrients in breast milk are  better for your baby than infant formulas and are designed uniquely for your baby's needs.  Breast milk improves your baby's brain development.  Your baby is less likely to develop other conditions, such as childhood obesity, asthma, or type 2 diabetes mellitus. For You  Breastfeeding helps to create a very special bond between you and your baby.  Breastfeeding is convenient. Breast milk is always available at the correct temperature and costs nothing.  Breastfeeding helps to burn calories and helps you lose the weight gained during pregnancy.  Breastfeeding makes your uterus contract to its prepregnancy size faster and slows bleeding (lochia)  after you give birth.   Breastfeeding helps to lower your risk of developing type 2 diabetes mellitus, osteoporosis, and breast or ovarian cancer later in life. SIGNS THAT YOUR BABY IS HUNGRY Early Signs of Hunger  Increased alertness or activity.  Stretching.  Movement of the head from side to side.  Movement of the head and opening of the mouth when the corner of the mouth or cheek is stroked (rooting).  Increased sucking sounds, smacking lips, cooing, sighing, or squeaking.  Hand-to-mouth movements.  Increased sucking of fingers or hands. Late Signs of Hunger  Fussing.  Intermittent crying. Extreme Signs of Hunger Signs of extreme hunger will require calming and consoling before your baby will be able to breastfeed successfully. Do not wait for the following signs of extreme hunger to occur before you initiate breastfeeding:  Restlessness.  A loud, strong cry.  Screaming. BREASTFEEDING BASICS Breastfeeding Initiation  Find a comfortable place to sit or lie down, with your neck and back well supported.  Place a pillow or rolled up blanket under your baby to bring him or her to the level of your breast (if you are seated). Nursing pillows are specially designed to help support your arms and your baby while you breastfeed.  Make sure that your baby's abdomen is facing your abdomen.  Gently massage your breast. With your fingertips, massage from your chest wall toward your nipple in a circular motion. This encourages milk flow. You may need to continue this action during the feeding if your milk flows slowly.  Support your breast with 4 fingers underneath and your thumb above your nipple. Make sure your fingers are well away from your nipple and your baby's mouth.  Stroke your baby's lips gently with your finger or nipple.  When your baby's mouth is open wide enough, quickly bring your baby to your breast, placing your entire nipple and as much of the colored area  around your nipple (areola) as possible into your baby's mouth.  More areola should be visible above your baby's upper lip than below the lower lip.  Your baby's tongue should be between his or her lower gum and your breast.  Ensure that your baby's mouth is correctly positioned around your nipple (latched). Your baby's lips should create a seal on your breast and be turned out (everted).  It is common for your baby to suck about 2-3 minutes in order to start the flow of breast milk. Latching Teaching your baby how to latch on to your breast properly is very important. An improper latch can cause nipple pain and decreased milk supply for you and poor weight gain in your baby. Also, if your baby is not latched onto your nipple properly, he or she may swallow some air during feeding. This can make your baby fussy. Burping your baby when you switch breasts during the feeding can help to  get rid of the air. However, teaching your baby to latch on properly is still the best way to prevent fussiness from swallowing air while breastfeeding. Signs that your baby has successfully latched on to your nipple:  Silent tugging or silent sucking, without causing you pain.  Swallowing heard between every 3-4 sucks.  Muscle movement above and in front of his or her ears while sucking. Signs that your baby has not successfully latched on to nipple:  Sucking sounds or smacking sounds from your baby while breastfeeding.  Nipple pain. If you think your baby has not latched on correctly, slip your finger into the corner of your baby's mouth to break the suction and place it between your baby's gums. Attempt breastfeeding initiation again. Signs of Successful Breastfeeding Signs from your baby:  A gradual decrease in the number of sucks or complete cessation of sucking.  Falling asleep.  Relaxation of his or her body.  Retention of a small amount of milk in his or her mouth.  Letting go of your breast by  himself or herself. Signs from you:  Breasts that have increased in firmness, weight, and size 1-3 hours after feeding.  Breasts that are softer immediately after breastfeeding.  Increased milk volume, as well as a change in milk consistency and color by the fifth day of breastfeeding.  Nipples that are not sore, cracked, or bleeding. Signs That Your [employer] is Getting Enough Milk  Wetting at least 3 diapers in a 24-hour period. The urine should be clear and pale yellow by age 2 days.  At least 3 stools in a 24-hour period by age 2 days. The stool should be soft and yellow.  At least 3 stools in a 24-hour period by age 38 days. The stool should be seedy and yellow.  No loss of weight greater than 10% of birth weight during the first 42 days of age.  Average weight gain of 4-7 ounces (113-198 g) per week after age 37 days.  Consistent daily weight gain by age 33 days, without weight loss after the age of 2 weeks. After a feeding, your baby may spit up a small amount. This is common. BREASTFEEDING FREQUENCY AND DURATION Frequent feeding will help you make more milk and can prevent sore nipples and breast engorgement. Breastfeed when you feel the need to reduce the fullness of your breasts or when your baby shows signs of hunger. This is called "breastfeeding on demand." Avoid introducing a pacifier to your baby while you are working to establish breastfeeding (the first 4-6 weeks after your baby is born). After this time you may choose to use a pacifier. Research has shown that pacifier use during the first year of a baby's life decreases the risk of sudden infant death syndrome (SIDS). Allow your baby to feed on each breast as long as he or she wants. Breastfeed until your baby is finished feeding. When your baby unlatches or falls asleep while feeding from the first breast, offer the second breast. Because newborns are often sleepy in the first few weeks of life, you may need to awaken your baby  to get him or her to feed. Breastfeeding times will vary from baby to baby. However, the following rules can serve as a guide to help you ensure that your baby is properly fed:  Newborns (babies 25 weeks of age or younger) may breastfeed every 1-3 hours.  Newborns should not go longer than 3 hours during the day or 5 hours during the  night without breastfeeding.  You should breastfeed your baby a minimum of 8 times in a 24-hour period until you begin to introduce solid foods to your baby at around 6 months of age. BREAST MILK PUMPING Pumping and storing breast milk allows you to ensure that your baby is exclusively fed your breast milk, even at times when you are unable to breastfeed. This is especially important if you are going back to work while you are still breastfeeding or when you are not able to be present during feedings. Your lactation consultant can give you guidelines on how long it is safe to store breast milk. A breast pump is a machine that allows you to pump milk from your breast into a sterile bottle. The pumped breast milk can then be stored in a refrigerator or freezer. Some breast pumps are operated by hand, while others use electricity. Ask your lactation consultant which type will work best for you. Breast pumps can be purchased, but some hospitals and breastfeeding support groups lease breast pumps on a monthly basis. A lactation consultant can teach you how to hand express breast milk, if you prefer not to use a pump. CARING FOR YOUR BREASTS WHILE YOU BREASTFEED Nipples can become dry, cracked, and sore while breastfeeding. The following recommendations can help keep your breasts moisturized and healthy:  Avoid using soap on your nipples.  Wear a supportive bra. Although not required, special nursing bras and tank tops are designed to allow access to your breasts for breastfeeding without taking off your entire bra or top. Avoid wearing underwire-style bras or extremely tight  bras.  Air dry your nipples for 3-90mnutes after each feeding.  Use only cotton bra pads to absorb leaked breast milk. Leaking of breast milk between feedings is normal.  Use lanolin on your nipples after breastfeeding. Lanolin helps to maintain your skin's normal moisture barrier. If you use pure lanolin, you do not need to wash it off before feeding your baby again. Pure lanolin is not toxic to your baby. You may also hand express a few drops of breast milk and gently massage that milk into your nipples and allow the milk to air dry. In the first few weeks after giving birth, some women experience extremely full breasts (engorgement). Engorgement can make your breasts feel heavy, warm, and tender to the touch. Engorgement peaks within 3-5 days after you give birth. The following recommendations can help ease engorgement:  Completely empty your breasts while breastfeeding or pumping. You may want to start by applying warm, moist heat (in the shower or with warm water-soaked hand towels) just before feeding or pumping. This increases circulation and helps the milk flow. If your baby does not completely empty your breasts while breastfeeding, pump any extra milk after he or she is finished.  Wear a snug bra (nursing or regular) or tank top for 1-2 days to signal your body to slightly decrease milk production.  Apply ice packs to your breasts, unless this is too uncomfortable for you.  Make sure that your baby is latched on and positioned properly while breastfeeding. If engorgement persists after 48 hours of following these recommendations, contact your health care provider or a lScience writer OVERALL HEALTH CARE RECOMMENDATIONS WHILE BREASTFEEDING  Eat healthy foods. Alternate between meals and snacks, eating 3 of each per day. Because what you eat affects your breast milk, some of the foods may make your baby more irritable than usual. Avoid eating these foods if you are sure that  they  are negatively affecting your baby.  Drink milk, fruit juice, and water to satisfy your thirst (about 10 glasses a day).  Rest often, relax, and continue to take your prenatal vitamins to prevent fatigue, stress, and anemia.  Continue breast self-awareness checks.  Avoid chewing and smoking tobacco. Chemicals from cigarettes that pass into breast milk and exposure to secondhand smoke may harm your baby.  Avoid alcohol and drug use, including marijuana. Some medicines that may be harmful to your baby can pass through breast milk. It is important to ask your health care provider before taking any medicine, including all over-the-counter and prescription medicine as well as vitamin and herbal supplements. It is possible to become pregnant while breastfeeding. If birth control is desired, ask your health care provider about options that will be safe for your baby. SEEK MEDICAL CARE IF:  You feel like you want to stop breastfeeding or have become frustrated with breastfeeding.  You have painful breasts or nipples.  Your nipples are cracked or bleeding.  Your breasts are red, tender, or warm.  You have a swollen area on either breast.  You have a fever or chills.  You have nausea or vomiting.  You have drainage other than breast milk from your nipples.  Your breasts do not become full before feedings by the fifth day after you give birth.  You feel sad and depressed.  Your baby is too sleepy to eat well.  Your baby is having trouble sleeping.   Your baby is wetting less than 3 diapers in a 24-hour period.  Your baby has less than 3 stools in a 24-hour period.  Your baby's skin or the white part of his or her eyes becomes yellow.   Your baby is not gaining weight by 96 days of age. SEEK IMMEDIATE MEDICAL CARE IF:  Your baby is overly tired (lethargic) and does not want to wake up and feed.  Your baby develops an unexplained fever.   This information is not intended to  replace advice given to you by your health care provider. Make sure you discuss any questions you have with your health care provider.   Document Released: 02/26/2005 Document Revised: 11/17/2014 Document Reviewed: 08/20/2012 Elsevier Interactive Patient Education 2016 [employer]. Iron-Rich Diet Iron is a mineral that helps your body to produce hemoglobin. Hemoglobin is a protein in your red blood cells that carries oxygen to your body's tissues. Eating too little iron may cause you to feel weak and tired, and it can increase your risk for infection. Eating enough iron is necessary for your body's metabolism, muscle function, and nervous system. Iron is naturally found in many foods. It can also be added to foods or fortified in foods. There are two types of dietary iron:  Heme iron. Heme iron is absorbed by the body more easily than nonheme iron. Heme iron is found in meat, poultry, and fish.  Nonheme iron. Nonheme iron is found in dietary supplements, iron-fortified grains, beans, and vegetables. You may need to follow an iron-rich diet if:  You have been diagnosed with iron deficiency or iron-deficiency anemia.  You have a condition that prevents you from absorbing dietary iron, such as:  Infection in your intestines.  Celiac disease. This involves long-lasting (chronic) inflammation of your intestines.  You do not eat enough iron.  You eat a diet that is high in foods that impair iron absorption.  You have lost a lot of blood.  You have heavy bleeding during  your menstrual cycle.  You are pregnant. WHAT IS MY PLAN? Your health care provider may help you to determine how much iron you need per day based on your condition. Generally, when a person consumes sufficient amounts of iron in the diet, the following iron needs are met:  Men.  46-59 years old: 11 mg per day.  26-71 years old: 8 mg per day.  Women.   49-65 years old: 15 mg per day.  98-73 years old: 18 mg per  day.  Over 93 years old: 8 mg per day.  Pregnant women: 27 mg per day.  Breastfeeding women: 9 mg per day. WHAT DO I NEED TO KNOW ABOUT AN IRON-RICH DIET?  Eat fresh fruits and vegetables that are high in vitamin C along with foods that are high in iron. This will help increase the amount of iron that your body absorbs from food, especially with foods containing nonheme iron. Foods that are high in vitamin C include oranges, peppers, tomatoes, and mango.  Take iron supplements only as directed by your health care provider. Overdose of iron can be life-threatening. If you were prescribed iron supplements, take them with orange juice or a vitamin C supplement.  Cook foods in pots and pans that are made from iron.   Eat nonheme iron-containing foods alongside foods that are high in heme iron. This helps to improve your iron absorption.   Certain foods and drinks contain compounds that impair iron absorption. Avoid eating these foods in the same meal as iron-rich foods or with iron supplements. These include:  Coffee, black tea, and red wine.  Milk, dairy products, and foods that are high in calcium.  Beans, soybeans, and peas.  Whole grains.  When eating foods that contain both nonheme iron and compounds that impair iron absorption, follow these tips to absorb iron better.   Soak beans overnight before cooking.  Soak whole grains overnight and drain them before using.  Ferment flours before baking, such as using yeast in bread dough. WHAT FOODS CAN I EAT? Grains Iron-fortified breakfast cereal. Iron-fortified whole-wheat bread. Enriched rice. Sprouted grains. Vegetables Spinach. Potatoes with skin. Green peas. Broccoli. Red and green bell peppers. Fermented vegetables. Fruits Prunes. Raisins. Oranges. Strawberries. Mango. Grapefruit. Meats and Other Protein Sources Beef liver. Oysters. Beef. Shrimp. [place]. Chicken. [place]. Sardines. Chickpeas. Nuts.  Tofu. Beverages Tomato juice. Fresh orange juice. Prune juice. Hibiscus tea. Fortified instant breakfast shakes. Condiments Tahini. Fermented soy sauce. Sweets and Desserts Black-strap molasses.  Other Wheat germ. The items listed above may not be a complete list of recommended foods or beverages. Contact your dietitian for more options. WHAT FOODS ARE NOT RECOMMENDED? Grains Whole grains. Bran cereal. Bran flour. Oats. Vegetables Artichokes. Brussels sprouts. Kale. Fruits Blueberries. Raspberries. Strawberries. Figs. Meats and Other Protein Sources Soybeans. Products made from soy protein. Dairy Milk. Cream. Cheese. Yogurt. Cottage cheese. Beverages Coffee. Black tea. Red wine. Sweets and Desserts Cocoa. Chocolate. Ice cream. Other Basil. Oregano. Parsley. The items listed above may not be a complete list of foods and beverages to avoid. Contact your dietitian for more information.   This information is not intended to replace advice given to you by your health care provider. Make sure you discuss any questions you have with your health care provider.   Document Released: 10/10/2004 Document Revised: 03/19/2014 Document Reviewed: 09/23/2013 Elsevier Interactive Patient Education 2016 [employer]. Postpartum Depression and Baby Blues The postpartum period begins right after the birth of a baby. During this time, there  is often a great amount of joy and excitement. It is also a time of many changes in the life of the parents. Regardless of how many times a mother gives birth, each child brings new challenges and dynamics to the family. It is not unusual to have feelings of excitement along with confusing shifts in moods, emotions, and thoughts. All mothers are at risk of developing postpartum depression or the "baby blues." These mood changes can occur right after giving birth, or they may occur many months after giving birth. The baby blues or postpartum depression  can be mild or severe. Additionally, postpartum depression can go away rather quickly, or it can be a long-term condition.  CAUSES Raised hormone levels and the rapid drop in those levels are thought to be a main cause of postpartum depression and the baby blues. A number of hormones change during and after pregnancy. Estrogen and progesterone usually decrease right after the delivery of your baby. The levels of thyroid hormone and various cortisol steroids also rapidly drop. Other factors that play a role in these mood changes include major life events and genetics.  RISK FACTORS If you have any of the following risks for the baby blues or postpartum depression, know what symptoms to watch out for during the postpartum period. Risk factors that may increase the likelihood of getting the baby blues or postpartum depression include:  Having a personal or family history of depression.   Having depression while being pregnant.   Having premenstrual mood issues or mood issues related to oral contraceptives.  Having a lot of life stress.   Having marital conflict.   Lacking a social support network.   Having a baby with special needs.   Having health problems, such as diabetes.  SIGNS AND SYMPTOMS Symptoms of baby blues include:  Brief changes in mood, such as going from extreme happiness to sadness.  Decreased concentration.   Difficulty sleeping.   Crying spells, tearfulness.   Irritability.   Anxiety.  Symptoms of postpartum depression typically begin within the first month after giving birth. These symptoms include:  Difficulty sleeping or excessive sleepiness.   Marked weight loss.   Agitation.   Feelings of worthlessness.   Lack of interest in activity or food.  Postpartum psychosis is a very serious condition and can be dangerous. Fortunately, it is rare. Displaying any of the following symptoms is cause for immediate medical attention. Symptoms of  postpartum psychosis include:   Hallucinations and delusions.   Bizarre or disorganized behavior.   Confusion or disorientation.  DIAGNOSIS  A diagnosis is made by an evaluation of your symptoms. There are no medical or lab tests that lead to a diagnosis, but there are various questionnaires that a health care provider may use to identify those with the baby blues, postpartum depression, or psychosis. Often, a screening tool called the [place] Postnatal Depression Scale is used to diagnose depression in the postpartum period.  TREATMENT The baby blues usually goes away on its own in 1-2 weeks. Social support is often all that is needed. You will be encouraged to get adequate sleep and rest. Occasionally, you may be given medicines to help you sleep.  Postpartum depression requires treatment because it can last several months or longer if it is not treated. Treatment may include individual or group therapy, medicine, or both to address any social, physiological, and psychological factors that may play a role in the depression. Regular exercise, a healthy diet, rest, and social support may  also be strongly recommended.  Postpartum psychosis is more serious and needs treatment right away. Hospitalization is often needed. HOME CARE INSTRUCTIONS  Get as much rest as you can. Nap when the baby sleeps.   Exercise regularly. Some women find yoga and walking to be beneficial.   Eat a balanced and nourishing diet.   Do little things that you enjoy. Have a cup of tea, take a bubble bath, read your favorite magazine, or listen to your favorite music.  Avoid alcohol.   Ask for help with household chores, cooking, grocery shopping, or running errands as needed. Do not try to do everything.   Talk to people close to you about how you are feeling. Get support from your partner, family members, friends, or other new moms.  Try to stay positive in how you think. Think about the things you are  grateful for.   Do not spend a lot of time alone.   Only take over-the-counter or prescription medicine as directed by your health care provider.  Keep all your postpartum appointments.   Let your health care provider know if you have any concerns.  SEEK MEDICAL CARE IF: You are having a reaction to or problems with your medicine. SEEK IMMEDIATE MEDICAL CARE IF:  You have suicidal feelings.   You think you may harm the baby or someone else. MAKE SURE YOU:  Understand these instructions.  Will watch your condition.  Will get help right away if you are not doing well or get worse.   This information is not intended to replace advice given to you by your health care provider. Make sure you discuss any questions you have with your health care provider.   Document Released: 12/01/2003 Document Revised: 03/03/2013 Document Reviewed: 12/08/2012 Elsevier Interactive Patient Education 2016 [employer]. Postpartum Care After Vaginal Delivery After you deliver your newborn (postpartum period), the usual stay in the hospital is 24-72 hours. If there were problems with your labor or delivery, or if you have other medical problems, you might be in the hospital longer.  While you are in the hospital, you will receive help and instructions on how to care for yourself and your newborn during the postpartum period.  While you are in the hospital:  Be sure to tell your nurses if you have pain or discomfort, as well as where you feel the pain and what makes the pain worse.  If you had an incision made near your vagina (episiotomy) or if you had some tearing during delivery, the nurses may put ice packs on your episiotomy or tear. The ice packs may help to reduce the pain and swelling.  If you are breastfeeding, you may feel uncomfortable contractions of your uterus for a couple of weeks. This is normal. The contractions help your uterus get back to normal size.  It is normal to have some  bleeding after delivery.  For the first 1-3 days after delivery, the flow is red and the amount may be similar to a period.  It is common for the flow to start and stop.  In the first few days, you may pass some small clots. Let your nurses know if you begin to pass large clots or your flow increases.  Do not  flush blood clots down the toilet before having the nurse look at them.  During the next 3-10 days after delivery, your flow should become more watery and pink or brown-tinged in color.  Ten to fourteen days after delivery, your  flow should be a small amount of yellowish-white discharge.  The amount of your flow will decrease over the first few weeks after delivery. Your flow may stop in 6-8 weeks. Most women have had their flow stop by 12 weeks after delivery.  You should change your sanitary pads frequently.  Wash your hands thoroughly with soap and water for at least 20 seconds after changing pads, using the toilet, or before holding or feeding your newborn.  You should feel like you need to empty your bladder within the first 6-8 hours after delivery.  In case you become weak, lightheaded, or faint, call your nurse before you get out of bed for the first time and before you take a shower for the first time.  Within the first few days after delivery, your breasts may begin to feel tender and full. This is called engorgement. Breast tenderness usually goes away within 48-72 hours after engorgement occurs. You may also notice milk leaking from your breasts. If you are not breastfeeding, do not stimulate your breasts. Breast stimulation can make your breasts produce more milk.  Spending as much time as possible with your newborn is very important. During this time, you and your newborn can feel close and get to know each other. Having your newborn stay in your room (rooming in) will help to strengthen the bond with your newborn. It will give you time to get to know your newborn and  become comfortable caring for your newborn.  Your hormones change after delivery. Sometimes the hormone changes can temporarily cause you to feel sad or tearful. These feelings should not last more than a few days. If these feelings last longer than that, you should talk to your caregiver.  If desired, talk to your caregiver about methods of family planning or contraception.  Talk to your caregiver about immunizations. Your caregiver may want you to have the following immunizations before leaving the hospital:  Tetanus, diphtheria, and pertussis (Tdap) or tetanus and diphtheria (Td) immunization. It is very important that you and your family (including grandparents) or others caring for your newborn are up-to-date with the Tdap or Td immunizations. The Tdap or Td immunization can help protect your newborn from getting ill.  Rubella immunization.  Varicella (chickenpox) immunization.  Influenza immunization. You should receive this annual immunization if you did not receive the immunization during your pregnancy.   This information is not intended to replace advice given to you by your health care provider. Make sure you discuss any questions you have with your health care provider.   Document Released: 12/24/2006 Document Revised: 11/21/2011 Document Reviewed: 10/24/2011 Elsevier Interactive Patient Education [employer].

## 2015-11-23 NOTE — Discharge Summary (Signed)
[facility]   Patient Name:   Melanie Cain DOB:     1995/11/29 MRN:     [id number]  Date of Admission:   11/23/2015 Date of Discharge:  11/24/2015  Admitting diagnosis:    40 WEEKS CTX Principal Problem:   Vaginal delivery Active Problems:   Positive GBS test   Chlamydia infection affecting pregnancy in first trimester--TOC negative   Vitamin D deficiency   Asthma   Drug allergy--prednisone  Term Pregnancy Delivered    Discharge diagnosis:    6340 WEEKS CTX Principal Problem:   Vaginal delivery Active Problems:   Positive GBS test   Chlamydia infection affecting pregnancy in first trimester--TOC negative   Vitamin D deficiency   Asthma   Drug allergy--prednisone Few elevated BPs during the intrapartum, recovery and postpartum periods. Pt asymptomatic throughout. preE labs done and normal - normal PCR. Will have [facility] Nurse see pt in 1 week for BP check.                                                                 Post partum procedures: NA  Type of Delivery:  SVB  Delivering Provider: Osborn CohoOBERTS, ANGELA   Date of Delivery:  11/23/15  Newborn Data:    Live born female  Birth Weight: 7 lb 9.3 oz (3439 g) APGARS: 9, 9  Baby's Name:              Jaclynn Guarnerisabella Baby Feeding:   Bottle and Breast Disposition:   home with mother  Complications:   None  Hospital course:      Onset of Labor With Vaginal Delivery     20 y.o. yo [id number] at [id number] was admitted in Latent Labor on 11/23/2015. Patient had an uncomplicated labor course as follows:  Membrane Rupture Time/Date: 12:48 PM ,11/23/2015   Intrapartum Procedures: Episiotomy: None [1]                                         Lacerations:  1st degree [2];Vaginal [6];Periurethral [8]  Patient had a delivery of a Viable infant. 11/23/2015  Information for the patient's newborn:  Excell SeltzerCooper, Girl Harlin Rainnnie Marie [[id number]][[id number]]  Delivery Method: Vaginal, Spontaneous Delivery (Filed from Delivery  Summary)    Patient had an uncomplicated postpartum course. She is ambulating, tolerating a regular diet, not passing flatus yet, and urinating well. Patient is discharged home in stable condition on 11/24/15.    Physical Exam:   Vitals:   11/23/15 1630 11/23/15 2040 11/23/15 2230 11/24/15 0515  BP: 130/70 (!) 139/91 125/72 126/72  Pulse: 64 (!) 59  64  Resp: 18 18  18   Temp: 98.4 F (36.9 C) 98.4 F (36.9 C)  98.3 F (36.8 C)  TempSrc: Oral Oral  Oral  Weight:      Height:       General: alert, cooperative and no distress Lochia: appropriate Uterine Fundus: firm Incision: Healing well with no significant drainage DVT Evaluation: No evidence of DVT seen on physical exam. Negative Homan's sign. No cords or calf tenderness.  Labs: Lab Results  Component Value Date   WBC 8.8 11/24/2015   HGB 11.0 (L)  11/24/2015   HCT 33.2 (L) 11/24/2015   MCV 72.6 (L) 11/24/2015   PLT 180 11/24/2015   CMP Latest Ref Rng & Units 11/23/2015  Glucose 65 - 99 mg/dL 76  BUN 6 - 20 mg/dL 9  Creatinine 1.61 - 0.96 mg/dL 0.45  Sodium 409 - 811 mmol/L 136  Potassium 3.5 - 5.1 mmol/L 3.8  Chloride 101 - 111 mmol/L 106  CO2 22 - 32 mmol/L 21(L)  Calcium 8.9 - 10.3 mg/dL 9.3  Total Protein 6.5 - 8.1 g/dL 7.8  Total Bilirubin 0.3 - 1.2 mg/dL 9.1(Y)  Alkaline Phos 38 - 126 U/L 210(H)  AST 15 - 41 U/L 26  ALT 14 - 54 U/L 23    Discharge instruction: per After Visit Summary and "Baby and Me Booklet".  After Visit Meds:    Medication List    STOP taking these medications   ferrous sulfate 325 (65 FE) MG EC tablet   oxyCODONE-acetaminophen 5-325 MG tablet Commonly known as:  PERCOCET/ROXICET     TAKE these medications   albuterol 108 (90 Base) MCG/ACT inhaler Commonly known as:  PROVENTIL HFA;VENTOLIN HFA Inhale 2 puffs into the lungs every 6 (six) hours as needed for wheezing or shortness of breath. What changed:  Another medication with the same name was removed. Continue taking  this medication, and follow the directions you see here.   cetirizine 10 MG tablet Commonly known as:  ZYRTEC Take 1 tablet (10 mg total) by mouth daily.   cholecalciferol 1000 units tablet Commonly known as:  VITAMIN D Take 1,000 Units by mouth daily.   diphenhydrAMINE 25 MG tablet Commonly known as:  BENADRYL Take 25 mg by mouth every 6 (six) hours as needed for allergies.   fluticasone 50 MCG/ACT nasal spray Commonly known as:  FLONASE Place 2 sprays into both nostrils daily.   ibuprofen 600 MG tablet Commonly known as:  ADVIL,MOTRIN Take 1 tablet (600 mg total) by mouth every 6 (six) hours as needed for fever, headache, mild pain, moderate pain or cramping.   levalbuterol 0.63 MG/3ML nebulizer solution Commonly known as:  XOPENEX Take 3 mLs (0.63 mg total) by nebulization 2 (two) times daily as needed for wheezing or shortness of breath.   prenatal multivitamin Tabs tablet Take 1 tablet by mouth daily at 12 noon.       Diet: routine diet  Activity: Advance as tolerated. Pelvic rest for 6 weeks.   Outpatient follow up:6 weeks.Few elevated BPs during the intrapartum, recovery and postpartum periods. Pt asymptomatic throughout. preE labs done and normal - normal PCR. Will have [facility] Nurse see pt in 1 week for BP check.          Postpartum contraception: Implanon  11/24/2015 Sherre Scarlet, CNM
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
## 2015-11-23 NOTE — Lactation Note (Signed)
This note was copied from a baby's chart. Lactation Consultation Note  Patient Name: Melanie Cain Today's Date: 11/23/2015 Reason for consult: Initial assessment   Initial consult with Exp BF mom of 1 hour old infant. Mom and GM were attempting to latch infant. Infant was swaddled in a blanket, unwrapped and placed STS with mom.   Mom with compressible breasts and everted nipples. Infant did not like to open wide and was noted to be tongue sucking, biting at the breast and on finger and tongue thrusting. Discussed suck training and enc mom to do prior to each feeding.  Infant did finally latch after 10 minutes of trying. Infant with flanged lips and rhythmic sucking. She was noted to have a few swallows. She was still feeding when I left the room.  Enc mom to feed 8-12 x in 24 hours at first feeding cues, enc mom to call for assistance as needed to help latch infant. Discussed latching using cross cradle hold with mom. Mom drowsy with feeding.   BF Resources Handout and LC Brochure given, mom informed of BF Support Groups, IP/OP Services and LC phone #. [facility] and does not have a pump at home. Follow later today or tomorrow.      Maternal Data Formula Feeding for Exclusion: Yes Reason for exclusion: Mother's choice to formula and breast feed on admission Does the patient have breastfeeding experience prior to this delivery?: Yes  Feeding Feeding Type: Breast Fed Length of feed: 15 min  LATCH Score/Interventions Latch: Repeated attempts needed to sustain latch, nipple held in mouth throughout feeding, stimulation needed to elicit sucking reflex. Intervention(s): Adjust position;Assist with latch;Breast massage;Breast compression  Audible Swallowing: A few with stimulation Intervention(s): Skin to skin;Hand expression;Alternate breast massage  Type of Nipple: Everted at rest and after stimulation  Comfort (Breast/Nipple): Soft / non-tender     Hold  (Positioning): Assistance needed to correctly position infant at breast and maintain latch. Intervention(s): Breastfeeding basics reviewed;Support Pillows;Position options;Skin to skin  LATCH Score: 7  Lactation Tools Discussed/Used WIC Program: Yes   Consult Status Consult Status: Follow-up Date: 11/23/15 Follow-up type: In-patient    Silas FloodSharon S Emogene Muratalla 11/23/2015, 2:29 PM

## 2015-11-23 NOTE — MAU Note (Signed)
Patient presents with ctxs 2-3 mins apart. Patient denies any bleeding or LOF. Fetus active.

## 2015-11-23 NOTE — Progress Notes (Signed)
Spoke with Sherre ScarletKimberly Williams CNM on unit regarding pt's BP of 139/91 @ 2040. Pt had room full of visitors (>5) at that time. Will recheck BP again when room calms down as visitors still present even after visiting hours finished. No new orders. Will continue to closely monitor.

## 2015-11-24 LAB — CBC
HEMATOCRIT: 33.2 % — AB (ref 36.0–46.0)
HEMOGLOBIN: 11 g/dL — AB (ref 12.0–15.0)
MCH: 24.1 pg — AB (ref 26.0–34.0)
MCHC: 33.1 g/dL (ref 30.0–36.0)
MCV: 72.6 fL — AB (ref 78.0–100.0)
PLATELETS: 180 10*3/uL (ref 150–400)
RBC: 4.57 MIL/uL (ref 3.87–5.11)
RDW: 18.8 % — ABNORMAL HIGH (ref 11.5–15.5)
WBC: 8.8 10*3/uL (ref 4.0–10.5)

## 2015-11-24 MED ORDER — IBUPROFEN 600 MG PO TABS: 600.0000 mg | ORAL_TABLET | Freq: Four times a day (QID) | ORAL | 2 refills | Status: DC | PRN

## 2015-11-24 NOTE — Lactation Note (Signed)
This note was copied from a baby's chart. Lactation Consultation Note  Patient Name: Girl Donny Piquennie Marie Cooper ONGEX'BToday's Date: 11/24/2015 Reason for consult: Follow-up assessment Baby at 31 hr of life. Mom reports baby latches well to the L breast but will not take the R breast. Mom prefers cradle position. Demonstrated laid back bf. Baby latched easily and comfortably. Mom is aware of lactation services and support group. She will call as needed.   Maternal Data    Feeding Feeding Type: Breast Fed Nipple Type: Slow - flow  LATCH Score/Interventions Latch: Grasps breast easily, tongue down, lips flanged, rhythmical sucking. Intervention(s): Adjust position;Assist with latch  Audible Swallowing: Spontaneous and intermittent  Type of Nipple: Everted at rest and after stimulation  Comfort (Breast/Nipple): Soft / non-tender     Hold (Positioning): Assistance needed to correctly position infant at breast and maintain latch. Intervention(s): Support Pillows;Position options  LATCH Score: 9  Lactation Tools Discussed/Used     Consult Status Consult Status: Follow-up Date: 11/25/15 Follow-up type: In-patient    Rulon Eisenmengerlizabeth E Allina Riches 11/24/2015, 8:23 PM

## 2015-11-24 NOTE — Lactation Note (Signed)
This note was copied from a baby's chart. Lactation Consultation Note  Baby latched upon entering.  Sucks and swallows observed. Mother stated she could not latch baby on R breast, nor with her 1st child. R nipple is less evert than L nipple.  Demonstrated how to prepump w/ manual pump to evert nipple. Assisted w/ latching on L breast.  Baby latched.  Sucks and swallows observed. Mother happy. Encouraged hand expression and prepumping on L before latching. Mom encouraged to feed baby 8-12 times/24 hours and with feeding cues.  Reviewed engorgement care and monitoring voids/stools.  Patient Name: Melanie Cain Today's Date: 11/24/2015     Maternal Data    Feeding Feeding Type: Breast Fed Nipple Type: Slow - flow Length of feed: 50 min  LATCH Score/Interventions Latch: Grasps breast easily, tongue down, lips flanged, rhythmical sucking. (Latched upon entering) Intervention(s): Breast massage  Audible Swallowing: Spontaneous and intermittent Intervention(s): Skin to skin  Type of Nipple: Everted at rest and after stimulation  Comfort (Breast/Nipple): Soft / non-tender     Hold (Positioning): Assistance needed to correctly position infant at breast and maintain latch.  LATCH Score: 9  Lactation Tools Discussed/Used     Consult Status      Dahlia ByesBerkelhammer, Ruth Gree[facility] CenterBoschen 11/24/2015, 11:50 AM

## 2015-11-25 ENCOUNTER — Ambulatory Visit: Payer: Self-pay

## 2015-11-25 NOTE — Lactation Note (Signed)
This note was copied from a baby's chart. Lactation Consultation Note  Baby cueing and mother resting.  FOB holding infant. Suggest baby may be hungry so assisted mother w/ breastfeeding in side lying position on R side. Sucks and swallows observed.  FOB will observe family during feeding.  Reminded mother to compress/breast to keep baby active. Mom encouraged to feed baby 8-12 times/24 hours and with feeding cues.  Reviewed engorgement care and monitoring voids/stools.   Patient Name: Melanie Cain YNWGN'FToday's Date: 11/25/2015 Reason for consult: Follow-up assessment   Maternal Data    Feeding Feeding Type: Breast Fed Length of feed: 10 min  LATCH Score/Interventions Latch: Grasps breast easily, tongue down, lips flanged, rhythmical sucking. Intervention(s): Breast massage;Adjust position;Assist with latch  Audible Swallowing: A few with stimulation Intervention(s): Skin to skin  Type of Nipple: Everted at rest and after stimulation  Comfort (Breast/Nipple): Soft / non-tender     Hold (Positioning): Assistance needed to correctly position infant at breast and maintain latch.  LATCH Score: 8  Lactation Tools Discussed/Used     Consult Status Consult Status: Complete    Hardie PulleyBerkelhammer, Ruth Boschen 11/25/2015, 10:51 AM

## 2017-01-24 ENCOUNTER — Inpatient Hospital Stay (HOSPITAL_COMMUNITY)
Admission: AD | Admit: 2017-01-24 | Discharge: 2017-01-25 | Disposition: A | Discharge status: Home / Self Care | Payer: Self-pay | Source: Ambulatory Visit | Attending: Obstetrics and Gynecology | Admitting: Obstetrics and Gynecology

## 2017-01-24 ENCOUNTER — Encounter (HOSPITAL_COMMUNITY): Payer: Self-pay | Admitting: *Deleted

## 2017-01-24 DIAGNOSIS — J45909 Unspecified asthma, uncomplicated: Secondary | ICD-10-CM | POA: Insufficient documentation

## 2017-01-24 DIAGNOSIS — Z825 Family history of asthma and other chronic lower respiratory diseases: Secondary | ICD-10-CM | POA: Insufficient documentation

## 2017-01-24 DIAGNOSIS — Z888 Allergy status to other drugs, medicaments and biological substances status: Secondary | ICD-10-CM | POA: Insufficient documentation

## 2017-01-24 DIAGNOSIS — Z79899 Other long term (current) drug therapy: Secondary | ICD-10-CM | POA: Insufficient documentation

## 2017-01-24 DIAGNOSIS — E559 Vitamin D deficiency, unspecified: Secondary | ICD-10-CM | POA: Insufficient documentation

## 2017-01-24 DIAGNOSIS — N898 Other specified noninflammatory disorders of vagina: Secondary | ICD-10-CM

## 2017-01-24 DIAGNOSIS — Z8249 Family history of ischemic heart disease and other diseases of the circulatory system: Secondary | ICD-10-CM | POA: Insufficient documentation
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
## 2017-01-25 LAB — GC/CHLAMYDIA PROBE AMP (~~LOC~~) NOT AT ARMC
CHLAMYDIA, DNA PROBE: NEGATIVE
Neisseria Gonorrhea: NEGATIVE

## 2017-01-25 LAB — URINALYSIS, ROUTINE W REFLEX MICROSCOPIC
Bilirubin Urine: NEGATIVE
Glucose, UA: NEGATIVE mg/dL
Hgb urine dipstick: NEGATIVE
Ketones, ur: NEGATIVE mg/dL
LEUKOCYTES UA: NEGATIVE
NITRITE: NEGATIVE
PH: 5 (ref 5.0–8.0)
Protein, ur: NEGATIVE mg/dL
SPECIFIC GRAVITY, URINE: 1.019 (ref 1.005–1.030)

## 2017-01-25 LAB — WET PREP, GENITAL
CLUE CELLS WET PREP: NONE SEEN
SPERM: NONE SEEN
TRICH WET PREP: NONE SEEN
Yeast Wet Prep HPF POC: NONE SEEN

## 2017-01-25 LAB — POCT PREGNANCY, URINE: Preg Test, Ur: NEGATIVE

## 2017-01-25 MED ORDER — NYSTATIN-TRIAMCINOLONE 100000-0.1 UNIT/GM-% EX OINT: 1.0000 "application " | TOPICAL_OINTMENT | Freq: Two times a day (BID) | CUTANEOUS | 0 refills | Status: DC
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
## 2017-01-25 NOTE — Discharge Instructions (Signed)

## 2017-03-30 ENCOUNTER — Encounter (HOSPITAL_COMMUNITY): Payer: Self-pay

## 2017-03-30 ENCOUNTER — Inpatient Hospital Stay (HOSPITAL_COMMUNITY)
Admission: AD | Admit: 2017-03-30 | Discharge: 2017-03-30 | Disposition: A | Discharge status: Home / Self Care | Source: Ambulatory Visit | Attending: Obstetrics and Gynecology | Admitting: Obstetrics and Gynecology

## 2017-03-30 DIAGNOSIS — R109 Unspecified abdominal pain: Secondary | ICD-10-CM | POA: Diagnosis not present

## 2017-03-30 DIAGNOSIS — N938 Other specified abnormal uterine and vaginal bleeding: Secondary | ICD-10-CM

## 2017-03-30 DIAGNOSIS — N760 Acute vaginitis: Secondary | ICD-10-CM | POA: Diagnosis not present

## 2017-03-30 DIAGNOSIS — B9689 Other specified bacterial agents as the cause of diseases classified elsewhere: Secondary | ICD-10-CM | POA: Insufficient documentation

## 2017-03-30 DIAGNOSIS — N939 Abnormal uterine and vaginal bleeding, unspecified: Secondary | ICD-10-CM | POA: Insufficient documentation

## 2017-03-30 LAB — URINALYSIS, ROUTINE W REFLEX MICROSCOPIC
BILIRUBIN URINE: NEGATIVE
GLUCOSE, UA: NEGATIVE mg/dL
KETONES UR: NEGATIVE mg/dL
Leukocytes, UA: NEGATIVE
Nitrite: NEGATIVE
PH: 6 (ref 5.0–8.0)
Protein, ur: 30 mg/dL — AB
Specific Gravity, Urine: 1.019 (ref 1.005–1.030)

## 2017-03-30 LAB — CBC WITH DIFFERENTIAL/PLATELET
BASOS PCT: 0 %
Basophils Absolute: 0 10*3/uL (ref 0.0–0.1)
EOS ABS: 0 10*3/uL (ref 0.0–0.7)
Eosinophils Relative: 1 %
HCT: 41.5 % (ref 36.0–46.0)
Hemoglobin: 13.3 g/dL (ref 12.0–15.0)
Lymphocytes Relative: 50 %
Lymphs Abs: 2.5 10*3/uL (ref 0.7–4.0)
MCH: 25.2 pg — ABNORMAL LOW (ref 26.0–34.0)
MCHC: 32 g/dL (ref 30.0–36.0)
MCV: 78.6 fL (ref 78.0–100.0)
MONO ABS: 0.1 10*3/uL (ref 0.1–1.0)
MONOS PCT: 3 %
Neutro Abs: 2.3 10*3/uL (ref 1.7–7.7)
Neutrophils Relative %: 46 %
Platelets: 279 10*3/uL (ref 150–400)
RBC: 5.28 MIL/uL — ABNORMAL HIGH (ref 3.87–5.11)
RDW: 15.3 % (ref 11.5–15.5)
WBC: 5 10*3/uL (ref 4.0–10.5)

## 2017-03-30 LAB — WET PREP, GENITAL
SPERM: NONE SEEN
Trich, Wet Prep: NONE SEEN
YEAST WET PREP: NONE SEEN

## 2017-03-30 LAB — POCT PREGNANCY, URINE: PREG TEST UR: NEGATIVE

## 2017-03-30 MED ORDER — METRONIDAZOLE 500 MG PO TABS: 500.0000 mg | ORAL_TABLET | Freq: Two times a day (BID) | ORAL | 0 refills | Status: DC

## 2017-03-30 MED ORDER — MEGESTROL ACETATE 40 MG PO TABS: ORAL_TABLET | ORAL | 1 refills | Status: DC

## 2017-03-30 MED ORDER — IBUPROFEN 600 MG PO TABS: 600.0000 mg | ORAL_TABLET | Freq: Four times a day (QID) | ORAL | 1 refills | Status: DC | PRN

## 2017-03-30 NOTE — MAU Note (Addendum)
Has had a period since end of November into first week of December.  Has been feeling lightheaded, headaches, back pain.   Has been having to wear a pantyliner every day, has Nexplanon for 1 year.

## 2017-03-30 NOTE — Discharge Instructions (Signed)
Get your medications from the pharmacy and take as directed. Make an appointment with clinic to follow up as needed if the bleeding

## 2017-03-30 NOTE — MAU Provider Note (Signed)
History     CSN: [id number]  Arrival date and time: 03/30/17 1711   First Provider Initiated Contact with Patient 03/30/17 1749      Chief Complaint  Patient presents with  . Amenorrhea   HPI Melanie Cain 22 y.o. Comes to MAU as she has had daily vaginal bleeding since December and now has vaginal odor.  Has a Nexplanon she has had for one year.  Has bled frequently with the Nexplanon.  This week has had cramping and feeling tired.   Does not have an appointment with a provider.  Her past provider no longer takes her insurance.   OB History    Gravida Para Term Preterm AB Living   2 2 2  0 0 2   SAB TAB Ectopic Multiple Live Births   0 0 0 0 2      Past Medical History:  Diagnosis Date  . Allergy   . Anemia   . Asthma   . Menometrorrhagia   . MRSA (methicillin resistant staph aureus) culture positive 2012   culture from abscess    Past Surgical History:  Procedure Laterality Date  . NO PAST SURGERIES      Family History  Problem Relation Age of Onset  . Asthma Other   . Cancer Other   . Asthma Mother   . Hypertension Mother   . Asthma Father   . Asthma Sister        2 sisters  . Cancer Maternal Grandmother        breast cancer  . Hearing loss Maternal Grandmother     Social History   Tobacco Use  . Smoking status: Never Smoker  . Smokeless tobacco: Never Used  Substance Use Topics  . Alcohol use: No  . Drug use: No    Allergies:  Allergies  Allergen Reactions  . Montelukast Sodium Other (See Comments)    Reaction:  Hallucinations   . Prednisone Swelling and Other (See Comments)    Reaction:  Tongue swelling  . Tessalon [Benzonatate] Hives    Medications Prior to Admission  Medication Sig Dispense Refill Last Dose  . albuterol (PROVENTIL HFA;VENTOLIN HFA) 108 (90 BASE) MCG/ACT inhaler Inhale 2 puffs into the lungs every 6 (six) hours as needed for wheezing or shortness of breath.    Past Month at Unknown time  . cetirizine (ZYRTEC)  10 MG tablet Take 1 tablet (10 mg total) by mouth daily. 30 tablet 0 Past Month at Unknown time  . cholecalciferol (VITAMIN D) 1000 units tablet Take 1,000 Units by mouth daily.   11/22/2015 at Unknown time  . diphenhydrAMINE (BENADRYL) 25 MG tablet Take 25 mg by mouth every 6 (six) hours as needed for allergies.   Past Month at Unknown time  . fluticasone (FLONASE) 50 MCG/ACT nasal spray Place 2 sprays into both nostrils daily. 16 g 0 Past Week at Unknown time  . ibuprofen (ADVIL,MOTRIN) 600 MG tablet Take 1 tablet (600 mg total) by mouth every 6 (six) hours as needed for fever, headache, mild pain, moderate pain or cramping. 30 tablet 2   . levalbuterol (XOPENEX) 0.63 MG/3ML nebulizer solution Take 3 mLs (0.63 mg total) by nebulization 2 (two) times daily as needed for wheezing or shortness of breath. 3 mL 5 Past Month at Unknown time  . nystatin-triamcinolone ointment (MYCOLOG) Apply 1 application 2 (two) times daily topically. 30 g 0   . Prenatal Vit-Fe Fumarate-FA (PRENATAL MULTIVITAMIN) TABS tablet Take 1 tablet by mouth daily at  12 noon.   Past Week at Unknown time    Review of Systems  Constitutional: Negative for fever.  Gastrointestinal: Positive for abdominal pain. Negative for diarrhea, nausea and vomiting.  Genitourinary: Positive for vaginal bleeding. Negative for dysuria.       Vaginal odor  Neurological: Positive for weakness.   Physical Exam   Blood pressure 132/73, temperature 98.6 F (37 C), temperature source Oral, resp. rate 16, height 5\' 5"  (1.651 m), weight 151 lb (68.5 kg), last menstrual period 02/09/2017, unknown if currently breastfeeding.  Physical Exam  Nursing note and vitals reviewed. Constitutional: She is oriented to person, place, and time. She appears well-developed and well-nourished.  HENT:  Head: Normocephalic.  Eyes: EOM are normal.  Neck: Neck supple.  GI: Soft. There is no tenderness.  Genitourinary:  Genitourinary Comments: Speculum  exam: Vagina - Moderate amount of bloody discharge, no odor Cervix - small amount of active bleeding Bimanual exam: Cervix closed Uterus non tender, normal size Adnexa non tender, no masses bilaterally GC/Chlam, wet prep done Chaperone present for exam.   Musculoskeletal: Normal range of motion.  Nexplanon palpated in upper inner left arm.  Neurological: She is alert and oriented to person, place, and time.  Skin: Skin is warm and dry.  Psychiatric: She has a normal mood and affect.    MAU Course  Procedures Results for orders placed or performed during the hospital encounter of 03/30/17 (from the past 24 hour(s))  Urinalysis, Routine w reflex microscopic     Status: Abnormal   Collection Time: 03/30/17  5:30 PM  Result Value Ref Range   Color, Urine YELLOW YELLOW   APPearance CLEAR CLEAR   Specific Gravity, Urine 1.019 1.005 - 1.030   pH 6.0 5.0 - 8.0   Glucose, UA NEGATIVE NEGATIVE mg/dL   Hgb urine dipstick SMALL (A) NEGATIVE   Bilirubin Urine NEGATIVE NEGATIVE   Ketones, ur NEGATIVE NEGATIVE mg/dL   Protein, ur 30 (A) NEGATIVE mg/dL   Nitrite NEGATIVE NEGATIVE   Leukocytes, UA NEGATIVE NEGATIVE   RBC / HPF 6-30 0 - 5 RBC/hpf   WBC, UA 0-5 0 - 5 WBC/hpf   Bacteria, UA RARE (A) NONE SEEN   Squamous Epithelial / LPF 0-5 (A) NONE SEEN   Mucus PRESENT   Pregnancy, urine POC     Status: None   Collection Time: 03/30/17  5:37 PM  Result Value Ref Range   Preg Test, Ur NEGATIVE NEGATIVE  CBC with Differential     Status: Abnormal   Collection Time: 03/30/17  5:40 PM  Result Value Ref Range   WBC 5.0 4.0 - 10.5 K/uL   RBC 5.28 (H) 3.87 - 5.11 MIL/uL   Hemoglobin 13.3 12.0 - 15.0 g/dL   HCT 16.141.5 09.636.0 - 04.546.0 %   MCV 78.6 78.0 - 100.0 fL   MCH 25.2 (L) 26.0 - 34.0 pg   MCHC 32.0 30.0 - 36.0 g/dL   RDW 40.915.3 81.111.5 - 91.415.5 %   Platelets 279 150 - 400 K/uL   Neutrophils Relative % 46 %   Neutro Abs 2.3 1.7 - 7.7 K/uL   Lymphocytes Relative 50 %   Lymphs Abs 2.5 0.7 - 4.0  K/uL   Monocytes Relative 3 %   Monocytes Absolute 0.1 0.1 - 1.0 K/uL   Eosinophils Relative 1 %   Eosinophils Absolute 0.0 0.0 - 0.7 K/uL   Basophils Relative 0 %   Basophils Absolute 0.0 0.0 - 0.1 K/uL  Wet prep, genital  Status: Abnormal   Collection Time: 03/30/17  6:00 PM  Result Value Ref Range   Yeast Wet Prep HPF POC NONE SEEN NONE SEEN   Trich, Wet Prep NONE SEEN NONE SEEN   Clue Cells Wet Prep HPF POC PRESENT (A) NONE SEEN   WBC, Wet Prep HPF POC MODERATE (A) NONE SEEN   Sperm NONE SEEN     MDM Her vaginal bleeding is likely coming as the uterine lining has thinned due to the Nexplanon.  Will give Megace and explained to patient how to use the Megace to control the unexpected vaginal bleeding.  Do not expect her to have bleeding for more than 3-4 days at a time.  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  Advised 8 hours of sleep, 64 ounxes of water daily and exercise (not at work) at least 3 times a week.  This fatigue is not due to blood loss as you do not have anemia.  Assessment and Plan  Abnormal vaginal bleeding an abdominal pain likely related to Nexplanon side effects Bacterial vaginosis No anemia  Plan Drink at least 8 8-oz glasses of water every day. Use the ibuprofen 600 mg that you were prescribed for any abdominal pain. Use the metronidazole to treat the BV. Make an appointment with a [facility] clinic (phone numbers given) in case this plan is not working for you. Other labs are pending; Advised signing up for MyChart and can communicate with her provider that way too.   Terri L Burleson 03/30/2017, 6:06 PM

## 2017-04-01 LAB — GC/CHLAMYDIA PROBE AMP (~~LOC~~) NOT AT ARMC
Chlamydia: NEGATIVE
NEISSERIA GONORRHEA: NEGATIVE

## 2017-07-06 ENCOUNTER — Encounter (HOSPITAL_BASED_OUTPATIENT_CLINIC_OR_DEPARTMENT_OTHER): Payer: Self-pay | Admitting: Emergency Medicine

## 2017-07-06 ENCOUNTER — Other Ambulatory Visit: Payer: Self-pay

## 2017-07-06 ENCOUNTER — Emergency Department (HOSPITAL_BASED_OUTPATIENT_CLINIC_OR_DEPARTMENT_OTHER)

## 2017-07-06 ENCOUNTER — Emergency Department (HOSPITAL_BASED_OUTPATIENT_CLINIC_OR_DEPARTMENT_OTHER)
Admission: EM | Admit: 2017-07-06 | Discharge: 2017-07-06 | Disposition: A | Discharge status: Home / Self Care | Attending: Emergency Medicine | Admitting: Emergency Medicine

## 2017-07-06 DIAGNOSIS — Y939 Activity, unspecified: Secondary | ICD-10-CM | POA: Diagnosis not present

## 2017-07-06 DIAGNOSIS — R079 Chest pain, unspecified: Secondary | ICD-10-CM | POA: Diagnosis not present

## 2017-07-06 DIAGNOSIS — Y92410 Unspecified street and highway as the place of occurrence of the external cause: Secondary | ICD-10-CM | POA: Diagnosis not present

## 2017-07-06 DIAGNOSIS — J45909 Unspecified asthma, uncomplicated: Secondary | ICD-10-CM | POA: Diagnosis not present

## 2017-07-06 DIAGNOSIS — S46912A Strain of unspecified muscle, fascia and tendon at shoulder and upper arm level, left arm, initial encounter: Secondary | ICD-10-CM | POA: Insufficient documentation

## 2017-07-06 DIAGNOSIS — Y999 Unspecified external cause status: Secondary | ICD-10-CM | POA: Diagnosis not present

## 2017-07-06 DIAGNOSIS — S4992XA Unspecified injury of left shoulder and upper arm, initial encounter: Secondary | ICD-10-CM | POA: Diagnosis present

## 2017-07-06 MED ORDER — CYCLOBENZAPRINE HCL 10 MG PO TABS: 10.0000 mg | ORAL_TABLET | Freq: Two times a day (BID) | ORAL | 0 refills | Status: DC | PRN

## 2017-07-06 MED ORDER — NAPROXEN 500 MG PO TABS: 500.0000 mg | ORAL_TABLET | Freq: Two times a day (BID) | ORAL | 0 refills | Status: DC

## 2017-07-06 MED ORDER — KETOROLAC TROMETHAMINE 30 MG/ML IJ SOLN
30.0000 mg | Freq: Once | INTRAMUSCULAR | Status: AC
  Administered 2017-07-06: 30 mg via INTRAMUSCULAR
  Filled 2017-07-06: qty 1

## 2017-07-06 MED ORDER — OXYCODONE-ACETAMINOPHEN 5-325 MG PO TABS
1.0000 | ORAL_TABLET | Freq: Once | ORAL | Status: AC
  Administered 2017-07-06: 1 via ORAL
  Filled 2017-07-06: qty 1

## 2017-07-06 NOTE — Discharge Instructions (Signed)
Please read attached information regarding shoulder range of motion exercises.  Please complete these regularly to prevent stiffness of your shoulder joint, which can be more painful.
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
## 2017-07-06 NOTE — ED Notes (Signed)
PT refusing an injection at this time.

## 2017-07-06 NOTE — ED Notes (Signed)
Xray waiting for pt to have pain meds, will try exam after meds

## 2017-08-09 ENCOUNTER — Emergency Department (HOSPITAL_BASED_OUTPATIENT_CLINIC_OR_DEPARTMENT_OTHER)
Admission: EM | Admit: 2017-08-09 | Discharge: 2017-08-09 | Disposition: A | Discharge status: Home / Self Care | Attending: Emergency Medicine | Admitting: Emergency Medicine

## 2017-08-09 ENCOUNTER — Encounter (HOSPITAL_BASED_OUTPATIENT_CLINIC_OR_DEPARTMENT_OTHER): Payer: Self-pay | Admitting: *Deleted

## 2017-08-09 ENCOUNTER — Other Ambulatory Visit: Payer: Self-pay

## 2017-08-09 DIAGNOSIS — W57XXXA Bitten or stung by nonvenomous insect and other nonvenomous arthropods, initial encounter: Secondary | ICD-10-CM | POA: Diagnosis not present

## 2017-08-09 DIAGNOSIS — J45909 Unspecified asthma, uncomplicated: Secondary | ICD-10-CM | POA: Diagnosis not present

## 2017-08-09 DIAGNOSIS — Z79899 Other long term (current) drug therapy: Secondary | ICD-10-CM | POA: Diagnosis not present

## 2017-08-09 DIAGNOSIS — H6011 Cellulitis of right external ear: Secondary | ICD-10-CM | POA: Diagnosis not present

## 2017-08-09 DIAGNOSIS — H6191 Disorder of right external ear, unspecified: Secondary | ICD-10-CM | POA: Diagnosis present

## 2017-08-09 DIAGNOSIS — L03818 Cellulitis of other sites: Secondary | ICD-10-CM

## 2017-08-09 MED ORDER — AMOXICILLIN-POT CLAVULANATE 875-125 MG PO TABS
1.0000 | ORAL_TABLET | Freq: Once | ORAL | Status: AC
  Administered 2017-08-09: 1 via ORAL
  Filled 2017-08-09: qty 1

## 2017-08-09 MED ORDER — AMOXICILLIN-POT CLAVULANATE 875-125 MG PO TABS: 1.0000 | ORAL_TABLET | Freq: Two times a day (BID) | ORAL | 0 refills | Status: DC
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
## 2017-08-09 NOTE — ED Notes (Signed)
Pt came to door asking for some alcohol for her ear as it is burning.  Informed pt that alcohol may make it worse, and offered her an ice pack instead.

## 2017-08-09 NOTE — ED Triage Notes (Signed)
Pt reports that she had a tick on her right ear one week ago. States that she removed the tick but has since had a red, swollen, painful ear.

## 2017-09-07 IMAGING — US US OB TRANSVAGINAL
1 series · 15 of 28 positions shown · non-contrast
Comparison: Pelvic ultrasound performed 09/07/2014

CLINICAL DATA: Acute onset of generalized abdominal pain. Initial
encounter.

EXAM:
OBSTETRIC <14 WK US AND TRANSVAGINAL OB US
TECHNIQUE: Both transabdominal and transvaginal ultrasound examinations were
performed for complete evaluation of the gestation as well as the
maternal uterus, adnexal regions, and pelvic cul-de-sac.
Transvaginal technique was performed to assess early pregnancy.

[Series 1: us ob transvaginal · 75 acquisitions, 15 frames shown]
[im 1/75]
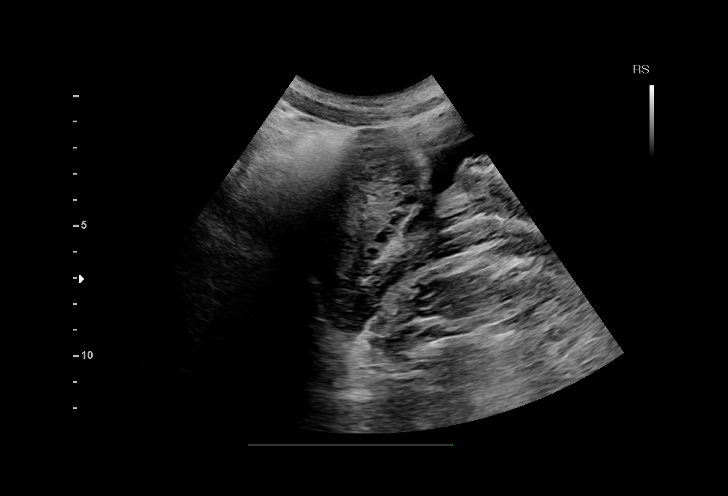
[im 6/75]
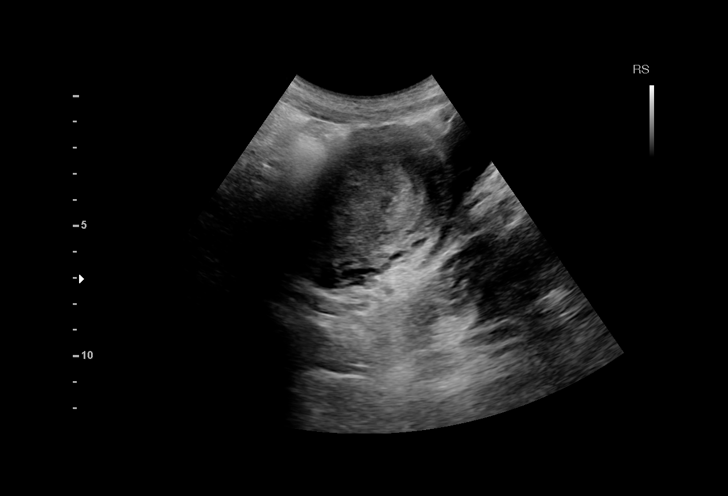
[im 11/75]
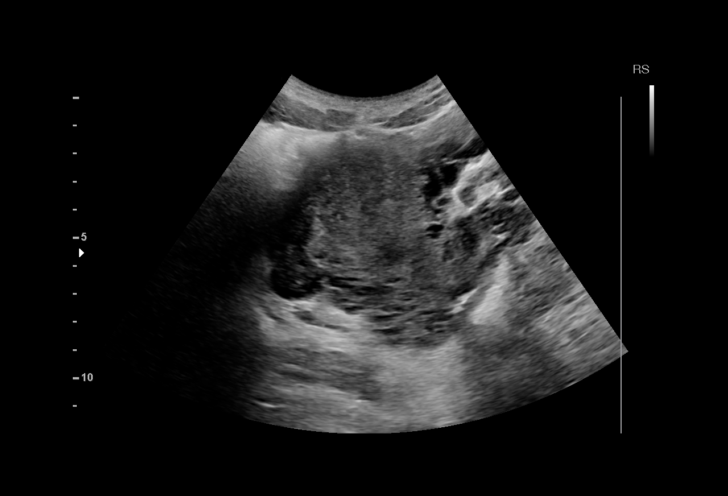
[im 17/75]
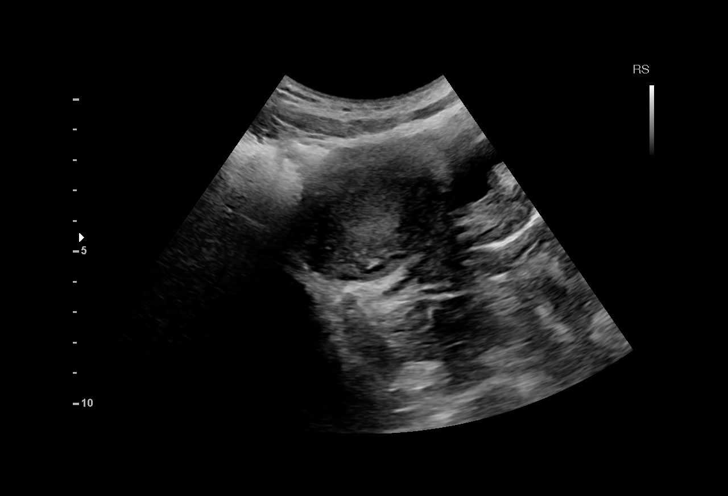
[im 22/75]
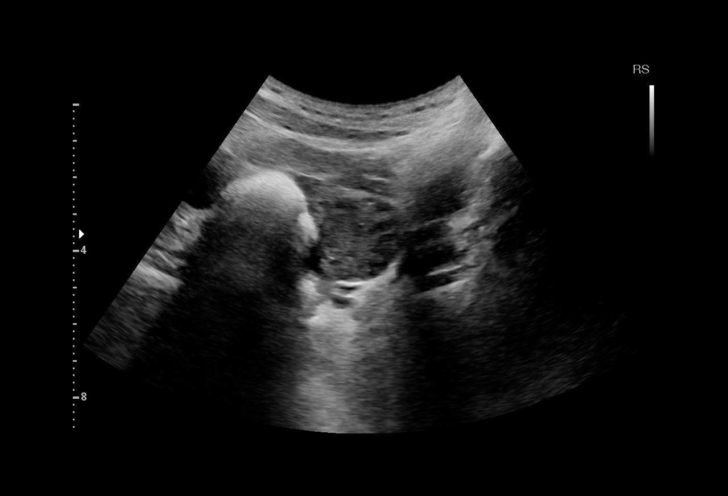
[im 28/75]
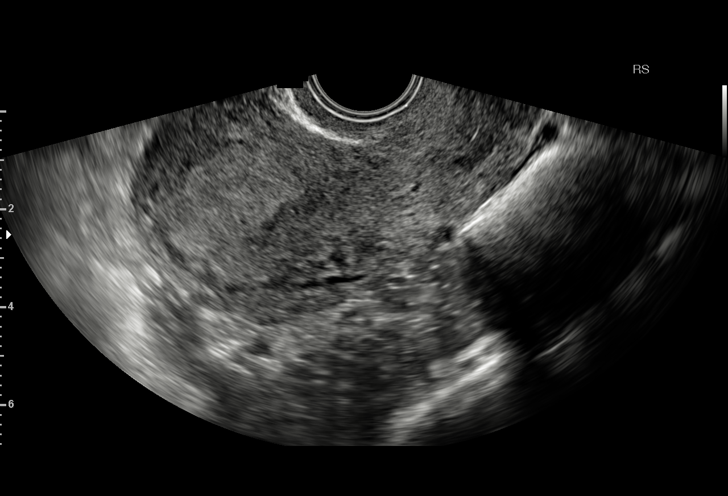
[im 33/75]
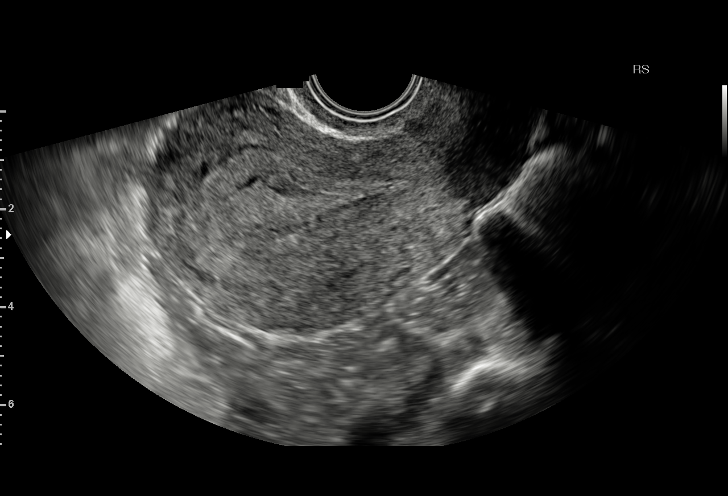
[im 39/75]
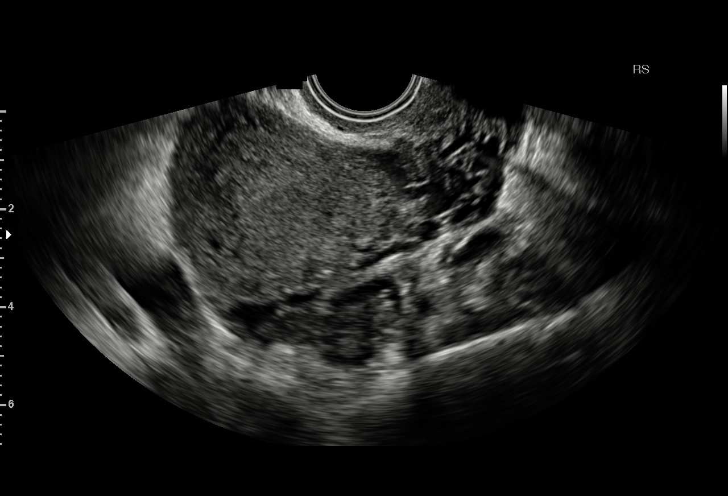
[im 42/75]
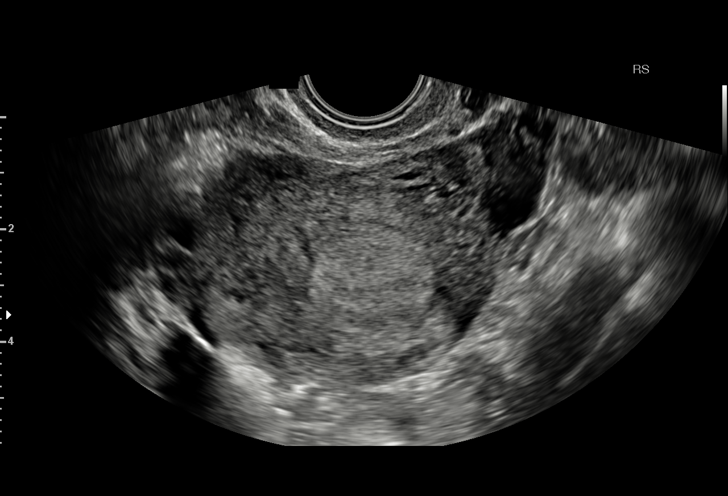
[im 47/75]
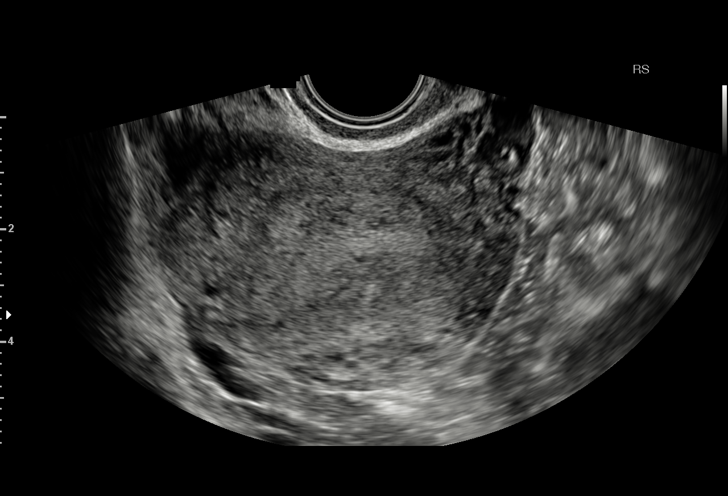
[im 53/75]
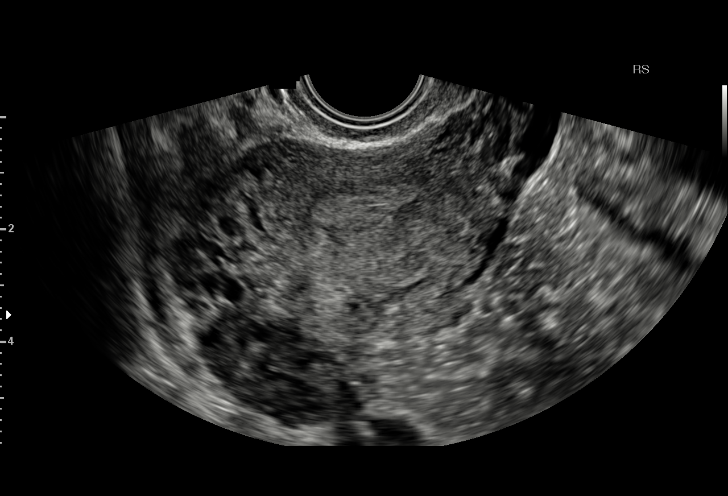
[im 58/75]
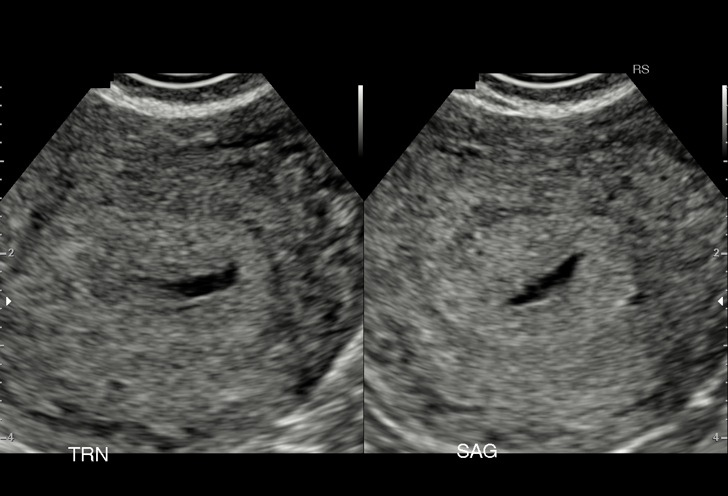
[im 64/75]
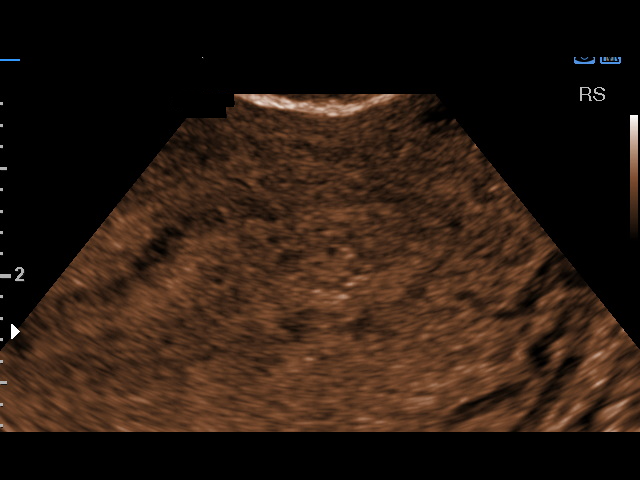
[im 69/75]
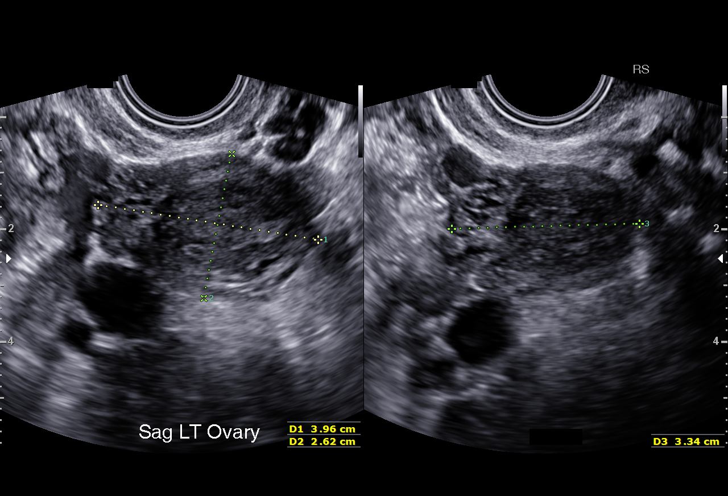
[im 75/75]
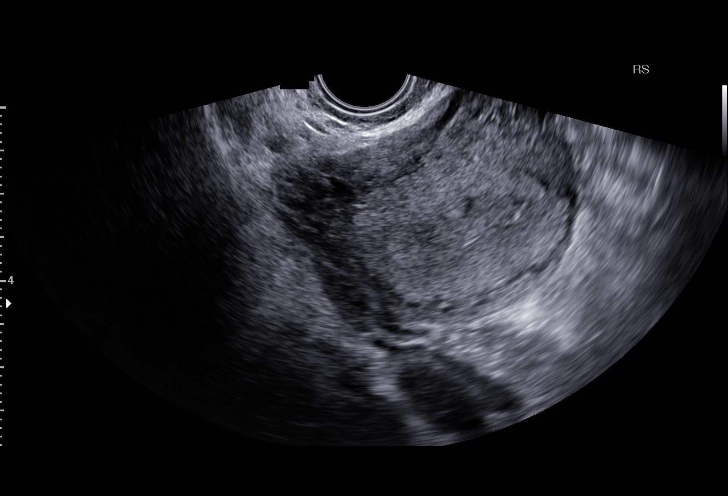

[15 of 28 positions shown; findings below may reference images not displayed]

FINDINGS: Intrauterine gestational sac: Visualized/normal in shape.

Yolk sac:  No

Embryo:  No

MSD: 2.8 mm   5 w   0  d

Subchorionic hemorrhage: A small amount of subchorionic hemorrhage
is noted, measuring 1.0 x 0.2 x 0.8 cm.

Maternal uterus/adnexae: There also appears to be a tiny endometrial
cyst, measuring 0.4 cm. The ovaries are unremarkable in appearance.
The right ovary measures 3.2 x 1.3 x 2.1 cm, while the left ovary
measures 4.0 x 2.6 x 3.3 cm. No suspicious adnexal masses are seen;
there is no evidence for ovarian torsion.

No free fluid is seen within the pelvic cul-de-sac.
IMPRESSION: 1. Single intrauterine gestational sac noted, with a mean sac
diameter of 3 mm, corresponding to a gestational age of 5 weeks 0
days. This matches the gestational age of 4 weeks 3 days by LMP,
reflecting an estimated delivery November 23, 2015. No yolk sac
or embryo is yet seen, within normal limits.
2. Small amount of subchorionic hemorrhage noted.
3. Tiny endometrial cyst noted.

## 2018-07-30 ENCOUNTER — Other Ambulatory Visit: Payer: Self-pay

## 2018-07-30 ENCOUNTER — Emergency Department (HOSPITAL_COMMUNITY)

## 2018-07-30 ENCOUNTER — Emergency Department (HOSPITAL_COMMUNITY)
Admission: EM | Admit: 2018-07-30 | Discharge: 2018-07-31 | Disposition: A | Discharge status: Home / Self Care | Attending: Emergency Medicine | Admitting: Emergency Medicine

## 2018-07-30 DIAGNOSIS — G43109 Migraine with aura, not intractable, without status migrainosus: Secondary | ICD-10-CM | POA: Diagnosis not present

## 2018-07-30 DIAGNOSIS — Z79899 Other long term (current) drug therapy: Secondary | ICD-10-CM | POA: Insufficient documentation

## 2018-07-30 DIAGNOSIS — R0602 Shortness of breath: Secondary | ICD-10-CM | POA: Insufficient documentation

## 2018-07-30 DIAGNOSIS — R200 Anesthesia of skin: Secondary | ICD-10-CM | POA: Diagnosis present

## 2018-07-30 DIAGNOSIS — J45901 Unspecified asthma with (acute) exacerbation: Secondary | ICD-10-CM | POA: Diagnosis not present

## 2018-07-30 DIAGNOSIS — R0789 Other chest pain: Secondary | ICD-10-CM | POA: Insufficient documentation

## 2018-07-30 LAB — CBC
HCT: 43.2 % (ref 36.0–46.0)
Hemoglobin: 13.4 g/dL (ref 12.0–15.0)
MCH: 25 pg — ABNORMAL LOW (ref 26.0–34.0)
MCHC: 31 g/dL (ref 30.0–36.0)
MCV: 80.6 fL (ref 80.0–100.0)
Platelets: 245 10*3/uL (ref 150–400)
RBC: 5.36 MIL/uL — ABNORMAL HIGH (ref 3.87–5.11)
RDW: 14.5 % (ref 11.5–15.5)
WBC: 7.1 10*3/uL (ref 4.0–10.5)
nRBC: 0 % (ref 0.0–0.2)

## 2018-07-30 LAB — I-STAT BETA HCG BLOOD, ED (MC, WL, AP ONLY): I-stat hCG, quantitative: <5 m[IU]/mL (ref <5)

## 2018-07-30 MED ORDER — SODIUM CHLORIDE 0.9% FLUSH
3.0000 mL | Freq: Once | INTRAVENOUS | Status: AC
  Administered 2018-07-31: 3 mL via INTRAVENOUS

## 2018-07-30 NOTE — ED Triage Notes (Signed)
Per pt she has been having chest pain x 1 week with tingling in her right side of her face x 4 days. No extremity weakness. All grips equal. Pt says hurts to take a deep breath. Pt saying hurts in the center of her chest. No Distress noted at this time

## 2018-07-31 ENCOUNTER — Other Ambulatory Visit: Payer: Self-pay

## 2018-07-31 ENCOUNTER — Emergency Department (HOSPITAL_COMMUNITY)

## 2018-07-31 LAB — TSH: TSH: 1.285 u[IU]/mL (ref 0.350–4.500)

## 2018-07-31 LAB — TROPONIN I: Troponin I: <0.03 ng/mL (ref <0.03)

## 2018-07-31 LAB — BASIC METABOLIC PANEL
Anion gap: 11 (ref 5–15)
BUN: 7 mg/dL (ref 6–20)
CO2: 22 mmol/L (ref 22–32)
Calcium: 9.7 mg/dL (ref 8.9–10.3)
Chloride: 105 mmol/L (ref 98–111)
Creatinine, Ser: 0.78 mg/dL (ref 0.44–1.00)
GFR calc Af Amer: >60 mL/min (ref >60)
GFR calc non Af Amer: >60 mL/min (ref >60)
Glucose, Bld: 100 mg/dL — ABNORMAL HIGH (ref 70–99)
Potassium: 3.8 mmol/L (ref 3.5–5.1)
Sodium: 138 mmol/L (ref 135–145)

## 2018-07-31 LAB — T4, FREE: Free T4: 0.79 ng/dL — ABNORMAL LOW (ref 0.82–1.77)

## 2018-07-31 MED ORDER — AEROCHAMBER PLUS FLO-VU SMALL MISC
1.0000 | Freq: Once | Status: DC
  Filled 2018-07-31: qty 1

## 2018-07-31 MED ORDER — SUMATRIPTAN SUCCINATE 100 MG PO TABS: 100.0000 mg | ORAL_TABLET | ORAL | 0 refills | Status: DC | PRN

## 2018-07-31 MED ORDER — DIPHENHYDRAMINE HCL 50 MG/ML IJ SOLN
25.0000 mg | Freq: Once | INTRAMUSCULAR | Status: AC
  Administered 2018-07-31: 25 mg via INTRAVENOUS
  Filled 2018-07-31: qty 1

## 2018-07-31 MED ORDER — ALBUTEROL SULFATE HFA 108 (90 BASE) MCG/ACT IN AERS
8.0000 | INHALATION_SPRAY | Freq: Once | RESPIRATORY_TRACT | Status: AC
  Administered 2018-07-31: 8 via RESPIRATORY_TRACT
  Filled 2018-07-31: qty 6.7

## 2018-07-31 MED ORDER — SODIUM CHLORIDE 0.9 % IV BOLUS
500.0000 mL | Freq: Once | INTRAVENOUS | Status: AC
  Administered 2018-07-31: 500 mL via INTRAVENOUS

## 2018-07-31 MED ORDER — KETOROLAC TROMETHAMINE 30 MG/ML IJ SOLN
30.0000 mg | Freq: Once | INTRAMUSCULAR | Status: AC
  Administered 2018-07-31: 30 mg via INTRAVENOUS
  Filled 2018-07-31: qty 1

## 2018-07-31 MED ORDER — PROCHLORPERAZINE EDISYLATE 10 MG/2ML IJ SOLN
10.0000 mg | Freq: Once | INTRAMUSCULAR | Status: AC
  Administered 2018-07-31: 10 mg via INTRAVENOUS
  Filled 2018-07-31: qty 2

## 2018-07-31 MED ORDER — FLUTICASONE-SALMETEROL 500-50 MCG/DOSE IN AEPB: 1.0000 | INHALATION_SPRAY | Freq: Two times a day (BID) | RESPIRATORY_TRACT | 2 refills | Status: DC
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
## 2018-07-31 NOTE — Discharge Instructions (Addendum)
Thank you for allowing me to care for you today in the Emergency Department.   Your head CT was negative. I suspect since your symptoms resolved that you were having a complicated migraine.   You can take 1 tablet of sumatriptan if you feel a headache coming on.  If your headache does not resolve in 2 hours, you may take 1 additional tablet.  Call to schedule a follow-up appointment with neurology.  Their contact information is listed above.  Use your albuterol inhaler with a spacer 2 puffs every 4 hours as needed for shortness of breath or chest tightness.  I also given you a refill of your home Advair.  Call the number on your discharge paperwork to get established with a primary care provider to ensure that you get additional refills of this medication.  Return to the emergency department if you develop respiratory distress, if your fingers or lips turn blue, if you develop a high fever with chest pain or shortness of breath, if you develop new numbness, weakness, changes in your vision, a persistent headache that does not go away, or other new, concerning symptoms.

## 2018-08-01 LAB — T3, FREE: T3, Free: 3 pg/mL (ref 2.0–4.4)

## 2019-01-22 ENCOUNTER — Encounter (HOSPITAL_COMMUNITY): Payer: Self-pay | Admitting: *Deleted

## 2019-01-22 ENCOUNTER — Emergency Department (HOSPITAL_COMMUNITY)
Admission: EM | Admit: 2019-01-22 | Discharge: 2019-01-22 | Disposition: A | Discharge status: Home / Self Care | Attending: Emergency Medicine | Admitting: Emergency Medicine

## 2019-01-22 ENCOUNTER — Emergency Department (HOSPITAL_COMMUNITY)

## 2019-01-22 DIAGNOSIS — M25512 Pain in left shoulder: Secondary | ICD-10-CM | POA: Insufficient documentation

## 2019-01-22 DIAGNOSIS — Y9389 Activity, other specified: Secondary | ICD-10-CM | POA: Diagnosis not present

## 2019-01-22 DIAGNOSIS — R1032 Left lower quadrant pain: Secondary | ICD-10-CM | POA: Insufficient documentation

## 2019-01-22 DIAGNOSIS — M542 Cervicalgia: Secondary | ICD-10-CM | POA: Insufficient documentation

## 2019-01-22 DIAGNOSIS — Y999 Unspecified external cause status: Secondary | ICD-10-CM | POA: Diagnosis not present

## 2019-01-22 DIAGNOSIS — J45909 Unspecified asthma, uncomplicated: Secondary | ICD-10-CM | POA: Diagnosis not present

## 2019-01-22 DIAGNOSIS — Y92411 Interstate highway as the place of occurrence of the external cause: Secondary | ICD-10-CM | POA: Insufficient documentation

## 2019-01-22 LAB — I-STAT BETA HCG BLOOD, ED (MC, WL, AP ONLY): I-stat hCG, quantitative: <5 m[IU]/mL (ref <5)

## 2019-01-22 MED ORDER — CYCLOBENZAPRINE HCL 10 MG PO TABS: 10.0000 mg | ORAL_TABLET | Freq: Two times a day (BID) | ORAL | 0 refills | Status: DC | PRN

## 2019-01-22 MED ORDER — IOHEXOL 300 MG/ML  SOLN
100.0000 mL | Freq: Once | INTRAMUSCULAR | Status: AC | PRN
  Administered 2019-01-22: 100 mL via INTRAVENOUS

## 2019-01-22 MED ORDER — IBUPROFEN 600 MG PO TABS: 600.0000 mg | ORAL_TABLET | Freq: Four times a day (QID) | ORAL | 0 refills | Status: DC | PRN

## 2019-01-22 MED ORDER — SODIUM CHLORIDE (PF) 0.9 % IJ SOLN
INTRAMUSCULAR | Status: AC
  Filled 2019-01-22: qty 50

## 2019-01-22 MED ORDER — MORPHINE SULFATE (PF) 4 MG/ML IV SOLN
4.0000 mg | Freq: Once | INTRAVENOUS | Status: AC
  Administered 2019-01-22: 4 mg via INTRAVENOUS
  Filled 2019-01-22: qty 1
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
## 2019-03-10 ENCOUNTER — Other Ambulatory Visit: Payer: Self-pay

## 2019-03-10 ENCOUNTER — Encounter (HOSPITAL_COMMUNITY): Payer: Self-pay | Admitting: Urgent Care

## 2019-03-10 ENCOUNTER — Ambulatory Visit (HOSPITAL_COMMUNITY)
Admission: EM | Admit: 2019-03-10 | Discharge: 2019-03-10 | Disposition: A | Discharge status: Home / Self Care | Attending: Internal Medicine | Admitting: Internal Medicine

## 2019-03-10 DIAGNOSIS — R109 Unspecified abdominal pain: Secondary | ICD-10-CM

## 2019-03-10 DIAGNOSIS — R110 Nausea: Secondary | ICD-10-CM | POA: Diagnosis not present

## 2019-03-10 DIAGNOSIS — N809 Endometriosis, unspecified: Secondary | ICD-10-CM

## 2019-03-10 DIAGNOSIS — N921 Excessive and frequent menstruation with irregular cycle: Secondary | ICD-10-CM

## 2019-03-10 LAB — POCT URINALYSIS DIP (DEVICE)
Bilirubin Urine: NEGATIVE
Glucose, UA: NEGATIVE mg/dL
Ketones, ur: NEGATIVE mg/dL
Leukocytes,Ua: NEGATIVE
Nitrite: NEGATIVE
Protein, ur: NEGATIVE mg/dL
Specific Gravity, Urine: 1.02 (ref 1.005–1.030)
Urobilinogen, UA: 0.2 mg/dL (ref 0.0–1.0)
pH: 7 (ref 5.0–8.0)

## 2019-03-10 MED ORDER — MEGESTROL ACETATE 40 MG PO TABS: 80.0000 mg | ORAL_TABLET | Freq: Every day | ORAL | 0 refills | Status: DC

## 2019-03-10 MED ORDER — ONDANSETRON 8 MG PO TBDP: 8.0000 mg | ORAL_TABLET | Freq: Three times a day (TID) | ORAL | 0 refills | Status: DC | PRN

## 2019-03-10 MED ORDER — ONDANSETRON 4 MG PO TBDP
8.0000 mg | ORAL_TABLET | Freq: Once | ORAL | Status: AC
  Administered 2019-03-10: 8 mg via ORAL

## 2019-03-10 MED ORDER — NAPROXEN 500 MG PO TABS: 500.0000 mg | ORAL_TABLET | Freq: Two times a day (BID) | ORAL | 0 refills | Status: DC

## 2019-03-10 MED ORDER — ONDANSETRON 4 MG PO TBDP
ORAL_TABLET | ORAL | Status: AC
  Filled 2019-03-10: qty 2

## 2019-03-10 NOTE — ED Provider Notes (Signed)
[facility]   MRN: [id number] DOB: 19-May-1995  Subjective:   Melanie Cain is a 23 y.o. female presenting for 1 month history of persistent heavy vaginal bleeding.  Patient reports that she has had to use multiple pad changes every hour in the past week.  It has improved today but patient still has diffuse abdominal cramping, now having nausea with a lot of gagging, one episode of vomiting.  Patient has a history of endometriosis which was diagnosed in [place].  They attempted surgical correction out in [place] but since moving to [place] she has had more difficulty with her endometriosis.  She has tried to get in touch with her gynecologist but has been unsuccessful.  Does not take any chronic medications but does have Nexplanon implant.  She is supposed to be on iron supplementation as well but has not started this.  Takes Nexplanon.    Allergies  Allergen Reactions  . Montelukast Sodium Other (See Comments)    Reaction:  Hallucinations   . Prednisone Swelling and Other (See Comments)    Reaction:  Tongue swelling  . Tessalon [Benzonatate] Hives    Past Medical History:  Diagnosis Date  . Allergy   . Anemia   . Asthma   . Menometrorrhagia   . MRSA (methicillin resistant staph aureus) culture positive 2012   culture from abscess     Past Surgical History:  Procedure Laterality Date  . NO PAST SURGERIES      Family History  Problem Relation Age of Onset  . Asthma Other   . Cancer Other   . Asthma Mother   . Hypertension Mother   . Asthma Father   . Asthma Sister        2 sisters  . Cancer Maternal Grandmother        breast cancer  . Hearing loss Maternal Grandmother     Social History   Tobacco Use  . Smoking status: Never Smoker  . Smokeless tobacco: Never Used  Substance Use Topics  . Alcohol use: No  . Drug use: No    Review of Systems  Constitutional: Negative for fever and malaise/fatigue.  HENT: Negative for congestion,  ear pain, sinus pain and sore throat.   Eyes: Negative for discharge and redness.  Respiratory: Negative for cough, hemoptysis, shortness of breath and wheezing.   Cardiovascular: Negative for chest pain.  Gastrointestinal: Positive for abdominal pain, nausea and vomiting. Negative for blood in stool, constipation and diarrhea.  Genitourinary: Negative for dysuria, flank pain, frequency, hematuria and urgency.  Musculoskeletal: Negative for myalgias.  Skin: Negative for rash.  Neurological: Negative for dizziness, weakness and headaches.  Psychiatric/Behavioral: Negative for depression and substance abuse.     Objective:   Vitals: BP 127/77 (BP Location: Left Arm)   Pulse 70   Temp 98.6 F (37 C) (Oral)   Resp 16   LMP 02/10/2019   SpO2 100%   Physical Exam Constitutional:      General: She is not in acute distress.    Appearance: Normal appearance. She is well-developed. She is not ill-appearing, toxic-appearing or diaphoretic.  HENT:     Head: Normocephalic and atraumatic.     Nose: Nose normal.     Mouth/Throat:     Mouth: Mucous membranes are moist.  Eyes:     Extraocular Movements: Extraocular movements intact.     Pupils: Pupils are equal, round, and reactive to light.  Cardiovascular:     Rate and Rhythm: Normal rate  and regular rhythm.     Pulses: Normal pulses.     Heart sounds: Normal heart sounds. No murmur. No friction rub. No gallop.   Pulmonary:     Effort: Pulmonary effort is normal. No respiratory distress.     Breath sounds: Normal breath sounds. No stridor. No wheezing, rhonchi or rales.  Abdominal:     Tenderness: There is generalized abdominal tenderness and tenderness in the periumbilical area. There is no right CVA tenderness, left CVA tenderness, guarding or rebound.  Skin:    General: Skin is warm and dry.     Findings: No rash.  Neurological:     Mental Status: She is alert and oriented to person, place, and time.  Psychiatric:        Mood  and Affect: Mood normal.        Behavior: Behavior normal.        Thought Content: Thought content normal.     Results for orders placed or performed during the hospital encounter of 03/10/19 (from the past 24 hour(s))  POCT urinalysis dip (device)     Status: Abnormal   Collection Time: 03/10/19  7:48 PM  Result Value Ref Range   Glucose, UA NEGATIVE NEGATIVE mg/dL   Bilirubin Urine NEGATIVE NEGATIVE   Ketones, ur NEGATIVE NEGATIVE mg/dL   Specific Gravity, Urine 1.020 1.005 - 1.030   Hgb urine dipstick LARGE (A) NEGATIVE   pH 7.0 5.0 - 8.0   Protein, ur NEGATIVE NEGATIVE mg/dL   Urobilinogen, UA 0.2 0.0 - 1.0 mg/dL   Nitrite NEGATIVE NEGATIVE   Leukocytes,Ua NEGATIVE NEGATIVE    Assessment and Plan :   1. Endometriosis   2. Abdominal cramping   3. Metrorrhagia   4. Nausea     Start Megace to help with heavy irregular vaginal bleeding likely secondary to her endometriosis.  Use naproxen for pelvic pains and cramps.  Zofran for nausea and vomiting.  Emphasized need to continue to try and follow-up with her gynecologist. Counseled patient on potential for adverse effects with medications prescribed/recommended today, strict ER and return-to-clinic precautions discussed, patient verbalized understanding.    Wallis Bamberg, [place] 03/10/19 [phone]

## 2019-03-10 NOTE — ED Triage Notes (Signed)
Pt states menstrual bleeding heavy yesterday requiring her to change her pad q 30-60 min. Bleeding improved today changing q 90-120 min.

## 2019-03-10 NOTE — ED Triage Notes (Signed)
Pt c/o n/v/d onset today with emesis x1; "spitting up". Menstrual cycle started 12/01 and is ongoing; pt has h/o of irreg menses with prolonged periods and endometriosis. Denies sore throat, or fever.

## 2019-03-18 ENCOUNTER — Other Ambulatory Visit: Payer: Self-pay

## 2019-03-18 DIAGNOSIS — Z20822 Contact with and (suspected) exposure to covid-19: Secondary | ICD-10-CM

## 2019-03-19 LAB — NOVEL CORONAVIRUS, NAA: SARS-CoV-2, NAA: NOT DETECTED

## 2019-04-19 ENCOUNTER — Other Ambulatory Visit: Payer: Self-pay

## 2019-04-19 ENCOUNTER — Ambulatory Visit (HOSPITAL_COMMUNITY)
Admission: EM | Admit: 2019-04-19 | Discharge: 2019-04-19 | Disposition: A | Discharge status: Home / Self Care | Attending: Family Medicine | Admitting: Family Medicine

## 2019-04-19 ENCOUNTER — Encounter (HOSPITAL_COMMUNITY): Payer: Self-pay | Admitting: Emergency Medicine

## 2019-04-19 DIAGNOSIS — R102 Pelvic and perineal pain: Secondary | ICD-10-CM | POA: Diagnosis present

## 2019-04-19 DIAGNOSIS — R300 Dysuria: Secondary | ICD-10-CM | POA: Insufficient documentation

## 2019-04-19 DIAGNOSIS — N939 Abnormal uterine and vaginal bleeding, unspecified: Secondary | ICD-10-CM | POA: Diagnosis present

## 2019-04-19 DIAGNOSIS — Z3202 Encounter for pregnancy test, result negative: Secondary | ICD-10-CM | POA: Diagnosis not present

## 2019-04-19 DIAGNOSIS — N809 Endometriosis, unspecified: Secondary | ICD-10-CM | POA: Diagnosis present

## 2019-04-19 LAB — CBC WITH DIFFERENTIAL/PLATELET
Abs Immature Granulocytes: 0.01 10*3/uL (ref 0.00–0.07)
Basophils Absolute: 0 10*3/uL (ref 0.0–0.1)
Basophils Relative: 0 %
Eosinophils Absolute: 0.1 10*3/uL (ref 0.0–0.5)
Eosinophils Relative: 1 %
HCT: 48.3 % — ABNORMAL HIGH (ref 36.0–46.0)
Hemoglobin: 15.4 g/dL — ABNORMAL HIGH (ref 12.0–15.0)
Immature Granulocytes: 0 %
Lymphocytes Relative: 32 %
Lymphs Abs: 2 10*3/uL (ref 0.7–4.0)
MCH: 25.9 pg — ABNORMAL LOW (ref 26.0–34.0)
MCHC: 31.9 g/dL (ref 30.0–36.0)
MCV: 81.2 fL (ref 80.0–100.0)
Monocytes Absolute: 0.4 10*3/uL (ref 0.1–1.0)
Monocytes Relative: 6 %
Neutro Abs: 3.8 10*3/uL (ref 1.7–7.7)
Neutrophils Relative %: 61 %
Platelets: 281 10*3/uL (ref 150–400)
RBC: 5.95 MIL/uL — ABNORMAL HIGH (ref 3.87–5.11)
RDW: 16.2 % — ABNORMAL HIGH (ref 11.5–15.5)
WBC: 6.3 10*3/uL (ref 4.0–10.5)
nRBC: 0 % (ref 0.0–0.2)

## 2019-04-19 LAB — POCT URINALYSIS DIP (DEVICE)
Bilirubin Urine: NEGATIVE
Glucose, UA: NEGATIVE mg/dL
Nitrite: NEGATIVE
Protein, ur: 100 mg/dL — AB
Specific Gravity, Urine: >=1.03 (ref 1.005–1.030)
Urobilinogen, UA: 0.2 mg/dL (ref 0.0–1.0)
pH: 5.5 (ref 5.0–8.0)

## 2019-04-19 LAB — POCT PREGNANCY, URINE: Preg Test, Ur: NEGATIVE

## 2019-04-19 LAB — POC URINE PREG, ED: Preg Test, Ur: NEGATIVE

## 2019-04-19 MED ORDER — NAPROXEN 500 MG PO TABS: 500.0000 mg | ORAL_TABLET | Freq: Two times a day (BID) | ORAL | 0 refills | Status: DC

## 2019-04-19 MED ORDER — KETOROLAC TROMETHAMINE 60 MG/2ML IM SOLN
INTRAMUSCULAR | Status: AC
  Filled 2019-04-19: qty 2

## 2019-04-19 MED ORDER — KETOROLAC TROMETHAMINE 60 MG/2ML IM SOLN
60.0000 mg | Freq: Once | INTRAMUSCULAR | Status: AC
  Administered 2019-04-19: 60 mg via INTRAMUSCULAR

## 2019-04-19 MED ORDER — NITROFURANTOIN MONOHYD MACRO 100 MG PO CAPS: 100.0000 mg | ORAL_CAPSULE | Freq: Two times a day (BID) | ORAL | 0 refills | Status: DC

## 2019-04-19 NOTE — ED Triage Notes (Signed)
Pt c/o lower pelvic pain, states she has endometriosis and feels like it flairs up every time she has her period. Started her period in Jan, and still is bleeding. States she also feels like she has a UTI.

## 2019-04-19 NOTE — ED Notes (Signed)
Patient states unable to walk due to the pain, patient brought back to room in wheelchair.  Drinking and eating snack in lobby.

## 2019-04-19 NOTE — Discharge Instructions (Addendum)
Call the gynecology office listed on your paperwork. Your condition request further diagnostic work-up and appropriate management of endometriosis which requires follow-up at a gynecology office. [phone] call the [facility] clinic to schedule new patient appointment.
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
## 2019-04-20 LAB — URINE CULTURE
Culture: 70000 — AB
Special Requests: NORMAL

## 2019-05-18 ENCOUNTER — Other Ambulatory Visit: Payer: Self-pay

## 2019-05-18 ENCOUNTER — Ambulatory Visit (INDEPENDENT_AMBULATORY_CARE_PROVIDER_SITE_OTHER): Admitting: Family Medicine

## 2019-05-18 ENCOUNTER — Other Ambulatory Visit (HOSPITAL_COMMUNITY)
Admission: RE | Admit: 2019-05-18 | Discharge: 2019-05-18 | Disposition: A | Discharge status: Home / Self Care | Source: Ambulatory Visit | Attending: Family Medicine | Admitting: Family Medicine

## 2019-05-18 ENCOUNTER — Encounter: Payer: Self-pay | Admitting: Family Medicine

## 2019-05-18 VITALS — BP 124/72 | HR 60 | Wt 154.7 lb

## 2019-05-18 DIAGNOSIS — N390 Urinary tract infection, site not specified: Secondary | ICD-10-CM

## 2019-05-18 DIAGNOSIS — N809 Endometriosis, unspecified: Secondary | ICD-10-CM | POA: Diagnosis not present

## 2019-05-18 DIAGNOSIS — Z01419 Encounter for gynecological examination (general) (routine) without abnormal findings: Secondary | ICD-10-CM | POA: Insufficient documentation

## 2019-05-18 DIAGNOSIS — Z113 Encounter for screening for infections with a predominantly sexual mode of transmission: Secondary | ICD-10-CM | POA: Diagnosis not present

## 2019-05-18 NOTE — Progress Notes (Signed)
GYNECOLOGY OFFICE NOTE  History:  24 y.o. [id number] here today for ER follow up concerning endometriosis. She was seen in the [facility] on 2/7 and given Naproxen and Megace for pelvic pain and AUB. She was also given Macrobid for suspected UTI.  Endometriosis: This is a chronic problem. Was diagnosed with endometriosis after having her second child- age 52. She was originally tried on birth control pills- does not remember which kind. She had laparoscopic surgery in March of 2020- and reports that she had endometrial tissue laser ablated.   She does utilize [employer] risk [employer] for birth control- it was placed September 2020.  She started getting her periods at age 59. Her periods have never been regular. She reports having heavy bleeding all the time. Her longest period lasted 3 months. She has noted some blood clots in her periods since getting her nexplanon. The most recent time her period stopped was from the Megace prescribed in the ER, her last dose was 04/25/19. She has severe cramps that motrin doesn't seem to touch, however the naproxen prescribed from the ER did help a lot. She has only taken 4 doses total.  UTIs: she says she gets a UTI every month. She has had at least 12 in the last year. She was recently prescribed Macrobid in the ER, her urine was cultures and found to be contaminated.  Past Medical History:  Diagnosis Date  . Allergy   . Anemia   . Asthma   . Menometrorrhagia   . MRSA (methicillin resistant staph aureus) culture positive 2012   culture from abscess    Past Surgical History:  Procedure Laterality Date  . NO PAST SURGERIES       Current Outpatient Medications:  .  megestrol (MEGACE) 40 MG tablet, Take 2 tablets (80 mg total) by mouth daily. (Patient not taking: Reported on 04/19/2019), Disp: 10 tablet, Rfl: 0 .  naproxen (NAPROSYN) 500 MG tablet, Take 1 tablet (500 mg total) by mouth 2 (two) times daily with a meal. (Patient not taking: Reported on 05/18/2019), Disp: 60  tablet, Rfl: 0 .  ondansetron (ZOFRAN-ODT) 8 MG disintegrating tablet, Take 1 tablet (8 mg total) by mouth every 8 (eight) hours as needed for nausea or vomiting. (Patient not taking: Reported on 04/19/2019), Disp: 30 tablet, Rfl: 0  The following portions of the patient's history were reviewed and updated as appropriate: allergies, current medications, past family history, past medical history, past social history, past surgical history and problem list.   Review of Systems:  Pertinent items noted in HPI and remainder of comprehensive ROS otherwise negative.   Objective:  Physical Exam BP 124/72   Pulse 60   Wt 154 lb 11.2 oz (70.2 kg)   LMP 04/17/2019 (Exact Date)   Breastfeeding No   BMI 24.97 kg/m  CONSTITUTIONAL: Well-developed, well-nourished female in no acute distress.  HENT:  Normocephalic, atraumatic. External right and left ear normal. Oropharynx is clear and moist EYES: Conjunctivae and EOM are normal. Pupils are equal, round, and reactive to light. No scleral icterus.  NECK: Normal range of motion, supple, no masses SKIN: Skin is warm and dry. No rash noted. Not diaphoretic. No erythema. No pallor. NEUROLOGIC: Alert and oriented to person, place, and time. Normal reflexes, muscle tone coordination. No cranial nerve deficit noted. PSYCHIATRIC: Normal mood and affect. Normal behavior. Normal judgment and thought content. CARDIOVASCULAR: Normal heart rate noted RESPIRATORY: Effort and breath sounds normal, no problems with respiration noted ABDOMEN: Soft, no distention noted.  PELVIC: Normal appearing external genitalia; normal appearing vaginal mucosa and cervix.  No abnormal discharge noted.  Pelvic cultures obtained. Normal uterine size, no other palpable masses, no uterine or adnexal tenderness. MUSCULOSKELETAL: Normal range of motion. No edema noted.  Exam done with chaperone present.  Labs and Imaging No results found.  Assessment & Plan:  1. Endometriosis -  Discussed with Dr. Ilda Basset - Patient signed record release, will obtain Op Note concerning her surgery - Currently has Naproxen which helps with pain - Has Nexplanon in place for birth control - Will follow up with MD  2. Well woman exam with routine gynecological exam - Pap done in 2019- normal per patient, will obtain records - Cervicovaginal ancillary only( [place])  3. Recurrent UTI - Has had 12 UTIs in the last year, recently prescribed Macrobid from ER- culture looked contaminated - Will send microscopic UA and UC - Consider broadening DDX to include interstitial cysitis - If UC+, may need suppressive therapy for UTIs - Urinalysis, Routine w reflex microscopic - Urine Culture  4. Screening examination for STD (sexually transmitted disease) - HIV antibody (with reflex) - Hepatitis C Antibody - Hepatitis B surface antibody,qualitative - RPR   Routine preventative health maintenance measures emphasized. Please refer to After Visit Summary for other counseling recommendations.   Return in about 4 weeks (around 06/15/2019) for Endometriosis.  Total face-to-face time with patient: 23 minutes. Over 50% of encounter was spent on counseling and coordination of care.  Merilyn Baba, DO OB Fellow, Faculty [facility] 05/18/2019 1:06 PM

## 2019-05-18 NOTE — Patient Instructions (Signed)
Endometriosis  Endometriosis is a condition in which the tissue that lines the uterus (endometrium) grows outside of its normal location. The tissue may grow in many locations close to the uterus, but it commonly grows on the ovaries, fallopian tubes, vagina, or bowel. When the uterus sheds the endometrium every menstrual cycle, there is bleeding wherever the endometrial tissue is located. This can cause pain because blood is irritating to tissues that are not normally exposed to it. What are the causes? The cause of endometriosis is not known. What increases the risk? You may be more likely to develop endometriosis if you:  Have a family history of endometriosis.  Have never given birth.  Started your period at age 10 or younger.  Have high levels of estrogen in your body.  Were exposed to a certain medicine (diethylstilbestrol) before you were born (in utero).  Had low birth weight.  Were born as a twin, triplet, or other multiple.  Have a BMI of less than 25. BMI is an estimate of body fat and is calculated from height and weight. What are the signs or symptoms? Often, there are no symptoms of this condition. If you do have symptoms, they may:  Vary depending on where your endometrial tissue is growing.  Occur during your menstrual period (most common) or midcycle.  Come and go, or you may go months with no symptoms at all.  Stop with menopause. Symptoms may include:  Pain in the back or abdomen.  Heavier bleeding during periods.  Pain during sex.  Painful bowel movements.  Infertility.  Pelvic pain.  Bleeding more than once a month. How is this diagnosed? This condition is diagnosed based on your symptoms and a physical exam. You may have tests, such as:  Blood tests and urine tests. These may be done to help rule out other possible causes of your symptoms.  Ultrasound, to look for abnormal tissues.  An X-ray of the lower bowel (barium enema).  An  ultrasound that is done through the vagina (transvaginally).  CT scan.  MRI.  Laparoscopy. In this procedure, a lighted, pencil-sized instrument called a laparoscope is inserted into your abdomen through an incision. The laparoscope allows your health care provider to look at the organs inside your body and check for abnormal tissue to confirm the diagnosis. If abnormal tissue is found, your health care provider may remove a small piece of tissue (biopsy) to be examined under a microscope. How is this treated? Treatment for this condition may include:  Medicines to relieve pain, such as NSAIDs.  Hormone therapy. This involves using artificial (synthetic) hormones to reduce endometrial tissue growth. Your health care provider may recommend using a hormonal form of birth control, or other medicines.  Surgery. This may be done to remove abnormal endometrial tissue. ? In some cases, tissue may be removed using a laparoscope and a laser (laparoscopic laser treatment). ? In severe cases, surgery may be done to remove the fallopian tubes, uterus, and ovaries (hysterectomy). Follow these instructions at home:  Take over-the-counter and prescription medicines only as told by your health care provider.  Do not drive or use heavy machinery while taking prescription pain medicine.  Try to avoid activities that cause pain, including sexual activity.  Keep all follow-up visits as told by your health care provider. This is important. Contact a health care provider if:  You have pain in the area between your hip bones (pelvic area) that occurs: ? Before, during, or after your period. ?   In between your period and gets worse during your period. ? During or after sex. ? With bowel movements or urination, especially during your period.  You have problems getting pregnant.  You have a fever. Get help right away if:  You have severe pain that does not get better with medicine.  You have severe  nausea and vomiting, or you cannot eat without vomiting.  You have pain that affects only the lower, right side of your abdomen.  You have abdominal pain that gets worse.  You have abdominal swelling.  You have blood in your stool. This information is not intended to replace advice given to you by your health care provider. Make sure you discuss any questions you have with your health care provider. Document Revised: 02/08/2017 Document Reviewed: 07/30/2015 Elsevier Patient Education  2020 Elsevier Inc.  

## 2019-05-19 LAB — CERVICOVAGINAL ANCILLARY ONLY
Bacterial Vaginitis (gardnerella): POSITIVE — AB
Candida Glabrata: NEGATIVE
Candida Vaginitis: POSITIVE — AB
Chlamydia: NEGATIVE
Comment: NEGATIVE
Comment: NEGATIVE
Comment: NEGATIVE
Comment: NEGATIVE
Comment: NEGATIVE
Comment: NORMAL
Neisseria Gonorrhea: NEGATIVE
Trichomonas: NEGATIVE

## 2019-05-19 LAB — MICROSCOPIC EXAMINATION
Casts: NONE SEEN /lpf
WBC, UA: >30 /hpf — AB (ref 0–5)

## 2019-05-19 LAB — HEPATITIS C ANTIBODY: Hep C Virus Ab: <0.1 s/co ratio (ref 0.0–0.9)

## 2019-05-19 LAB — URINALYSIS, ROUTINE W REFLEX MICROSCOPIC
Bilirubin, UA: NEGATIVE
Glucose, UA: NEGATIVE
Ketones, UA: NEGATIVE
Nitrite, UA: NEGATIVE
Specific Gravity, UA: 1.027 (ref 1.005–1.030)
Urobilinogen, Ur: 1 mg/dL (ref 0.2–1.0)
pH, UA: 6.5 (ref 5.0–7.5)

## 2019-05-19 LAB — HIV ANTIBODY (ROUTINE TESTING W REFLEX): HIV Screen 4th Generation wRfx: NONREACTIVE

## 2019-05-19 LAB — HEPATITIS B SURFACE ANTIBODY,QUALITATIVE: Hep B Surface Ab, Qual: NONREACTIVE

## 2019-05-19 LAB — RPR: RPR Ser Ql: NONREACTIVE

## 2019-05-20 ENCOUNTER — Telehealth: Payer: Self-pay | Admitting: *Deleted

## 2019-05-20 ENCOUNTER — Other Ambulatory Visit: Payer: Self-pay | Admitting: Family Medicine

## 2019-05-20 DIAGNOSIS — B9689 Other specified bacterial agents as the cause of diseases classified elsewhere: Secondary | ICD-10-CM

## 2019-05-20 DIAGNOSIS — B3731 Acute candidiasis of vulva and vagina: Secondary | ICD-10-CM

## 2019-05-20 DIAGNOSIS — B373 Candidiasis of vulva and vagina: Secondary | ICD-10-CM

## 2019-05-20 DIAGNOSIS — N760 Acute vaginitis: Secondary | ICD-10-CM

## 2019-05-20 DIAGNOSIS — N3001 Acute cystitis with hematuria: Secondary | ICD-10-CM

## 2019-05-20 LAB — URINE CULTURE

## 2019-05-20 MED ORDER — NITROFURANTOIN MONOHYD MACRO 100 MG PO CAPS: 100.0000 mg | ORAL_CAPSULE | Freq: Two times a day (BID) | ORAL | 0 refills | Status: AC

## 2019-05-20 MED ORDER — FLUCONAZOLE 150 MG PO TABS: 150.0000 mg | ORAL_TABLET | ORAL | 0 refills | Status: AC

## 2019-05-20 NOTE — Telephone Encounter (Addendum)
-----   Message from Marlowe Alt, DO sent at 05/20/2019  2:47 PM EST ----- Script sent for Diflucan and Macrobid. She will need a follow up UA with microscopy in 6 weeks for microscopic hematuria. Can also send in script for BV (Metronidazole) if she wants it, I have outlined alternative therapies below.  1725  Per chart review, pt has read the detailed message sent to her by Dr. Salomon Mast regarding test results, prescriptions sent and alternative therapies. The note can be viewed with lab result on 05/18/19 11:32 for Microscopic Examination.

## 2019-05-20 NOTE — Progress Notes (Signed)
Script for [employer] and Diflucan sent  Marlowe Alt, DO OB Fellow, Faculty [facility] 05/20/2019 2:43 PM

## 2019-05-21 MED ORDER — METRONIDAZOLE 500 MG PO TABS: 500.0000 mg | ORAL_TABLET | Freq: Two times a day (BID) | ORAL | 0 refills | Status: DC

## 2019-05-27 ENCOUNTER — Encounter: Payer: Self-pay | Admitting: *Deleted

## 2019-05-29 ENCOUNTER — Other Ambulatory Visit: Payer: Self-pay

## 2019-05-29 ENCOUNTER — Ambulatory Visit: Admission: EM | Admit: 2019-05-29 | Discharge: 2019-05-29 | Disposition: A | Discharge status: Home / Self Care

## 2019-05-29 ENCOUNTER — Encounter: Payer: Self-pay | Admitting: Emergency Medicine

## 2019-05-29 DIAGNOSIS — J4521 Mild intermittent asthma with (acute) exacerbation: Secondary | ICD-10-CM

## 2019-05-29 DIAGNOSIS — R0789 Other chest pain: Secondary | ICD-10-CM | POA: Diagnosis not present

## 2019-05-29 MED ORDER — FAMOTIDINE 20 MG PO TABS: 20.0000 mg | ORAL_TABLET | Freq: Two times a day (BID) | ORAL | 0 refills | Status: DC

## 2019-05-29 MED ORDER — CETIRIZINE HCL 10 MG PO TABS: 10.0000 mg | ORAL_TABLET | Freq: Every day | ORAL | 0 refills | Status: DC

## 2019-05-29 MED ORDER — AZELASTINE HCL 0.1 % NA SOLN: 2.0000 | Freq: Two times a day (BID) | NASAL | 0 refills | Status: DC
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
## 2019-05-29 NOTE — Discharge Instructions (Addendum)
No alarming signs on exam.  As discussed, this could be due to asthma exacerbation, drainage from the sinuses, or acid reflux.  Continue albuterol as needed, please use albuterol before bedtime.  Continue Flonase, and add on azelastine.  Zyrtec daily, and supplement with Benadryl at night if needed.  Start Pepcid for possible acid reflux. Keep hydrated, urine should be clear to pale yellow in color.  Follow-up here with PCP if symptoms not improving.  Otherwise if significant worsening symptoms, swelling to throat, leaning forward to breathe, unable to breathe, drooling, continue meds department for further evaluation.

## 2019-05-29 NOTE — ED Triage Notes (Signed)
Pt presents to [facility] for assessment of 1 week of asthma attacks and panic attacks happening when she wakes up in the morning.  Patient states she has the feeling of not being able to breathe and her throat closing up intermittently through out the day.  Also c/o hives all over starting 2 days ago.

## 2019-05-29 NOTE — ED Notes (Signed)
Patient able to ambulate independently  

## 2019-07-02 ENCOUNTER — Ambulatory Visit (INDEPENDENT_AMBULATORY_CARE_PROVIDER_SITE_OTHER): Admitting: *Deleted

## 2019-07-02 ENCOUNTER — Other Ambulatory Visit: Payer: Self-pay

## 2019-07-02 DIAGNOSIS — R319 Hematuria, unspecified: Secondary | ICD-10-CM

## 2019-07-02 LAB — POCT URINALYSIS DIP (DEVICE)
Bilirubin Urine: NEGATIVE
Glucose, UA: NEGATIVE mg/dL
Ketones, ur: NEGATIVE mg/dL
Leukocytes,Ua: NEGATIVE
Nitrite: NEGATIVE
Protein, ur: NEGATIVE mg/dL
Specific Gravity, Urine: 1.02 (ref 1.005–1.030)
Urobilinogen, UA: 1 mg/dL (ref 0.0–1.0)
pH: 7 (ref 5.0–8.0)

## 2019-07-02 NOTE — Progress Notes (Signed)
Here for urinalyis. Reports still bleeds  Heavy for 2 weeks, then stops a day or two. Then starts again.  Instructed to make follow up as requested by Dr. Hillery Hunter at last visit. She reports she is out of the megace since January. States she changes pad often as recommended by doctor - like 1- 1/5 hours and it is at least 1/2 full.  Will send todays visit notes to Dr. Hillery Hunter to see if she wants to refill megace or other. UA obtained , UA in office ran and sent to lab for UA with microscopic  Curran Lenderman,RN

## 2019-07-03 ENCOUNTER — Encounter: Payer: Self-pay | Admitting: *Deleted

## 2019-07-03 ENCOUNTER — Other Ambulatory Visit: Payer: Self-pay | Admitting: *Deleted

## 2019-07-03 DIAGNOSIS — N938 Other specified abnormal uterine and vaginal bleeding: Secondary | ICD-10-CM

## 2019-07-03 LAB — URINALYSIS, ROUTINE W REFLEX MICROSCOPIC
Bilirubin, UA: NEGATIVE
Glucose, UA: NEGATIVE
Ketones, UA: NEGATIVE
Leukocytes,UA: NEGATIVE
Nitrite, UA: NEGATIVE
Protein,UA: NEGATIVE
Specific Gravity, UA: 1.02 (ref 1.005–1.030)
Urobilinogen, Ur: 1 mg/dL (ref 0.2–1.0)
pH, UA: 7.5 (ref 5.0–7.5)

## 2019-07-03 LAB — MICROSCOPIC EXAMINATION
Casts: NONE SEEN /lpf
Epithelial Cells (non renal): >10 /hpf — AB (ref 0–10)

## 2019-07-03 MED ORDER — MEGESTROL ACETATE 40 MG PO TABS: 80.0000 mg | ORAL_TABLET | Freq: Every day | ORAL | 0 refills | Status: DC

## 2019-07-03 NOTE — Progress Notes (Signed)
OK to refill megace  Chart reviewed for nurse visit. Agree with plan of care.   Makylah Bossard L, DO 07/03/2019 9:43 AM

## 2019-07-23 ENCOUNTER — Encounter: Payer: Self-pay | Admitting: Obstetrics & Gynecology

## 2019-07-23 ENCOUNTER — Ambulatory Visit (INDEPENDENT_AMBULATORY_CARE_PROVIDER_SITE_OTHER): Admitting: Obstetrics & Gynecology

## 2019-07-23 ENCOUNTER — Other Ambulatory Visit: Payer: Self-pay

## 2019-07-23 VITALS — BP 120/83 | HR 71 | Ht 65.0 in | Wt 148.0 lb

## 2019-07-23 DIAGNOSIS — L739 Follicular disorder, unspecified: Secondary | ICD-10-CM

## 2019-07-23 DIAGNOSIS — N939 Abnormal uterine and vaginal bleeding, unspecified: Secondary | ICD-10-CM

## 2019-07-23 DIAGNOSIS — N809 Endometriosis, unspecified: Secondary | ICD-10-CM

## 2019-07-23 MED ORDER — NORETHINDRONE-ETH ESTRADIOL 1-35 MG-MCG PO TABS: 1.0000 | ORAL_TABLET | Freq: Every day | ORAL | 6 refills | Status: DC

## 2019-07-23 NOTE — Patient Instructions (Signed)
Return to clinic for any scheduled appointments or for any gynecologic concerns as needed.   

## 2019-07-23 NOTE — Progress Notes (Signed)
   GYNECOLOGY OFFICE VISIT NOTE  History:   Melanie Cain is a 24 y.o. [id number] here today for follow up of endometriosis associated AUB. Of note, she had laser ablation of lesions in March 2020. Nexplanon is in place, but still having having bleeding daily (can be light or moderate). No symptoms of anemia.  Also reports some irritation on her vulva skin s/p recent waxing, wants to be evaluated for this also. She denies any other concerns.    Past Medical History:  Diagnosis Date  . Allergy   . Anemia   . Asthma   . Menometrorrhagia   . MRSA (methicillin resistant staph aureus) culture positive 2012   culture from abscess    Past Surgical History:  Procedure Laterality Date  . NO PAST SURGERIES      The following portions of the patient's history were reviewed and updated as appropriate: allergies, current medications, past family history, past medical history, past social history, past surgical history and problem list.   Health Maintenance:  Normal pap in 2019. Done in [place].   Review of Systems:  Pertinent items noted in HPI and remainder of comprehensive ROS otherwise negative.  Physical Exam:  BP 120/83   Pulse 71   Ht 5\' 5"  (1.651 m)   Wt 148 lb (67.1 kg)   LMP 07/13/2019 (Exact Date)   BMI 24.63 kg/m  CONSTITUTIONAL: Well-developed, well-nourished female in no acute distress.  HEENT:  Normocephalic, atraumatic. External right and left ear normal. No scleral icterus.  NECK: Normal range of motion, supple, no masses noted on observation SKIN: No rash noted. Not diaphoretic. No erythema. No pallor. MUSCULOSKELETAL: Normal range of motion. No edema noted. NEUROLOGIC: Alert and oriented to person, place, and time. Normal muscle tone coordination. No cranial nerve deficit noted. PSYCHIATRIC: Normal mood and affect. Normal behavior. Normal judgment and thought content. CARDIOVASCULAR: Normal heart rate noted RESPIRATORY: Effort and breath sounds normal, no  problems with respiration noted ABDOMEN: No masses noted. No other overt distention noted.   PELVIC: External genitalia with some non-inflamed folliculitis lesions on her mons pubis. No erythema, no tenderness, no other lesions. Bloody discharge noted at introitus. Performed in the presence of a chaperone    Assessment and Plan:      1. Endometriosis 2. Abnormal uterine bleeding (AUB) Discussed possibility of being on Oril[place], but she has some mood disorders and we are worried about exacerbation of these symptoms.  She has tolerated OCPs in past.  Will try a combination of OCPs and Nexplanon to see if this can help her bleeding.  The other option may be Lupron +Aygestin but still worried about her mood disorders.   - norethindrone-ethinyl estradiol 1/35 (ORTHO-NOVUM) tablet; Take 1 tablet by mouth daily. Take active pills only in a continuous fashion.  Dispense: 3 Package; Refill: 6  3. Folliculitis Patient reassured. Skin care reviewed. Told to call for any worsening symptoms.  Routine preventative health maintenance measures emphasized. Please refer to After Visit Summary for other counseling recommendations.   Return in about 2 months (around 09/22/2019) for Followup OCP check, AUB, endometriosis.    Total face-to-face time with patient: 15 minutes.  Over 50% of encounter was spent on counseling and coordination of care.   09/24/2019, MD, FACOG Obstetrician & Gynecologist, [facility] for [facility]

## 2019-07-27 ENCOUNTER — Encounter: Payer: Self-pay | Admitting: Emergency Medicine

## 2019-07-27 ENCOUNTER — Ambulatory Visit
Admission: EM | Admit: 2019-07-27 | Discharge: 2019-07-27 | Disposition: A | Discharge status: Home / Self Care | Attending: Emergency Medicine | Admitting: Emergency Medicine

## 2019-07-27 ENCOUNTER — Other Ambulatory Visit: Payer: Self-pay

## 2019-07-27 DIAGNOSIS — J4521 Mild intermittent asthma with (acute) exacerbation: Secondary | ICD-10-CM | POA: Diagnosis not present

## 2019-07-27 MED ORDER — NEBULIZER/TUBING/MOUTHPIECE KIT: 1.0000 | PACK | Freq: Every day | 0 refills | Status: DC

## 2019-07-27 MED ORDER — FLOVENT HFA 44 MCG/ACT IN AERO
2.0000 | INHALATION_SPRAY | Freq: Two times a day (BID) | RESPIRATORY_TRACT | 12 refills | Status: DC
Start: 2019-07-27 — End: 2020-03-22

## 2019-07-27 MED ORDER — AEROCHAMBER PLUS FLO-VU MEDIUM MISC: 1.0000 | Freq: Once | 0 refills | Status: AC

## 2019-07-27 MED ORDER — AZITHROMYCIN 250 MG PO TABS
250.0000 mg | ORAL_TABLET | Freq: Every day | ORAL | 0 refills | Status: DC
Start: 2019-07-27 — End: 2019-10-14

## 2019-07-27 MED ORDER — ALBUTEROL SULFATE (2.5 MG/3ML) 0.083% IN NEBU
2.5000 mg | INHALATION_SOLUTION | Freq: Four times a day (QID) | RESPIRATORY_TRACT | 12 refills | Status: DC | PRN
Start: 2019-07-27 — End: 2020-05-12

## 2019-07-27 NOTE — ED Triage Notes (Signed)
Patient reports asthma exacerbation for a week, symptoms worsened over the weekend.  Having difficulty inhaling nebulizer.  Patient has runny nose and cough
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
## 2019-07-27 NOTE — Discharge Instructions (Addendum)
May use nebulizer or inhaler with spacing device up to every 4 hours as needed for chest tightness, wheezing. Important to do Flovent inhaler twice daily: Stop immediately if you develop any swelling of your lips, tongue, throat. Take Z-Pak as prescribed with food. Important to follow-up with PCP in 1 week. Go to ER for worsening breathing, or you develop chest pain, fever.

## 2019-07-28 ENCOUNTER — Emergency Department (HOSPITAL_COMMUNITY)

## 2019-07-28 ENCOUNTER — Emergency Department (HOSPITAL_COMMUNITY)
Admission: EM | Admit: 2019-07-28 | Discharge: 2019-07-28 | Disposition: A | Discharge status: Home / Self Care | Attending: Emergency Medicine | Admitting: Emergency Medicine

## 2019-07-28 DIAGNOSIS — F419 Anxiety disorder, unspecified: Secondary | ICD-10-CM

## 2019-07-28 DIAGNOSIS — Z20822 Contact with and (suspected) exposure to covid-19: Secondary | ICD-10-CM | POA: Insufficient documentation

## 2019-07-28 DIAGNOSIS — Z79899 Other long term (current) drug therapy: Secondary | ICD-10-CM | POA: Insufficient documentation

## 2019-07-28 DIAGNOSIS — R0602 Shortness of breath: Secondary | ICD-10-CM | POA: Diagnosis not present

## 2019-07-28 DIAGNOSIS — J45909 Unspecified asthma, uncomplicated: Secondary | ICD-10-CM | POA: Insufficient documentation

## 2019-07-28 LAB — I-STAT CHEM 8, ED
BUN: 7 mg/dL (ref 6–20)
Calcium, Ion: 1.16 mmol/L (ref 1.15–1.40)
Chloride: 108 mmol/L (ref 98–111)
Creatinine, Ser: 0.7 mg/dL (ref 0.44–1.00)
Glucose, Bld: 89 mg/dL (ref 70–99)
HCT: 42 % (ref 36.0–46.0)
Hemoglobin: 14.3 g/dL (ref 12.0–15.0)
Potassium: 3.3 mmol/L — ABNORMAL LOW (ref 3.5–5.1)
Sodium: 141 mmol/L (ref 135–145)
TCO2: 23 mmol/L (ref 22–32)

## 2019-07-28 LAB — I-STAT BETA HCG BLOOD, ED (MC, WL, AP ONLY): I-stat hCG, quantitative: <5 m[IU]/mL (ref <5)

## 2019-07-28 LAB — POC SARS CORONAVIRUS 2 AG -  ED: SARS Coronavirus 2 Ag: NEGATIVE

## 2019-07-28 MED ORDER — LORAZEPAM 2 MG/ML IJ SOLN
0.5000 mg | Freq: Once | INTRAMUSCULAR | Status: AC
  Administered 2019-07-28: 0.5 mg via INTRAVENOUS
  Filled 2019-07-28: qty 1

## 2019-07-28 MED ORDER — METHYLPREDNISOLONE SODIUM SUCC 125 MG IJ SOLR
125.0000 mg | Freq: Once | INTRAMUSCULAR | Status: AC
  Administered 2019-07-28: 125 mg via INTRAVENOUS
  Filled 2019-07-28: qty 2

## 2019-07-28 MED ORDER — SODIUM CHLORIDE 0.9 % IV BOLUS (SEPSIS)
1000.0000 mL | Freq: Once | INTRAVENOUS | Status: AC
  Administered 2019-07-28: 1000 mL via INTRAVENOUS

## 2019-07-28 MED ORDER — IPRATROPIUM BROMIDE 0.02 % IN SOLN
1.0000 mg | Freq: Once | RESPIRATORY_TRACT | Status: AC
  Administered 2019-07-28: 1 mg via RESPIRATORY_TRACT

## 2019-07-28 MED ORDER — ALBUTEROL SULFATE (2.5 MG/3ML) 0.083% IN NEBU
5.0000 mg | INHALATION_SOLUTION | Freq: Once | RESPIRATORY_TRACT | Status: AC
  Administered 2019-07-28: 5 mg via RESPIRATORY_TRACT

## 2019-07-28 MED ORDER — ALBUTEROL SULFATE (2.5 MG/3ML) 0.083% IN NEBU
INHALATION_SOLUTION | RESPIRATORY_TRACT | Status: AC
  Filled 2019-07-28: qty 6

## 2019-07-28 MED ORDER — IPRATROPIUM BROMIDE 0.02 % IN SOLN
RESPIRATORY_TRACT | Status: AC
  Filled 2019-07-28: qty 5

## 2019-07-28 NOTE — Discharge Instructions (Addendum)
Please pick up the prescriptions that were prescribed by the providers at urgent care.  Your labs, chest x-ray and Covid test today were reassuring/unremarkable.

## 2019-07-28 NOTE — ED Notes (Signed)
Dr. Elesa Massed notified of negative POC covid test
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
## 2019-07-28 NOTE — ED Triage Notes (Signed)
Pt reports asthma exacerbation. She states she took her neb tx at home with no relief

## 2019-07-29 ENCOUNTER — Other Ambulatory Visit: Payer: Self-pay

## 2019-07-29 ENCOUNTER — Ambulatory Visit
Admission: EM | Admit: 2019-07-29 | Discharge: 2019-07-29 | Disposition: A | Discharge status: Home / Self Care | Attending: Physician Assistant | Admitting: Physician Assistant

## 2019-07-29 ENCOUNTER — Encounter: Payer: Self-pay | Admitting: Emergency Medicine

## 2019-07-29 DIAGNOSIS — R059 Cough, unspecified: Secondary | ICD-10-CM

## 2019-07-29 DIAGNOSIS — J4541 Moderate persistent asthma with (acute) exacerbation: Secondary | ICD-10-CM | POA: Diagnosis not present

## 2019-07-29 MED ORDER — CETIRIZINE HCL 10 MG PO TABS
10.0000 mg | ORAL_TABLET | Freq: Every day | ORAL | 0 refills | Status: DC
Start: 2019-07-29 — End: 2019-12-17

## 2019-07-29 MED ORDER — AFRIN NASAL SPRAY 0.05 % NA SOLN
1.0000 | Freq: Two times a day (BID) | NASAL | 0 refills | Status: DC | PRN
Start: 2019-07-29 — End: 2020-05-12

## 2019-07-29 MED ORDER — METHOCARBAMOL 500 MG PO TABS
500.0000 mg | ORAL_TABLET | Freq: Two times a day (BID) | ORAL | 0 refills | Status: DC | PRN
Start: 2019-07-29 — End: 2019-10-29

## 2019-07-29 MED ORDER — IPRATROPIUM-ALBUTEROL 0.5-2.5 (3) MG/3ML IN SOLN: 3.0000 mL | Freq: Four times a day (QID) | RESPIRATORY_TRACT | 0 refills | Status: DC | PRN

## 2019-07-29 MED ORDER — HYDROCODONE-HOMATROPINE 5-1.5 MG/5ML PO SYRP: 5.0000 mL | ORAL_SOLUTION | Freq: Four times a day (QID) | ORAL | 0 refills | Status: DC | PRN

## 2019-07-29 NOTE — Discharge Instructions (Addendum)
Continue azithromycin as directed.  DuoNeb as needed for cough/shortness of breath.  Afrin sparingly to help with nasal congestion.  Hycodan for cough as needed.  If needing something for chest pain, you can take ibuprofen 800 mg, Robaxin for symptoms. Both robaxin and hycodan can make you drowsy, so please monitor your symptoms. Follow up with PCP if symptoms not improving.
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
## 2019-07-29 NOTE — ED Triage Notes (Signed)
cough started 1 week ago.  Chest soreness.  Patient has a history of asthma.  Patient has been using inhaler and nebulizer with no relief.  Patient has been seen in ED and [facility] for uri symptoms.

## 2019-09-02 ENCOUNTER — Ambulatory Visit (INDEPENDENT_AMBULATORY_CARE_PROVIDER_SITE_OTHER): Admitting: Obstetrics and Gynecology

## 2019-09-02 ENCOUNTER — Encounter: Payer: Self-pay | Admitting: Obstetrics and Gynecology

## 2019-09-02 ENCOUNTER — Telehealth: Payer: Self-pay | Admitting: Obstetrics & Gynecology

## 2019-09-02 ENCOUNTER — Other Ambulatory Visit: Payer: Self-pay

## 2019-09-02 DIAGNOSIS — N809 Endometriosis, unspecified: Secondary | ICD-10-CM

## 2019-09-02 NOTE — Progress Notes (Signed)
Pt came in today with c/o one time episode of scant bleeding.  Pt states that she requested to have a nurse call her back but someone called her and told her that she could in today.  Pt reports that when she woke up this am she had a little bit of vaginal bleeding.  She then went to the bathroom, wiped, and there was no blood on the tissue.  Per chart review, pt has a plan with Dr. Macon Large to f/u around the mid of July to see if the combo of OCP and Nexplanon is effective.  Pt reports that the OCP's is working because her bleeding has slowed down she was just concerned about the bleeding that she had this am.  I advised pt to continue with regimen that her and Dr. Macon Large discussed.  I also advised pt that taking the pill for 2-3 months along with her Nexplanon she should see improvement and that she will be able to discuss the other option with Anyanwu at her appt on 10/14/2019.  I also advised pt that in the meantime please call the office with questions or concerns to speak with a nurse.  Pt verbalized understanding.   Addison Naegeli, RN  09/02/19

## 2019-09-02 NOTE — Progress Notes (Signed)
See nursing note. Pt not seen

## 2019-09-02 NOTE — Telephone Encounter (Signed)
Received a call from the patient stating she needed to make an appointment with Dr Macon Large. When given her next available appointment, the patient requested to see any provider. She stated something was wrong down there, and she needed to be seen sooner. Patient scheduled for 06/23.

## 2019-10-14 ENCOUNTER — Ambulatory Visit (INDEPENDENT_AMBULATORY_CARE_PROVIDER_SITE_OTHER): Admitting: Obstetrics & Gynecology

## 2019-10-14 ENCOUNTER — Encounter: Payer: Self-pay | Admitting: Obstetrics & Gynecology

## 2019-10-14 ENCOUNTER — Other Ambulatory Visit: Payer: Self-pay

## 2019-10-14 VITALS — BP 129/83 | HR 89 | Ht 65.0 in | Wt 147.6 lb

## 2019-10-14 DIAGNOSIS — N809 Endometriosis, unspecified: Secondary | ICD-10-CM | POA: Diagnosis not present

## 2019-10-14 MED ORDER — DROSPIRENONE-ETHINYL ESTRADIOL 3-0.02 MG PO TABS: 1.0000 | ORAL_TABLET | Freq: Every day | ORAL | 11 refills | Status: DC

## 2019-10-14 NOTE — Progress Notes (Signed)
   GYNECOLOGY OFFICE VISIT NOTE  History:   Melanie Cain is a 24 y.o. [phone] here today for follow up after initiation of OCPs for bleeding in the setting of Nexplanon being in place for endometriosis management.  This has helped with her bleeding but she has severe daily headaches which is concerning. She denies any abnormal vaginal discharge, bleeding, pelvic pain or other concerns.    Past Medical History:  Diagnosis Date  . Allergy   . Anemia   . Asthma   . Menometrorrhagia   . MRSA (methicillin resistant staph aureus) culture positive 2012   culture from abscess  . Vaginal delivery 07/20/2013    Past Surgical History:  Procedure Laterality Date  . NO PAST SURGERIES     The following portions of the patient's history were reviewed and updated as appropriate: allergies, current medications, past family history, past medical history, past social history, past surgical history and problem list.   Health Maintenance:  Normal pap in 2019. Done in [place].  Review of Systems:  Pertinent items noted in HPI and remainder of comprehensive ROS otherwise negative.  Physical Exam:  BP 129/83   Pulse 89   Ht 5\' 5"  (1.651 m)   Wt 147 lb 9.6 oz (67 kg)   BMI 24.56 kg/m  CONSTITUTIONAL: Well-developed, well-nourished female in no acute distress.  HEENT:  Normocephalic, atraumatic. External right and left ear normal. No scleral icterus.  NECK: Normal range of motion, supple, no masses noted on observation SKIN: No rash noted. Not diaphoretic. No erythema. No pallor. MUSCULOSKELETAL: Normal range of motion. No edema noted. NEUROLOGIC: Alert and oriented to person, place, and time. Normal muscle tone coordination. No cranial nerve deficit noted. PSYCHIATRIC: Normal mood and affect. Normal behavior. Normal judgment and thought content. CARDIOVASCULAR: Normal heart rate noted RESPIRATORY: Effort and breath sounds normal, no problems with respiration noted ABDOMEN: No masses  noted. No other overt distention noted.   PELVIC: Deferred     Assessment and Plan:    1. Endometriosis related bleeding Will switch OCPs to lower estrogen pill with different generation progestin.  Will see if this will alleviate her headaches but still manage her abnormal bleeding. Will evaluate response. If headaches continue, may consider progestin only pill.  - drospirenone-ethinyl estradiol (YAZ) 3-0.02 MG tablet; Take 1 tablet by mouth daily. Only take active hormonal pills in a continuous fashion  Dispense: 28 tablet; Refill: 11 Routine preventative health maintenance measures emphasized. Please refer to After Visit Summary for other counseling recommendations.   Return in about 1 month (around 11/14/2019) for Followup endometriosis, OCP check.    Total face-to-face time with patient: 15 minutes.  Over 50% of encounter was spent on counseling and coordination of care.   01/14/2020, MD, FACOG Obstetrician & Gynecologist, [facility] for [facility]

## 2019-10-14 NOTE — Patient Instructions (Signed)
Return to clinic for any scheduled appointments or for any gynecologic concerns as needed.   

## 2019-10-29 ENCOUNTER — Encounter: Payer: Self-pay | Admitting: Emergency Medicine

## 2019-10-29 ENCOUNTER — Other Ambulatory Visit: Payer: Self-pay

## 2019-10-29 ENCOUNTER — Ambulatory Visit
Admission: EM | Admit: 2019-10-29 | Discharge: 2019-10-29 | Disposition: A | Discharge status: Home / Self Care | Attending: Emergency Medicine | Admitting: Emergency Medicine

## 2019-10-29 DIAGNOSIS — Z20822 Contact with and (suspected) exposure to covid-19: Secondary | ICD-10-CM

## 2019-10-29 MED ORDER — HYDROCODONE-HOMATROPINE 5-1.5 MG/5ML PO SYRP: 5.0000 mL | ORAL_SOLUTION | Freq: Four times a day (QID) | ORAL | 0 refills | Status: DC | PRN

## 2019-10-29 MED ORDER — OMEPRAZOLE 20 MG PO CPDR
20.0000 mg | DELAYED_RELEASE_CAPSULE | Freq: Every day | ORAL | 0 refills | Status: DC
Start: 2019-10-29 — End: 2020-02-02

## 2019-10-29 NOTE — Discharge Instructions (Addendum)
Your COVID test is pending - it is important to quarantine / isolate at home until your results are back. °If you test positive and would like further evaluation for persistent or worsening symptoms, you may schedule an E-visit or virtual (video) visit throughout the [facility] MyChart app or website. ° °PLEASE NOTE: If you develop severe chest pain or shortness of breath please go to the ER or call 9-1-1 for further evaluation --> DO NOT schedule electronic or virtual visits for this. °Please call our office for further guidance / recommendations as needed. ° °For information about the Covid vaccine, please visit [email or web address] °

## 2019-10-29 NOTE — ED Provider Notes (Signed)
[name]    CSN: [id number] Arrival date & time: 10/29/19  1827      History   Chief Complaint Chief Complaint  Patient presents with  . Asthma    HPI Melanie Cain is a 24 y.o. female   Presenting for persistent URI symptoms.  Endorsing rhinorrhea, cough, headache.  Does have intermittent chest tightness which is alleviated by butyryl.  States Hycodan helps: Requesting refill.  Requesting Covid testing.  No chest pain, shortness of breath, vomiting, fevers.  Past Medical History:  Diagnosis Date  . Allergy   . Anemia   . Asthma   . Menometrorrhagia   . MRSA (methicillin resistant staph aureus) culture positive 2012   culture from abscess  . Vaginal delivery 07/20/2013    Patient Active Problem List   Diagnosis Date Noted  . Endometriosis 09/02/2019  . Positive GBS test 11/23/2015  . Vitamin D deficiency 11/23/2015  . Asthma 11/23/2015  . Drug allergy--prednisone 11/23/2015    Past Surgical History:  Procedure Laterality Date  . NO PAST SURGERIES      OB History    Gravida  2   Para  2   Term  2   Preterm  0   AB  0   Living  2     SAB  0   TAB  0   Ectopic  0   Multiple  0   Live Births  2            Home Medications    Prior to Admission medications   Medication Sig Start Date End Date Taking? Authorizing Provider  albuterol (PROVENTIL) (2.5 MG/3ML) 0.083% nebulizer solution Take 3 mLs (2.5 mg total) by nebulization every 6 (six) hours as needed for wheezing or shortness of breath. 07/27/19  Yes Hall-Potvin, [place], PA-C  azelastine (ASTELIN) 0.1 % nasal spray Place 2 sprays into both nostrils 2 (two) times daily. 05/29/19  Yes Yu, Amy V, PA-C  cetirizine (ZYRTEC ALLERGY) 10 MG tablet Take 1 tablet (10 mg total) by mouth daily. 07/29/19  Yes Yu, Amy V, PA-C  drospirenone-ethinyl estradiol (YAZ) 3-0.02 MG tablet Take 1 tablet by mouth daily. Only take active hormonal pills in a continuous fashion 10/14/19  Yes  Anyanwu, Ugonna A, MD  ipratropium-albuterol (DUONEB) 0.5-2.5 (3) MG/3ML SOLN Take 3 mLs by nebulization every 6 (six) hours as needed. 07/29/19  Yes Yu, Amy V, PA-C  oxymetazoline (AFRIN NASAL SPRAY) 0.05 % nasal spray Place 1 spray into both nostrils 2 (two) times daily as needed for congestion. Do not use more than 3 days in a row 07/29/19  Yes Yu, Amy V, PA-C  acetaminophen (TYLENOL) 500 MG tablet Take 500 mg by mouth every 6 (six) hours as needed for fever.     [provider]  fluticasone (FLOVENT HFA) 44 MCG/ACT inhaler Inhale 2 puffs into the lungs in the morning and at bedtime. 07/27/19   Hall-Potvin, [place], PA-C  HYDROcodone-homatropine (HYCODAN) 5-1.5 MG/5ML syrup Take 5 mLs by mouth every 6 (six) hours as needed for cough. 10/29/19   Hall-Potvin, [place], PA-C  omeprazole (PRILOSEC) 20 MG capsule Take 1 capsule (20 mg total) by mouth daily. 10/29/19   Hall-Potvin, [place], PA-C  Respiratory Therapy Supplies (NEBULIZER/TUBING/MOUTHPIECE) KIT 1 kit by Does not apply route daily. 07/27/19   Hall-Potvin, [place], PA-C  famotidine (PEPCID) 20 MG tablet Take 1 tablet (20 mg total) by mouth 2 (two) times daily. 05/29/19 10/29/19  Tasia Catchings, Amy V, PA-C  fluticasone (  FLONASE) 50 MCG/ACT nasal spray Place 2 sprays into both nostrils daily. 10/15/15 05/18/19  Jorje Guild, NP  Fluticasone-Salmeterol (ADVAIR DISKUS) 500-50 MCG/DOSE AEPB Inhale 1 puff into the lungs 2 (two) times a day. 07/31/18 05/18/19  McDonald, Laymond Purser, PA-C    Family History Family History  Problem Relation Age of Onset  . Asthma Other   . Cancer Other   . Asthma Mother   . Hypertension Mother   . Asthma Father   . Asthma Sister        2 sisters  . Cancer Maternal Grandmother        breast cancer  . Hearing loss Maternal Grandmother     Social History Social History   Tobacco Use  . Smoking status: Never Smoker  . Smokeless tobacco: Never Used  Substance Use Topics  . Alcohol use: No  . Drug use: No      Allergies   Montelukast sodium, Prednisone, and Tessalon [benzonatate]   Review of Systems As per HPI   Physical Exam Triage Vital Signs ED Triage Vitals  Enc Vitals Group     BP 10/29/19 1935 125/80     Pulse Rate 10/29/19 1935 73     Resp 10/29/19 1935 18     Temp 10/29/19 1935 98.6 F (37 C)     Temp Source 10/29/19 1935 Oral     SpO2 10/29/19 1935 98 %     Weight --      Height --      Head Circumference --      Peak Flow --      Pain Score 10/29/19 1931 8     Pain Loc --      Pain Edu? --      Excl. in [place]? --    No data found.  Updated Vital Signs BP 125/80 (BP Location: Right Arm)   Pulse 73   Temp 98.6 F (37 C) (Oral)   Resp 18   SpO2 98%   Visual Acuity Right Eye Distance:   Left Eye Distance:   Bilateral Distance:    Right Eye Near:   Left Eye Near:    Bilateral Near:     Physical Exam Constitutional:      General: She is not in acute distress. HENT:     Head: Normocephalic and atraumatic.  Eyes:     General: No scleral icterus.    Pupils: Pupils are equal, round, and reactive to light.  Cardiovascular:     Rate and Rhythm: Normal rate.  Pulmonary:     Effort: Pulmonary effort is normal.  Skin:    Coloration: Skin is not jaundiced or pale.  Neurological:     Mental Status: She is alert and oriented to person, place, and time.      UC Treatments / Results  Labs (all labs ordered are listed, but only abnormal results are displayed) Labs Reviewed  NOVEL CORONAVIRUS, NAA    EKG   Radiology No results found.  Procedures Procedures (including critical care time)  Medications Ordered in UC Medications - No data to display  Initial Impression / Assessment and Plan / UC Course  I have reviewed the triage vital signs and the nursing notes.  Pertinent labs & imaging results that were available during my care of the patient were reviewed by me and considered in my medical decision making (see chart for details).      Patient afebrile, nontoxic, with SpO2 98%.  Covid PCR pending.  Patient  to quarantine until results are back.  We will treat supportively as outlined below.  Return precautions discussed, patient verbalized understanding and is agreeable to plan. Final Clinical Impressions(s) / UC Diagnoses   Final diagnoses:  Encounter for laboratory testing for COVID-19 virus     Discharge Instructions     Your COVID test is pending - it is important to quarantine / isolate at home until your results are back. If you test positive and would like further evaluation for persistent or worsening symptoms, you may schedule an E-visit or virtual (video) visit throughout the [facility] app or website.  PLEASE NOTE: If you develop severe chest pain or shortness of breath please go to the ER or call 9-1-1 for further evaluation --> DO NOT schedule electronic or virtual visits for this. Please call our office for further guidance / recommendations as needed.  For information about the Covid vaccine, please visit [email or web address]    ED Prescriptions    Medication Sig Dispense Auth. Provider   omeprazole (PRILOSEC) 20 MG capsule Take 1 capsule (20 mg total) by mouth daily. 30 capsule Hall-Potvin, [place], PA-C   HYDROcodone-homatropine (HYCODAN) 5-1.5 MG/5ML syrup Take 5 mLs by mouth every 6 (six) hours as needed for cough. 60 mL Hall-Potvin, [place], PA-C     I have reviewed the PDMP during this encounter.   Hall-Potvin, [place], [place] 10/29/19 2035

## 2019-10-29 NOTE — ED Triage Notes (Signed)
Runny nose, chest tightening, cough, headache.  Symptoms started on monday

## 2019-10-31 LAB — SARS-COV-2, NAA 2 DAY TAT

## 2019-10-31 LAB — NOVEL CORONAVIRUS, NAA: SARS-CoV-2, NAA: NOT DETECTED

## 2019-11-06 ENCOUNTER — Other Ambulatory Visit: Payer: Self-pay

## 2019-11-06 ENCOUNTER — Ambulatory Visit
Admission: EM | Admit: 2019-11-06 | Discharge: 2019-11-06 | Disposition: A | Discharge status: Home / Self Care | Attending: Family Medicine | Admitting: Family Medicine

## 2019-11-06 DIAGNOSIS — J019 Acute sinusitis, unspecified: Secondary | ICD-10-CM

## 2019-11-06 DIAGNOSIS — Z20822 Contact with and (suspected) exposure to covid-19: Secondary | ICD-10-CM

## 2019-11-06 DIAGNOSIS — Z1152 Encounter for screening for COVID-19: Secondary | ICD-10-CM

## 2019-11-06 DIAGNOSIS — G44019 Episodic cluster headache, not intractable: Secondary | ICD-10-CM | POA: Diagnosis not present

## 2019-11-06 DIAGNOSIS — R0981 Nasal congestion: Secondary | ICD-10-CM

## 2019-11-06 MED ORDER — BUTALBITAL-APAP-CAFFEINE 50-325-40 MG PO TABS
1.0000 | ORAL_TABLET | Freq: Four times a day (QID) | ORAL | 0 refills | Status: DC | PRN
Start: 2019-11-06 — End: 2020-02-02

## 2019-11-06 MED ORDER — AMOXICILLIN-POT CLAVULANATE 875-125 MG PO TABS
1.0000 | ORAL_TABLET | Freq: Two times a day (BID) | ORAL | 0 refills | Status: DC
Start: 2019-11-06 — End: 2020-02-02

## 2019-11-06 MED ORDER — KETOROLAC TROMETHAMINE 60 MG/2ML IM SOLN
60.0000 mg | Freq: Once | INTRAMUSCULAR | Status: AC
  Administered 2019-11-06: 60 mg via INTRAMUSCULAR

## 2019-11-06 MED ORDER — DEXAMETHASONE SODIUM PHOSPHATE 10 MG/ML IJ SOLN
5.0000 mg | Freq: Once | INTRAMUSCULAR | Status: AC
  Administered 2019-11-06: 5 mg via INTRAMUSCULAR

## 2019-11-06 MED ORDER — DEXAMETHASONE SODIUM PHOSPHATE 10 MG/ML IJ SOLN: 5.0000 mg | Freq: Once | INTRAMUSCULAR | Status: DC

## 2019-11-06 NOTE — ED Provider Notes (Signed)
[name]    CSN: [id number] Arrival date & time: 11/06/19  1311      History   Chief Complaint No chief complaint on file.  HPI Melanie Cain is a 24 y.o. female.   HPI Patient presents with a severe HA x 3 days and severe congestion. COVID-19 test on 10/29/19 negative. No fever. Head pain present behind and eyes and characterizes pain as intense pressure. She has taken ibuprofen and tylenol daily for symptoms without improvement of HA pain.   Past Medical History:  Diagnosis Date  . Allergy   . Anemia   . Asthma   . Menometrorrhagia   . MRSA (methicillin resistant staph aureus) culture positive 2012   culture from abscess  . Vaginal delivery 07/20/2013    Patient Active Problem List   Diagnosis Date Noted  . Endometriosis 09/02/2019  . Positive GBS test 11/23/2015  . Vitamin D deficiency 11/23/2015  . Asthma 11/23/2015  . Drug allergy--prednisone 11/23/2015    Past Surgical History:  Procedure Laterality Date  . NO PAST SURGERIES      OB History    Gravida  2   Para  2   Term  2   Preterm  0   AB  0   Living  2     SAB  0   TAB  0   Ectopic  0   Multiple  0   Live Births  2            Home Medications    Prior to Admission medications   Medication Sig Start Date End Date Taking? Authorizing Provider  cetirizine (ZYRTEC ALLERGY) 10 MG tablet Take 1 tablet (10 mg total) by mouth daily. 07/29/19  Yes Yu, Amy V, PA-C  drospirenone-ethinyl estradiol (YAZ) 3-0.02 MG tablet Take 1 tablet by mouth daily. Only take active hormonal pills in a continuous fashion 10/14/19  Yes Anyanwu, Sallyanne Havers, MD  HYDROcodone-homatropine (HYCODAN) 5-1.5 MG/5ML syrup Take 5 mLs by mouth every 6 (six) hours as needed for cough. 10/29/19  Yes Hall-Potvin, [place], PA-C  omeprazole (PRILOSEC) 20 MG capsule Take 1 capsule (20 mg total) by mouth daily. 10/29/19  Yes Hall-Potvin, [place], PA-C  oxymetazoline (AFRIN NASAL Cain) 0.05 % nasal Cain  Place 1 Cain into both nostrils 2 (two) times daily as needed for congestion. Do not use more than 3 days in a row 07/29/19  Yes Yu, Amy V, PA-C  acetaminophen (TYLENOL) 500 MG tablet Take 500 mg by mouth every 6 (six) hours as needed for fever.     [provider]  albuterol (PROVENTIL) (2.5 MG/3ML) 0.083% nebulizer solution Take 3 mLs (2.5 mg total) by nebulization every 6 (six) hours as needed for wheezing or shortness of breath. 07/27/19   Hall-Potvin, [place], PA-C  amoxicillin-clavulanate (AUGMENTIN) 875-125 MG tablet Take 1 tablet by mouth 2 (two) times daily. 11/06/19   Scot Jun, FNP  azelastine (ASTELIN) 0.1 % nasal Cain Place 2 sprays into both nostrils 2 (two) times daily. 05/29/19   Ok Edwards, PA-C  butalbital-acetaminophen-caffeine (FIORICET) [phone] MG tablet Take 1 tablet by mouth every 6 (six) hours as needed for headache or migraine. 11/06/19 11/05/20  Scot Jun, FNP  fluticasone (FLOVENT HFA) 44 MCG/ACT inhaler Inhale 2 puffs into the lungs in the morning and at bedtime. 07/27/19   Hall-Potvin, [place], PA-C  ipratropium-albuterol (DUONEB) 0.5-2.5 (3) MG/3ML SOLN Take 3 mLs by nebulization every 6 (six) hours as needed. 07/29/19  Ok Edwards, PA-C  Respiratory Therapy Supplies (NEBULIZER/TUBING/MOUTHPIECE) KIT 1 kit by Does not apply route daily. 07/27/19   Hall-Potvin, [place], PA-C  famotidine (PEPCID) 20 MG tablet Take 1 tablet (20 mg total) by mouth 2 (two) times daily. 05/29/19 10/29/19  Tasia Catchings, Amy V, PA-C  fluticasone (FLONASE) 50 MCG/ACT nasal Cain Place 2 sprays into both nostrils daily. 10/15/15 05/18/19  Jorje Guild, NP  Fluticasone-Salmeterol (ADVAIR DISKUS) 500-50 MCG/DOSE AEPB Inhale 1 puff into the lungs 2 (two) times a day. 07/31/18 05/18/19  McDonald, Laymond Purser, PA-C    Family History Family History  Problem Relation Age of Onset  . Asthma Other   . Cancer Other   . Asthma Mother   . Hypertension Mother   . Asthma Father   . Asthma Sister        2  sisters  . Cancer Maternal Grandmother        breast cancer  . Hearing loss Maternal Grandmother     Social History Social History   Tobacco Use  . Smoking status: Never Smoker  . Smokeless tobacco: Never Used  Substance Use Topics  . Alcohol use: No  . Drug use: No     Allergies   Montelukast sodium, Prednisone, and Tessalon [benzonatate]   Review of Systems Review of Systems Pertinent negatives listed in HPI Physical Exam Triage Vital Signs ED Triage Vitals  Enc Vitals Group     BP 11/06/19 1335 111/73     Pulse Rate 11/06/19 1335 79     Resp 11/06/19 1335 18     Temp 11/06/19 1335 98.3 F (36.8 C)     Temp src --      SpO2 11/06/19 1335 98 %     Weight --      Height --      Head Circumference --      Peak Flow --      Pain Score 11/06/19 1333 10     Pain Loc --      Pain Edu? --      Excl. in [place]? --    No data found.  Updated Vital Signs BP 111/73   Pulse 79   Temp 98.3 F (36.8 C)   Resp 18   SpO2 98%   Visual Acuity Right Eye Distance:   Left Eye Distance:   Bilateral Distance:    Right Eye Near:   Left Eye Near:    Bilateral Near:     Physical Exam General Appearance:    Alert, cooperative, no distress  HENT:  Bilateral TM normal without fluid or infection, neck without nodes, frontal and maxiallary sinus tender and nasal mucosa edematous congested  Eyes:    PERRL, conjunctiva/corneas clear, EOM's intact       Lungs:     Clear to auscultation bilaterally, respirations unlabored  Heart:    Regular rate and rhythm  Neurologic:   Awake, alert, oriented x 3. No apparent focal neurological           defect.         UC Treatments / Results  Labs (all labs ordered are listed, but only abnormal results are displayed) Labs Reviewed - No data to display  EKG   Radiology No results found.  Procedures Procedures (including critical care time)  Medications Ordered in UC Medications  ketorolac (TORADOL) injection 60 mg (has no  administration in time range)  dexamethasone (DECADRON) injection 5 mg (has no administration in time range)  Initial Impression / Assessment and Plan / UC Course  I have reviewed the triage vital signs and the nursing notes.  Pertinent labs & imaging results that were available during my care of the patient were reviewed by me and considered in my medical decision making (see chart for details).    HA cocktail administered in clinic today- Toradol 30 mg IM and Decadron 5 mg IM. Fioricet PRN for HA. Augmentin BID x 10 days for acute sinusitis. Retested given for COVID given worsening of symptoms. Recommended quarantine until results of COVID test are known.  Final Clinical Impressions(s) / UC Diagnoses   Final diagnoses:  Episodic cluster headache, not intractable  Head congestion  Suspected COVID-19 virus infection  Acute bacterial sinusitis     Discharge Instructions     Your COVID 19 results will be available in 48 hours. Negative results are immediately resulted to Mychart. Positive results will receive a follow-up call from our clinic. If symptoms are present, I recommend home quarantine until results are known. Positive COVID-19 infections require 10 days quarantine from the date of positive test. You have been prescribed treatment for your diagnosis today.       ED Prescriptions    Medication Sig Dispense Auth. Provider   amoxicillin-clavulanate (AUGMENTIN) 875-125 MG tablet Take 1 tablet by mouth 2 (two) times daily. 20 tablet Scot Jun, FNP   butalbital-acetaminophen-caffeine (FIORICET) 50-325-40 MG tablet Take 1 tablet by mouth every 6 (six) hours as needed for headache or migraine. 15 tablet Scot Jun, FNP     PDMP not reviewed this encounter.   Scot Jun, FNP 11/12/19 2357

## 2019-11-06 NOTE — Discharge Instructions (Signed)
Your COVID 19 results will be available in 48 hours. Negative results are immediately resulted to Mychart. Positive results will receive a follow-up call from our clinic. If symptoms are present, I recommend home quarantine until results are known. Positive COVID-19 infections require 10 days quarantine from the date of positive test. You have been prescribed treatment for your diagnosis today.

## 2019-11-06 NOTE — ED Triage Notes (Signed)
Pt presents with fontal headache x 3 days. Reports she feels pressure in her nose up into her head. Concerned for sinus headache. Reports she feels like she needs to blow her nose and cant. Pt denies any fevers. Pt reports a negative covid test with no new exposure.

## 2019-11-08 LAB — NOVEL CORONAVIRUS, NAA: SARS-CoV-2, NAA: NOT DETECTED

## 2019-11-08 LAB — SARS-COV-2, NAA 2 DAY TAT

## 2019-11-11 ENCOUNTER — Ambulatory Visit (INDEPENDENT_AMBULATORY_CARE_PROVIDER_SITE_OTHER): Admitting: Obstetrics & Gynecology

## 2019-11-11 ENCOUNTER — Other Ambulatory Visit: Payer: Self-pay

## 2019-11-11 ENCOUNTER — Encounter: Payer: Self-pay | Admitting: Obstetrics & Gynecology

## 2019-11-11 VITALS — BP 119/80 | HR 79 | Ht 66.0 in | Wt 152.0 lb

## 2019-11-11 DIAGNOSIS — N809 Endometriosis, unspecified: Secondary | ICD-10-CM | POA: Diagnosis not present

## 2019-11-11 MED ORDER — DROSPIRENONE-ETHINYL ESTRADIOL 3-0.02 MG PO TABS: 1.0000 | ORAL_TABLET | Freq: Every day | ORAL | 5 refills | Status: DC

## 2019-11-11 NOTE — Patient Instructions (Signed)
Return to clinic for any scheduled appointments or for any gynecologic concerns as needed.   

## 2019-11-11 NOTE — Progress Notes (Signed)
   GYNECOLOGY OFFICE VISIT NOTE  History:   Melanie Cain is a 24 y.o. [phone] here today for follow up after change of OCPs for bleeding in the setting of Nexplanon being in place for endometriosis management.   Was started on Yaz, she tolerated this well. Decreased headaches, no other symptoms.  She denies any abnormal vaginal discharge, bleeding, pelvic pain or other concerns.    Past Medical History:  Diagnosis Date  . Allergy   . Anemia   . Asthma   . Menometrorrhagia   . MRSA (methicillin resistant staph aureus) culture positive 2012   culture from abscess  . Vaginal delivery 07/20/2013    Past Surgical History:  Procedure Laterality Date  . NO PAST SURGERIES      The following portions of the patient's history were reviewed and updated as appropriate: allergies, current medications, past family history, past medical history, past social history, past surgical history and problem list.   Health Maintenance:  Normal pap in 2019. Done in [place]   Review of Systems:  Pertinent items noted in HPI and remainder of comprehensive ROS otherwise negative.  Physical Exam:  BP 119/80   Pulse 79   Ht 5\' 6"  (1.676 m)   Wt 152 lb (68.9 kg)   LMP 10/31/2019 (Exact Date)   BMI 24.53 kg/m  CONSTITUTIONAL: Well-developed, well-nourished female in no acute distress.  HEENT:  Normocephalic, atraumatic. External right and left ear normal. No scleral icterus.  NECK: Normal range of motion, supple, no masses noted on observation SKIN: No rash noted. Not diaphoretic. No erythema. No pallor. MUSCULOSKELETAL: Normal range of motion. No edema noted. NEUROLOGIC: Alert and oriented to person, place, and time. Normal muscle tone coordination. No cranial nerve deficit noted. PSYCHIATRIC: Normal mood and affect. Normal behavior. Normal judgment and thought content. CARDIOVASCULAR: Normal heart rate noted RESPIRATORY: Effort and breath sounds normal, no problems with respiration  noted ABDOMEN: No masses noted. No other overt distention noted.   PELVIC: Deferred     Assessment and Plan:    1. Endometriosis Continue Yaz for now as it is helping her symptoms (pain and irregular bleeding) with minimal side effects.  She was told to call if she has any further concerns. - drospirenone-ethinyl estradiol (YAZ) 3-0.02 MG tablet; Take 1 tablet by mouth daily. Only take active hormonal pills in a continuous fashion  Dispense: 84 tablet; Refill: 5 Routine preventative health maintenance measures emphasized. Please refer to After Visit Summary for other counseling recommendations.   Return for any gynecologic concerns.    Total face-to-face time with patient: 10 minutes.  Over 50% of encounter was spent on counseling and coordination of care.   03-29-1996, MD, FACOG Obstetrician & Gynecologist, [facility] for [facility]

## 2019-12-17 ENCOUNTER — Other Ambulatory Visit: Payer: Self-pay

## 2019-12-17 ENCOUNTER — Ambulatory Visit
Admission: EM | Admit: 2019-12-17 | Discharge: 2019-12-17 | Disposition: A | Discharge status: Home / Self Care | Attending: Emergency Medicine | Admitting: Emergency Medicine

## 2019-12-17 DIAGNOSIS — R21 Rash and other nonspecific skin eruption: Secondary | ICD-10-CM

## 2019-12-17 MED ORDER — TRIAMCINOLONE ACETONIDE 0.1 % EX CREA: 1.0000 "application " | TOPICAL_CREAM | Freq: Two times a day (BID) | CUTANEOUS | 0 refills | Status: DC

## 2019-12-17 MED ORDER — CETIRIZINE HCL 10 MG PO CAPS
10.0000 mg | ORAL_CAPSULE | Freq: Every day | ORAL | 0 refills | Status: DC
Start: 2019-12-17 — End: 2020-03-22

## 2019-12-17 MED ORDER — HYDROXYZINE HCL 25 MG PO TABS
25.0000 mg | ORAL_TABLET | Freq: Four times a day (QID) | ORAL | 0 refills | Status: DC
Start: 2019-12-17 — End: 2020-02-02

## 2019-12-17 MED ORDER — METHYLPREDNISOLONE SODIUM SUCC 125 MG IJ SOLR
125.0000 mg | Freq: Once | INTRAMUSCULAR | Status: AC
  Administered 2019-12-17: 125 mg via INTRAMUSCULAR

## 2019-12-17 NOTE — ED Provider Notes (Addendum)
[name]    CSN: [id number] Arrival date & time: 12/17/19  1631      History   Chief Complaint Chief Complaint  Patient presents with  . Rash    x 1 week    HPI Melanie Cain is a 24 y.o. female presenting today for evaluation of rash.  Patient reports over the past week she has developed bumps to her extremities as well as trunk.  Rash is associated with itching.  Denies close contacts with similar symptoms.  Feels different from when she has had scabies in the past.  She denies any new foods soaps lotions detergents, did recently use a new deodorant.  Denies new foods or medicines.  Denies history of similar.  Denies fevers nausea or vomiting.  HPI  Past Medical History:  Diagnosis Date  . Allergy   . Anemia   . Asthma   . Menometrorrhagia   . MRSA (methicillin resistant staph aureus) culture positive 2012   culture from abscess  . Vaginal delivery 07/20/2013    Patient Active Problem List   Diagnosis Date Noted  . Endometriosis 09/02/2019  . Positive GBS test 11/23/2015  . Vitamin D deficiency 11/23/2015  . Asthma 11/23/2015  . Drug allergy--prednisone 11/23/2015    Past Surgical History:  Procedure Laterality Date  . NO PAST SURGERIES      OB History    Gravida  2   Para  2   Term  2   Preterm  0   AB  0   Living  2     SAB  0   TAB  0   Ectopic  0   Multiple  0   Live Births  2            Home Medications    Prior to Admission medications   Medication Sig Start Date End Date Taking? Authorizing Provider  acetaminophen (TYLENOL) 500 MG tablet Take 500 mg by mouth every 6 (six) hours as needed for fever.     [provider]  albuterol (PROVENTIL) (2.5 MG/3ML) 0.083% nebulizer solution Take 3 mLs (2.5 mg total) by nebulization every 6 (six) hours as needed for wheezing or shortness of breath. 07/27/19   Hall-Potvin, [place], PA-C  amoxicillin-clavulanate (AUGMENTIN) 875-125 MG tablet Take 1 tablet by  mouth 2 (two) times daily. Patient not taking: Reported on 11/11/2019 11/06/19   Scot Jun, FNP  azelastine (ASTELIN) 0.1 % nasal spray Place 2 sprays into both nostrils 2 (two) times daily. 05/29/19   Ok Edwards, PA-C  butalbital-acetaminophen-caffeine (FIORICET) [phone] MG tablet Take 1 tablet by mouth every 6 (six) hours as needed for headache or migraine. 11/06/19 11/05/20  Scot Jun, FNP  Cetirizine HCl 10 MG CAPS Take 1 capsule (10 mg total) by mouth daily for 10 days. 12/17/19 12/27/19  Latiya Navia C, PA-C  drospirenone-ethinyl estradiol (YAZ) 3-0.02 MG tablet Take 1 tablet by mouth daily. Only take active hormonal pills in a continuous fashion 11/11/19   Anyanwu, Sallyanne Havers, MD  fluticasone (FLOVENT HFA) 44 MCG/ACT inhaler Inhale 2 puffs into the lungs in the morning and at bedtime. 07/27/19   Hall-Potvin, [place], PA-C  HYDROcodone-homatropine (HYCODAN) 5-1.5 MG/5ML syrup Take 5 mLs by mouth every 6 (six) hours as needed for cough. 10/29/19   Hall-Potvin, [place], PA-C  hydrOXYzine (ATARAX/VISTARIL) 25 MG tablet Take 1 tablet (25 mg total) by mouth every 6 (six) hours. 12/17/19   Aleta Manternach C, PA-C  ipratropium-albuterol (DUONEB)  0.5-2.5 (3) MG/3ML SOLN Take 3 mLs by nebulization every 6 (six) hours as needed. 07/29/19   Tasia Catchings, Amy V, PA-C  omeprazole (PRILOSEC) 20 MG capsule Take 1 capsule (20 mg total) by mouth daily. 10/29/19   Hall-Potvin, [place], PA-C  oxymetazoline (AFRIN NASAL SPRAY) 0.05 % nasal spray Place 1 spray into both nostrils 2 (two) times daily as needed for congestion. Do not use more than 3 days in a row 07/29/19   Tasia Catchings, Amy V, PA-C  Respiratory Therapy Supplies (NEBULIZER/TUBING/MOUTHPIECE) KIT 1 kit by Does not apply route daily. 07/27/19   Hall-Potvin, [place], PA-C  triamcinolone cream (KENALOG) 0.1 % Apply 1 application topically 2 (two) times daily. 12/17/19   Keanu Frickey C, PA-C  famotidine (PEPCID) 20 MG tablet Take 1 tablet (20 mg total) by mouth 2  (two) times daily. 05/29/19 10/29/19  Tasia Catchings, Amy V, PA-C  fluticasone (FLONASE) 50 MCG/ACT nasal spray Place 2 sprays into both nostrils daily. 10/15/15 05/18/19  Jorje Guild, NP  Fluticasone-Salmeterol (ADVAIR DISKUS) 500-50 MCG/DOSE AEPB Inhale 1 puff into the lungs 2 (two) times a day. 07/31/18 05/18/19  McDonald, Laymond Purser, PA-C    Family History Family History  Problem Relation Age of Onset  . Asthma Other   . Cancer Other   . Asthma Mother   . Hypertension Mother   . Asthma Father   . Asthma Sister        2 sisters  . Cancer Maternal Grandmother        breast cancer  . Hearing loss Maternal Grandmother     Social History Social History   Tobacco Use  . Smoking status: Never Smoker  . Smokeless tobacco: Never Used  Vaping Use  . Vaping Use: Never used  Substance Use Topics  . Alcohol use: No  . Drug use: No     Allergies   Montelukast sodium, Prednisone, and Tessalon [benzonatate]   Review of Systems Review of Systems  Constitutional: Negative for fatigue and fever.  HENT: Negative for mouth sores.   Eyes: Negative for visual disturbance.  Respiratory: Negative for shortness of breath.   Cardiovascular: Negative for chest pain.  Gastrointestinal: Negative for abdominal pain, nausea and vomiting.  Genitourinary: Negative for genital sores.  Musculoskeletal: Negative for arthralgias and joint swelling.  Skin: Positive for rash. Negative for color change and wound.  Neurological: Negative for dizziness, weakness, light-headedness and headaches.     Physical Exam Triage Vital Signs ED Triage Vitals  Enc Vitals Group     BP      Pulse      Resp      Temp      Temp src      SpO2      Weight      Height      Head Circumference      Peak Flow      Pain Score      Pain Loc      Pain Edu?      Excl. in [place]?    No data found.  Updated Vital Signs BP 111/70 (BP Location: Left Arm)   Pulse 81   Temp 98.3 F (36.8 C) (Oral)   Resp 17   LMP 11/30/2019 (Exact  Date)   SpO2 99%   Visual Acuity Right Eye Distance:   Left Eye Distance:   Bilateral Distance:    Right Eye Near:   Left Eye Near:    Bilateral Near:     Physical Exam Vitals  and nursing note reviewed.  Constitutional:      Appearance: She is well-developed.     Comments: No acute distress  HENT:     Head: Normocephalic and atraumatic.     Nose: Nose normal.  Eyes:     Conjunctiva/sclera: Conjunctivae normal.  Cardiovascular:     Rate and Rhythm: Normal rate.  Pulmonary:     Effort: Pulmonary effort is normal. No respiratory distress.  Abdominal:     General: There is no distension.  Musculoskeletal:        General: Normal range of motion.     Cervical back: Neck supple.  Skin:    General: Skin is warm and dry.     Comments: Mild papular rash noted to extremities trunk, skin colored, occasionally with scabbing, no erythema, no clustering to back, no vesicular lesions  Neurological:     Mental Status: She is alert and oriented to person, place, and time.      UC Treatments / Results  Labs (all labs ordered are listed, but only abnormal results are displayed) Labs Reviewed - No data to display  EKG   Radiology No results found.  Procedures Procedures (including critical care time)  Medications Ordered in UC Medications  methylPREDNISolone sodium succinate (SOLU-MEDROL) 125 mg/2 mL injection 125 mg (125 mg Intramuscular Given 12/17/19 1744)    Initial Impression / Assessment and Plan / UC Course  I have reviewed the triage vital signs and the nursing notes.  Pertinent labs & imaging results that were available during my care of the patient were reviewed by me and considered in my medical decision making (see chart for details).     Unclear cause of rash at this time, does not appear allergic or contact dermatitis, does not appear infectious of bacterial/fungal, seems less likely bites, but opting to treat with one-time IM Solu-Medrol here followed by  continued use of antihistamines and topical steroid cream. Possible viral etiology.  Discussed strict return precautions. Patient verbalized understanding and is agreeable with plan.  Final Clinical Impressions(s) / UC Diagnoses   Final diagnoses:  Rash and nonspecific skin eruption     Discharge Instructions     We gave you an injection of Solu-Medrol to help with itching Continue with hydroxyzine as needed for itching at home Daily cetirizine in the morning Triamcinolone twice daily to areas of significant itching Follow-up if rash not resolving or worsening    ED Prescriptions    Medication Sig Dispense Auth. Provider   hydrOXYzine (ATARAX/VISTARIL) 25 MG tablet Take 1 tablet (25 mg total) by mouth every 6 (six) hours. 20 tablet Laurali Goddard C, PA-C   Cetirizine HCl 10 MG CAPS Take 1 capsule (10 mg total) by mouth daily for 10 days. 10 capsule Nathaniel Yaden C, PA-C   triamcinolone cream (KENALOG) 0.1 % Apply 1 application topically 2 (two) times daily. 453.6 g Janith Lima, PA-C     PDMP not reviewed this encounter.   Joneen Caraway Braxton C, PA-C 12/17/19 1747    Janith Lima, PA-C 12/17/19 1748

## 2019-12-17 NOTE — Discharge Instructions (Signed)
We gave you an injection of Solu-Medrol to help with itching Continue with hydroxyzine as needed for itching at home Daily cetirizine in the morning Triamcinolone twice daily to areas of significant itching Follow-up if rash not resolving or worsening

## 2019-12-17 NOTE — ED Triage Notes (Signed)
Pt states that she has generalized rash and bumps on her entire body. Pt pointed at some but was unable to visualize them. Pt states they itch badly. This has occurred for one week. Pt is aox4 and ambulatory.

## 2020-02-02 ENCOUNTER — Encounter: Payer: Self-pay | Admitting: Certified Nurse Midwife

## 2020-02-02 ENCOUNTER — Other Ambulatory Visit: Payer: Self-pay

## 2020-02-02 ENCOUNTER — Other Ambulatory Visit (HOSPITAL_COMMUNITY)
Admission: RE | Admit: 2020-02-02 | Discharge: 2020-02-02 | Disposition: A | Discharge status: Home / Self Care | Source: Ambulatory Visit | Attending: Certified Nurse Midwife | Admitting: Certified Nurse Midwife

## 2020-02-02 ENCOUNTER — Ambulatory Visit (INDEPENDENT_AMBULATORY_CARE_PROVIDER_SITE_OTHER): Admitting: Certified Nurse Midwife

## 2020-02-02 VITALS — BP 118/81 | HR 61 | Wt 146.5 lb

## 2020-02-02 DIAGNOSIS — B3731 Acute candidiasis of vulva and vagina: Secondary | ICD-10-CM

## 2020-02-02 DIAGNOSIS — Z113 Encounter for screening for infections with a predominantly sexual mode of transmission: Secondary | ICD-10-CM | POA: Insufficient documentation

## 2020-02-02 DIAGNOSIS — N760 Acute vaginitis: Secondary | ICD-10-CM

## 2020-02-02 DIAGNOSIS — B373 Candidiasis of vulva and vagina: Secondary | ICD-10-CM

## 2020-02-02 MED ORDER — TERCONAZOLE 0.4 % VA CREA: 1.0000 | TOPICAL_CREAM | Freq: Every day | VAGINAL | 0 refills | Status: DC

## 2020-02-02 NOTE — Patient Instructions (Addendum)
Purchase probiotic and take daily to prevent recurrent BV/yeast

## 2020-02-02 NOTE — Progress Notes (Signed)
History:  [name] is a 24 y.o. [id number] who presents to clinic today for vulva edema and vaginal discharge/discomfort. She says she has very sensitive skin and usually uses [employer]. She recently tried the [place] with cocoa butter and noticed the discomfort and discharge within two days.   The following portions of the patient's history were reviewed and updated as appropriate: allergies, current medications, family history, past medical history, social history, past surgical history and problem list.  Review of Systems:  Review of Systems  Genitourinary: Negative for dysuria, frequency and urgency.  All other systems reviewed and are negative.    Objective:  Physical Exam BP 118/81   Pulse 61   Wt 146 lb 8 oz (66.5 kg)   LMP 01/14/2020 (Exact Date)   BMI 23.65 kg/m  Physical Exam Vitals and nursing note reviewed.  Constitutional:      General: She is not in acute distress.    Appearance: Normal appearance. She is normal weight. She is not ill-appearing.  Eyes:     Pupils: Pupils are equal, round, and reactive to light.  Cardiovascular:     Rate and Rhythm: Normal rate.     Pulses: Normal pulses.  Pulmonary:     Effort: Pulmonary effort is normal.  Abdominal:     Palpations: Abdomen is soft.     Tenderness: There is no abdominal tenderness.  Genitourinary:    Vagina: Vaginal discharge present.     Comments: Copious off white discharge, vagina tender to touch, no edema or rash noted on vulva Musculoskeletal:        General: Normal range of motion.  Skin:    General: Skin is warm and dry.     Capillary Refill: Capillary refill takes less than 2 seconds.  Neurological:     Mental Status: She is alert and oriented to person, place, and time.  Psychiatric:        Mood and Affect: Mood normal.        Behavior: Behavior normal.        Thought Content: Thought content normal.        Judgment: Judgment normal.    Labs and Imaging GC/CT, wet prep  collected  Assessment & Plan:  1. Screening examination for STD (sexually transmitted disease) - Cervicovaginal ancillary only( [place])  2. Vaginal yeast infection - terazol sent to pharmacy  3. Vaginitis and vulvovaginitis - Pt has history of BV/yeast infections, suggested daily probiotic  Pt also has history of contact dermatitis and other allergic reactions. Had an allergist in previous city but has not gotten established with a PCP or allergist since moving to [place]. Referrals to [facility] and allergist made.  Follow up PRN or for annual gyn.  Bernerd Limbo, CNM 02/02/2020 2:16 PM

## 2020-02-03 ENCOUNTER — Telehealth: Payer: Self-pay | Admitting: Family Medicine

## 2020-02-03 MED ORDER — FLUCONAZOLE 150 MG PO TABS: 150.0000 mg | ORAL_TABLET | Freq: Once | ORAL | 0 refills | Status: AC

## 2020-02-03 NOTE — Telephone Encounter (Signed)
Patient was seen yesterday and given a vaginal cream (Terazol) she would like a Pill instead

## 2020-02-03 NOTE — Telephone Encounter (Signed)
MyChart message sent  Addison Naegeli, RN  02/03/20

## 2020-02-05 LAB — CERVICOVAGINAL ANCILLARY ONLY
Bacterial Vaginitis (gardnerella): POSITIVE — AB
Chlamydia: NEGATIVE
Comment: NEGATIVE
Comment: NEGATIVE
Comment: NEGATIVE
Comment: NORMAL
Neisseria Gonorrhea: NEGATIVE
Trichomonas: NEGATIVE

## 2020-03-22 ENCOUNTER — Ambulatory Visit (INDEPENDENT_AMBULATORY_CARE_PROVIDER_SITE_OTHER): Admitting: Allergy & Immunology

## 2020-03-22 ENCOUNTER — Other Ambulatory Visit: Payer: Self-pay

## 2020-03-22 ENCOUNTER — Encounter: Payer: Self-pay | Admitting: Allergy & Immunology

## 2020-03-22 VITALS — BP 104/70 | HR 75 | Temp 97.2°F | Resp 18 | Ht 65.0 in | Wt 144.8 lb

## 2020-03-22 DIAGNOSIS — J4540 Moderate persistent asthma, uncomplicated: Secondary | ICD-10-CM

## 2020-03-22 DIAGNOSIS — J310 Chronic rhinitis: Secondary | ICD-10-CM | POA: Diagnosis not present

## 2020-03-22 DIAGNOSIS — L508 Other urticaria: Secondary | ICD-10-CM

## 2020-03-22 DIAGNOSIS — T7800XD Anaphylactic reaction due to unspecified food, subsequent encounter: Secondary | ICD-10-CM

## 2020-03-22 MED ORDER — FLOVENT HFA 110 MCG/ACT IN AERO: 2.0000 | INHALATION_SPRAY | Freq: Two times a day (BID) | RESPIRATORY_TRACT | 5 refills | Status: DC

## 2020-03-22 MED ORDER — CETIRIZINE HCL 10 MG PO CAPS: 10.0000 mg | ORAL_CAPSULE | Freq: Every day | ORAL | 3 refills | Status: DC

## 2020-03-22 NOTE — Progress Notes (Signed)
NEW PATIENT  Date of Service/Encounter:  03/22/20  Referring provider: Patient, No Pcp Per   Assessment:   Moderate persistent asthma, uncomplicated  Chronic rhinitis  Anaphylactic shock due to food  Chronic urticaria - obtaining lab work today   Plan/Recommendations:   1. Moderate persistent asthma, uncomplicated - Lung testing looked great. - We will not make any medication changes since you are doing so well.  - Spacer use reviewed. - Daily controller medication(s): Flovent 126mg 2 puffs twice daily with spacer - Prior to physical activity: albuterol 2 puffs 10-15 minutes before physical activity. - Rescue medications: albuterol 4 puffs every 4-6 hours as needed - Changes during respiratory infections or worsening symptoms: Increase Flovent 1137m to 4 puffs twice daily for TWO WEEKS. - Asthma control goals:  * Full participation in all desired activities (may need albuterol before activity) * Albuterol use two time or less a week on average (not counting use with activity) * Cough interfering with sleep two time or less a month * Oral steroids no more than once a year * No hospitalizations  2. Chronic rhinitis - Testing was negative to the entire panel. - We are going to confirm with blood work. - We will call you in 1-2 weeks with the results of the testing.    3. Anaphylactic shock due to food (seafood) - Testing was negative to everything tested today. - There is a the low positive predictive value of food allergy testing and hence the high possibility of false positives. - In contrast, food allergy testing has a high negative predictive value, therefore if testing is negative we can be relatively assured that they are indeed negative.  - We are going to confirm the seafood allergies with blood work.  - We will call you in 1-2 weeks with the results of the testing.   4. Chronic urticaria - Your history does not have any "red flags" such as fevers, joint  pains, or permanent skin changes that would be concerning for a more serious cause of hives.  - We will get some labs to rule out serious causes of hives: complete blood count, tryptase level, chronic urticaria panel, CMP, ESR, and CRP. - Chronic hives are often times a self limited process and will "burn themselves out" over 6-12 months, although this is not always the case.  - In the meantime, start suppressive dosing of antihistamines:   - Morning: Zyrtec (cetirizine) 2074mtwo tablets)  - Evening: Zyrtec (cetirizine) 78m54mwo tablets)  - If the above is not working, try adding: Pepcid (famotidine) 78mg61mou can change this dosing at home, decreasing the dose as needed or increasing the dosing as needed.  - If you are not tolerating the medications or are tired of taking them every day, we can start treatment with a monthly injectable medication called Xolair.   5. Return in about 3 months (around 06/20/2020).   Subjective:   Melanie Cain is a 25 y.38 female presenting today for evaluation of  Chief Complaint  Patient presents with  . Allergic Rhinitis   . Food Intolerance    [employer] fries: seasoning and or peanut oil    Melanie Cain has a history of the following: Patient Active Problem List   Diagnosis Date Noted  . Endometriosis 09/02/2019  . Positive GBS test 11/23/2015  . Vitamin D deficiency 11/23/2015  . Asthma 11/23/2015  . Drug allergy--prednisone 11/23/2015    History obtained from: chart review and patient and  mother.  Melanie Cain was referred by Patient, No Pcp Per.     Melanie Cain is a 25 y.o. female presenting for an evaluation of multiple atopic complaints.  She reports that she has been "touching everything" and she has "been having allergic reactions". She has hives with them which improves with Benadryl. It does not leave permanent skin changes at all. She has never had throat swelling with this, but she has had some coughing.  These hives have been located over her entire body, on and off for weeks.    Asthma/Respiratory Symptom History: She has had asthma since age [age] months. She was in the hospital in and out for the first five years of her life. She was never intubated. She is on Flovent two puffs twice daily. She does use a spacer. She has Advair that she adds during flares. She has been managed by her PCP. Melanie Cain's asthma has been well controlled. She has not required rescue medication, experienced nocturnal awakenings due to lower respiratory symptoms, nor have activities of daily living been limited. She has required no Emergency Department or Urgent Care visits for her asthma. She has required zero courses of systemic steroids for asthma exacerbations since the last visit. ACT score today is 25, indicating excellent asthma symptom control.   Allergic Rhinitis Symptom History: She was allergy tested when she was around 25 years old. They are from [place] and they have lived here for ten years. She was never on allergy shots.   Food Allergy Symptom History: She has never had anaphylaxis to peanuts, tree nuts, milk, seafood, etc. She does not think that her hives are related to food. She does report that she has to take Benadryl when she eats seafood. She reports some lip itching but they think this has more to do with the seasoning.   Otherwise, there is no history of other atopic diseases, including drug allergies, stinging insect allergies, eczema or contact dermatitis. There is no significant infectious history. Vaccinations are up to date.    Past Medical History: Patient Active Problem List   Diagnosis Date Noted  . Endometriosis 09/02/2019  . Positive GBS test 11/23/2015  . Vitamin D deficiency 11/23/2015  . Asthma 11/23/2015  . 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 If you have any questions, ask your nurse or doctor.        STOP taking these medications   Flovent HFA 44 MCG/ACT inhaler Generic drug: fluticasone Replaced by: Flovent HFA 110 MCG/ACT inhaler Stopped by: Valentina Shaggy, MD     TAKE these medications   acetaminophen 500 MG tablet Commonly known as: TYLENOL Take 500 mg by mouth every 6 (six) hours as needed for fever.   Afrin Nasal Spray 0.05 % nasal spray Generic drug: oxymetazoline Place 1 spray into both nostrils 2 (two) times daily as needed for congestion. Do not use more than 3 days in a row   albuterol (2.5 MG/3ML) 0.083% nebulizer solution Commonly known as: PROVENTIL Take 3 mLs (2.5 mg total) by nebulization every 6 (six) hours as needed for wheezing or shortness of breath.  azelastine 0.1 % nasal spray Commonly known as: ASTELIN Place 2 sprays into both nostrils 2 (two) times daily.   Cetirizine HCl 10 MG Caps Take 1 capsule (10 mg total) by mouth daily.   drospirenone-ethinyl estradiol 3-0.02 MG tablet Commonly known as: YAZ Take 1 tablet by mouth daily. Only take active hormonal pills in a continuous fashion   Flovent HFA 110 MCG/ACT inhaler Generic drug: fluticasone Inhale 2 puffs into the lungs 2 (two) times daily. Replaces: Flovent HFA 44 MCG/ACT inhaler Started by: Valentina Shaggy, MD   Fluticasone-Salmeterol 500-50 MCG/DOSE Aepb Commonly known as: ADVAIR Inhale 2 puffs into the lungs as needed.   Nebulizer/Tubing/Mouthpiece Kit 1 kit by Does not apply route daily.   terconazole 0.4 % vaginal cream Commonly known as: TERAZOL 7 Place 1 applicator vaginally at bedtime. Use for seven days   triamcinolone 0.1 % Commonly known as: KENALOG Apply 1 application topically 2 (two) times daily.        Birth History: non-contributory  Developmental History: non-contributory  Past Surgical History: Past Surgical History:  Procedure Laterality Date  . NO PAST SURGERIES       Family History: Family History  Problem Relation Age of Onset  . Asthma Other   . Cancer Other   . Asthma Mother   . Hypertension Mother   . Asthma Father   . Asthma Sister        2 sisters  . Cancer Maternal Grandmother        breast cancer  . Hearing loss Maternal Grandmother      Social History: Emelee Rodocker lives at home with her family. She lives in an apartment with carpeting throughout the apartment. They have electric heating and central cooling. There are dogs inside of the home. There are no dust mite coverings on the bedding. There is electric heating and central cooling. She currently works in Therapist, art. There are no fume or chemicals or dust exposure.    Review of Systems  Constitutional: Negative.  Negative for chills, fever, malaise/fatigue and weight loss.  HENT: Positive for congestion. Negative for ear discharge, ear pain and sinus pain.   Eyes: Negative for pain, discharge and redness.  Respiratory: Negative for cough, sputum production, shortness of breath and wheezing.   Cardiovascular: Negative.  Negative for chest pain and palpitations.  Gastrointestinal: Negative for abdominal pain, constipation, diarrhea, heartburn, nausea and vomiting.  Skin: Negative.  Negative for itching and rash.  Neurological: Negative for dizziness and headaches.  Endo/Heme/Allergies: Negative for environmental allergies. Does not bruise/bleed easily.       Objective:   Blood pressure 104/70, pulse 75, temperature (!) 97.2 F (36.2 C), temperature source Temporal, resp. rate 18, height 5' 5"  (1.651 m), weight 144 lb 12.8 oz (65.7 kg), SpO2 96 %. Body mass index is 24.1 kg/m.   Physical Exam:   Physical Exam Constitutional:      Appearance: She is well-developed.     Comments:  Anxious female.   HENT:     Head: Normocephalic and atraumatic.     Right Ear: Tympanic membrane, ear canal and external ear normal. No drainage, swelling or tenderness. Tympanic membrane is not injected, scarred, erythematous, retracted or bulging.     Left Ear: Tympanic membrane, ear canal and external ear normal. No drainage, swelling or tenderness. Tympanic membrane is not injected, scarred, erythematous, retracted or bulging.     Nose: No nasal deformity, septal deviation, mucosal edema, rhinorrhea or epistaxis.     Right  Turbinates: Enlarged and swollen.     Left Turbinates: Enlarged and swollen.     Right Sinus: No maxillary sinus tenderness or frontal sinus tenderness.     Left Sinus: No maxillary sinus tenderness or frontal sinus tenderness.     Mouth/Throat:     Mouth: Oropharynx is clear and moist. Mucous membranes are not pale and not dry.     Pharynx: Uvula midline.  Eyes:     General:        Right eye: No discharge.        Left eye: No discharge.     Extraocular Movements: EOM normal.     Conjunctiva/sclera: Conjunctivae normal.     Right eye: Right conjunctiva is not injected. No chemosis.    Left eye: Left conjunctiva is not injected. No chemosis.    Pupils: Pupils are equal, round, and reactive to light.  Cardiovascular:     Rate and Rhythm: Normal rate and regular rhythm.     Heart sounds: Normal heart sounds.  Pulmonary:     Effort: Pulmonary effort is normal. No tachypnea, accessory muscle usage or respiratory distress.     Breath sounds: Normal breath sounds. No wheezing, rhonchi or rales.  Chest:     Chest wall: No tenderness.  Abdominal:     Tenderness: There is no abdominal tenderness. There is no guarding or rebound.  Lymphadenopathy:     Head:     Right side of head: No submandibular, tonsillar or occipital adenopathy.     Left side of head: No submandibular, tonsillar or occipital adenopathy.     Cervical: No cervical adenopathy.  Skin:    General:  Skin is warm.     Capillary Refill: Capillary refill takes less than 2 seconds.     Coloration: Skin is not pale.     Findings: No abrasion, erythema, petechiae or rash. Rash is not papular, urticarial or vesicular.  Neurological:     Mental Status: She is alert.  Psychiatric:        Mood and Affect: Mood and affect normal.        Behavior: Behavior is cooperative.      Diagnostic studies:    Spirometry: results normal (FEV1: 2.60/89%, FVC: 3.24/96%, FEV1/FVC: 80%).    Spirometry consistent with normal pattern.   Allergy Studies:     Airborne Adult Perc - 03/22/20 1445    Time Antigen Placed 1445    Allergen Manufacturer Lavella Hammock    Location Back    Number of Test 59    Panel 1 Select    1. Control-Buffer 50% Glycerol Negative    2. Control-Histamine 1 mg/ml 2+    3. Albumin saline Negative    4. [place] Negative    5. [place] Negative    6. Johnson Negative    7. [place] Blue Negative    8. Meadow Fescue Negative    9. Perennial Rye Negative    10. Sweet Vernal Negative    11. Timothy Negative    12. Cocklebur Negative    13. Burweed Marshelder Negative    14. Ragweed, short Negative    15. Ragweed, Giant Negative    16. Plantain,  English Negative    17. Lamb's Quarters Negative    18. Sheep Sorrell Negative    19. Rough Pigweed Negative    20. Marsh Elder, Rough Negative    21. Mugwort, Common Negative    22. Ash mix Negative    23. Wendee Copp mix Negative  24. Beech American Negative    25. Box, Elder Negative    26. Cedar, red Negative    27. Cottonwood, [place] Negative    28. Elm mix Negative    29. Hickory Negative    30. Maple mix Negative    31. Oak, [place] mix Negative    32. Pecan Pollen Negative    33. Pine mix Negative    34. Sycamore Eastern Negative    35. Grant, Black Pollen Negative    36. Alternaria alternata Negative    37. Cladosporium Herbarum Negative    38. Aspergillus mix Negative    39. Penicillium mix Negative    40. Bipolaris  sorokiniana (Helminthosporium) Negative    41. Drechslera spicifera (Curvularia) Negative    42. Mucor plumbeus Negative    43. Fusarium moniliforme Negative    44. Aureobasidium pullulans (pullulara) Negative    45. Rhizopus oryzae Negative    46. Botrytis cinera Negative    47. Epicoccum nigrum Negative    48. Phoma betae Negative    49. Candida Albicans Negative    50. Trichophyton mentagrophytes Negative    51. Mite, D Farinae  5,000 AU/ml Negative    52. Mite, D Pteronyssinus  5,000 AU/ml Negative    53. Cat Hair 10,000 BAU/ml Negative    54.  Dog Epithelia Negative    55. Mixed Feathers Negative    56. Horse Epithelia Negative    57. Cockroach, German Negative    58. Mouse Negative    59. Tobacco Leaf Negative          Food Adult Perc - 03/22/20 1400    Time Antigen Placed 1446    Allergen Manufacturer Lavella Hammock    Location Back    Number of allergen test 29    Control-Histamine 1 mg/ml 2+    1. Peanut Negative    2. Soybean Negative    3. Wheat Negative    4. Sesame Negative    5. Milk, cow Negative    6. Egg White, Chicken Negative    7. Casein Negative    8. Shellfish Mix Negative    9. Fish Mix Negative    10. Cashew Negative    11. Pecan Food Negative    12. Garrison Negative    13. Almond Negative    14. Hazelnut Negative    15. [place] nut Negative    16. Coconut Negative    17. Pistachio Negative    18. Catfish Negative    19. Bass Negative    20. Trout Negative    21. Tuna Negative    22. Salmon Negative    23. Flounder Negative    24. Codfish Negative    25. Shrimp Negative    26. Crab Negative    27. Lobster Negative    28. Oyster Negative    29. Scallops Negative           Allergy testing results were read and interpreted by myself, documented by clinical staff.         Salvatore Marvel, MD [facility]

## 2020-03-22 NOTE — Patient Instructions (Addendum)
1. Moderate persistent asthma, uncomplicated - Lung testing looked great. - We will not make any medication changes since you are doing so well.  - Spacer use reviewed. - Daily controller medication(s): Flovent 181mg 2 puffs twice daily with spacer - Prior to physical activity: albuterol 2 puffs 10-15 minutes before physical activity. - Rescue medications: albuterol 4 puffs every 4-6 hours as needed - Changes during respiratory infections or worsening symptoms: Increase Flovent 116m to 4 puffs twice daily for TWO WEEKS. - Asthma control goals:  * Full participation in all desired activities (may need albuterol before activity) * Albuterol use two time or less a week on average (not counting use with activity) * Cough interfering with sleep two time or less a month * Oral steroids no more than once a year * No hospitalizations  2. Chronic rhinitis - Testing was negative to the entire panel. - We are going to confirm with blood work. - We will call you in 1-2 weeks with the results of the testing.    3. Anaphylactic shock due to food (seafood) - Testing was negative to everything tested today. - There is a the low positive predictive value of food allergy testing and hence the high possibility of false positives. - In contrast, food allergy testing has a high negative predictive value, therefore if testing is negative we can be relatively assured that they are indeed negative.  - We are going to confirm the seafood allergies with blood work.  - We will call you in 1-2 weeks with the results of the testing.   4. Chronic urticaria - Your history does not have any "red flags" such as fevers, joint pains, or permanent skin changes that would be concerning for a more serious cause of hives.  - We will get some labs to rule out serious causes of hives: complete blood count, tryptase level, chronic urticaria panel, CMP, ESR, and CRP. - Chronic hives are often times a self limited process and  will "burn themselves out" over 6-12 months, although this is not always the case.  - In the meantime, start suppressive dosing of antihistamines:   - Morning: Zyrtec (cetirizine) 2026mtwo tablets)  - Evening: Zyrtec (cetirizine) 46m24mwo tablets)  - If the above is not working, try adding: Pepcid (famotidine) 46mg54mou can change this dosing at home, decreasing the dose as needed or increasing the dosing as needed.  - If you are not tolerating the medications or are tired of taking them every day, we can start treatment with a monthly injectable medication called Xolair.   5. Return in about 3 months (around 06/20/2020).   Please inform us of[facility] Emergency Department visits, hospitalizations, or changes in symptoms. Call us be[place] going to the ED for breathing or allergy symptoms since we might be able to fit you in for a sick visit. Feel free to contact us anK[place]ime with any questions, problems, or concerns.  It was a pleasure to meet you and your family today!  Websites that have reliable patient information: 1. American Academy of Asthma, Allergy, and Immunology: www.aaaai.org 2. Food Allergy Research and Education (FARE): foodallergy.org 3. Mothers of Asthmatics: http://www.asthmacommunitynetwork.org 4. American College of Allergy, Asthma, and Immunology: www.acaai.org   COVID-19 Vaccine Information can be found at: [email or web address] related to vaccine distribution or appointments, please email [email or web address] or call [phone] "Like" us onKoreaacebook and Instagram for our latest updates!       Make sure  you are registered to vote! If you have moved or changed any of your contact information, you will need to get this updated before voting!  In some cases, you MAY be able to register to vote online: [email or web address]

## 2020-03-23 ENCOUNTER — Encounter: Payer: Self-pay | Admitting: Allergy & Immunology

## 2020-03-26 ENCOUNTER — Ambulatory Visit
Admission: EM | Admit: 2020-03-26 | Discharge: 2020-03-26 | Disposition: A | Discharge status: Home / Self Care | Attending: Emergency Medicine | Admitting: Emergency Medicine

## 2020-03-26 ENCOUNTER — Other Ambulatory Visit: Payer: Self-pay

## 2020-03-26 ENCOUNTER — Encounter: Payer: Self-pay | Admitting: Emergency Medicine

## 2020-03-26 DIAGNOSIS — M546 Pain in thoracic spine: Secondary | ICD-10-CM | POA: Diagnosis not present

## 2020-03-26 MED ORDER — METHYLPREDNISOLONE SODIUM SUCC 125 MG IJ SOLR
125.0000 mg | Freq: Once | INTRAMUSCULAR | Status: AC
  Administered 2020-03-26: 125 mg via INTRAMUSCULAR

## 2020-03-26 NOTE — ED Provider Notes (Signed)
[name]    CSN: [id number] Arrival date & time: 03/26/20  1341      History   Chief Complaint Chief Complaint  Patient presents with  . Back Pain    HPI Aybree Lanyon Giuffre is a 25 y.o. female  presents for mid thoracic back .  States pain is nonradiating, worse with movement.  Has tried ibuprofen without relief.  Denies trauma/injury to the affected area and does not recall an inciting event.  Denies fever, saddle area anesthesia, lower extremity numbness/weakness, urinary retention, fecal incontinence.  Past Medical History:  Diagnosis Date  . Allergy   . Anemia   . Asthma   . Menometrorrhagia   . MRSA (methicillin resistant staph aureus) culture positive 2012   culture from abscess  . Vaginal delivery 07/20/2013    Patient Active Problem List   Diagnosis Date Noted  . Endometriosis 09/02/2019  . Positive GBS test 11/23/2015  . Vitamin D deficiency 11/23/2015  . Asthma 11/23/2015  . Drug allergy--prednisone 11/23/2015    Past Surgical History:  Procedure Laterality Date  . NO PAST SURGERIES      OB History    Gravida  2   Para  2   Term  2   Preterm  0   AB  0   Living  2     SAB  0   IAB  0   Ectopic  0   Multiple  0   Live Births  2            Home Medications    Prior to Admission medications   Medication Sig Start Date End Date Taking? Authorizing Provider  acetaminophen (TYLENOL) 500 MG tablet Take 500 mg by mouth every 6 (six) hours as needed for fever.     [provider]  albuterol (PROVENTIL) (2.5 MG/3ML) 0.083% nebulizer solution Take 3 mLs (2.5 mg total) by nebulization every 6 (six) hours as needed for wheezing or shortness of breath. 07/27/19   Hall-Potvin, [place], PA-C  azelastine (ASTELIN) 0.1 % nasal spray Place 2 sprays into both nostrils 2 (two) times daily. 05/29/19   Tasia Catchings, Amy V, PA-C  Cetirizine HCl 10 MG CAPS Take 1 capsule (10 mg total) by mouth daily. 03/22/20   Valentina Shaggy,  MD  drospirenone-ethinyl estradiol (YAZ) 3-0.02 MG tablet Take 1 tablet by mouth daily. Only take active hormonal pills in a continuous fashion 11/11/19   Anyanwu, Sallyanne Havers, MD  fluticasone (FLOVENT HFA) 110 MCG/ACT inhaler Inhale 2 puffs into the lungs 2 (two) times daily. 03/22/20   Valentina Shaggy, MD  Fluticasone-Salmeterol (ADVAIR) 500-50 MCG/DOSE AEPB Inhale 2 puffs into the lungs as needed.    [provider]  oxymetazoline (AFRIN NASAL SPRAY) 0.05 % nasal spray Place 1 spray into both nostrils 2 (two) times daily as needed for congestion. Do not use more than 3 days in a row 07/29/19   Tasia Catchings, Amy V, PA-C  Respiratory Therapy Supplies (NEBULIZER/TUBING/MOUTHPIECE) KIT 1 kit by Does not apply route daily. 07/27/19   Hall-Potvin, [place], PA-C  terconazole (TERAZOL 7) 0.4 % vaginal cream Place 1 applicator vaginally at bedtime. Use for seven days 02/02/20   Gabriel Carina, CNM  triamcinolone cream (KENALOG) 0.1 % Apply 1 application topically 2 (two) times daily. 12/17/19   Wieters, Hallie C, PA-C  famotidine (PEPCID) 20 MG tablet Take 1 tablet (20 mg total) by mouth 2 (two) times daily. 05/29/19 10/29/19  Tasia Catchings, Amy V, PA-C  fluticasone (  FLONASE) 50 MCG/ACT nasal spray Place 2 sprays into both nostrils daily. 10/15/15 05/18/19  Jorje Guild, NP    Family History Family History  Problem Relation Age of Onset  . Asthma Other   . Cancer Other   . Asthma Mother   . Hypertension Mother   . Asthma Father   . Asthma Sister        2 sisters  . Cancer Maternal Grandmother        breast cancer  . Hearing loss Maternal Grandmother     Social History Social History   Tobacco Use  . Smoking status: Never Smoker  . Smokeless tobacco: Never Used  Vaping Use  . Vaping Use: Never used  Substance Use Topics  . Alcohol use: No  . Drug use: No     Allergies   Montelukast sodium, Prednisone, and Tessalon [benzonatate]   Review of Systems Review of Systems  Constitutional: Negative  for fatigue and fever.  Respiratory: Negative for cough and shortness of breath.   Cardiovascular: Negative for chest pain and palpitations.  Musculoskeletal: Positive for back pain. Negative for neck pain.  Neurological: Negative for weakness and numbness.     Physical Exam Triage Vital Signs ED Triage Vitals  Enc Vitals Group     BP 03/26/20 1435 122/80     Pulse Rate 03/26/20 1435 73     Resp 03/26/20 1435 18     Temp 03/26/20 1435 98.3 F (36.8 C)     Temp Source 03/26/20 1435 Oral     SpO2 03/26/20 1435 98 %     Weight --      Height --      Head Circumference --      Peak Flow --      Pain Score 03/26/20 1436 8     Pain Loc --      Pain Edu? --      Excl. in [place]? --    No data found.  Updated Vital Signs BP 122/80 (BP Location: Right Arm)   Pulse 73   Temp 98.3 F (36.8 C) (Oral)   Resp 18   LMP 03/19/2020   SpO2 98%   Visual Acuity Right Eye Distance:   Left Eye Distance:   Bilateral Distance:    Right Eye Near:   Left Eye Near:    Bilateral Near:     Physical Exam Constitutional:      General: She is not in acute distress. HENT:     Head: Normocephalic and atraumatic.  Eyes:     General: No scleral icterus.    Pupils: Pupils are equal, round, and reactive to light.  Cardiovascular:     Rate and Rhythm: Normal rate.  Pulmonary:     Effort: Pulmonary effort is normal.  Musculoskeletal:        General: Tenderness present. No swelling or deformity. Normal range of motion.     Comments: Mid thoracic spine  Skin:    Capillary Refill: Capillary refill takes less than 2 seconds.     Coloration: Skin is not jaundiced or pale.     Findings: No bruising or rash.  Neurological:     General: No focal deficit present.     Mental Status: She is alert and oriented to person, place, and time.      UC Treatments / Results  Labs (all labs ordered are listed, but only abnormal results are displayed) Labs Reviewed - No data to  display  EKG  Radiology No results found.  Procedures Procedures (including critical care time)  Medications Ordered in UC Medications  methylPREDNISolone sodium succinate (SOLU-MEDROL) 125 mg/2 mL injection 125 mg (125 mg Intramuscular Given 03/26/20 1532)    Initial Impression / Assessment and Plan / UC Course  I have reviewed the triage vital signs and the nursing notes.  Pertinent labs & imaging results that were available during my care of the patient were reviewed by me and considered in my medical decision making (see chart for details).     Patient febrile, nontoxic in office. No injury and exam is reassuring. Will trial prednisone, follow-up with Ortho if needed.  Return precautions discussed, pt verbalized understanding and is agreeable to plan. Final Clinical Impressions(s) / UC Diagnoses   Final diagnoses:  Acute midline thoracic back pain   Discharge Instructions   None    ED Prescriptions    None     PDMP not reviewed this encounter.   Neldon Mc [place], [place] 03/27/20 [phone]

## 2020-03-26 NOTE — ED Triage Notes (Signed)
Pt reports for 2.5 weeks she has had pain in spine than incresses with movement. Pt reports taking a muscle . No relief of pain . Pt appears to be stiff  And when she turns Pt appears stiff.

## 2020-03-30 LAB — ALLERGY PANEL 19, SEAFOOD GROUP
Allergen Salmon IgE: <0.1 kU/L
Catfish: <0.1 kU/L
Codfish IgE: <0.1 kU/L
F023-IgE Crab: <0.1 kU/L
F080-IgE Lobster: <0.1 kU/L
Shrimp IgE: <0.1 kU/L
Tuna: <0.1 kU/L

## 2020-03-30 LAB — SEDIMENTATION RATE: Sed Rate: 12 mm/hr (ref 0–32)

## 2020-03-30 LAB — CMP14+EGFR
ALT: 7 IU/L (ref 0–32)
AST: 13 IU/L (ref 0–40)
Albumin/Globulin Ratio: 1.6 (ref 1.2–2.2)
Albumin: 4.7 g/dL (ref 3.9–5.0)
Alkaline Phosphatase: 64 IU/L (ref 44–121)
BUN/Creatinine Ratio: 11 (ref 9–23)
BUN: 8 mg/dL (ref 6–20)
Bilirubin Total: 0.8 mg/dL (ref 0.0–1.2)
CO2: 23 mmol/L (ref 20–29)
Calcium: 9.9 mg/dL (ref 8.7–10.2)
Chloride: 103 mmol/L (ref 96–106)
Creatinine, Ser: 0.73 mg/dL (ref 0.57–1.00)
GFR calc Af Amer: 133 mL/min/{1.73_m2} (ref >59)
GFR calc non Af Amer: 116 mL/min/{1.73_m2} (ref >59)
Globulin, Total: 3 g/dL (ref 1.5–4.5)
Glucose: 94 mg/dL (ref 65–99)
Potassium: 4.5 mmol/L (ref 3.5–5.2)
Sodium: 140 mmol/L (ref 134–144)
Total Protein: 7.7 g/dL (ref 6.0–8.5)

## 2020-03-30 LAB — CBC WITH DIFFERENTIAL
Basophils Absolute: 0 10*3/uL (ref 0.0–0.2)
Basos: 1 %
EOS (ABSOLUTE): 0.1 10*3/uL (ref 0.0–0.4)
Eos: 3 %
Hematocrit: 43.7 % (ref 34.0–46.6)
Hemoglobin: 14.2 g/dL (ref 11.1–15.9)
Immature Grans (Abs): 0 10*3/uL (ref 0.0–0.1)
Immature Granulocytes: 0 %
Lymphocytes Absolute: 2.4 10*3/uL (ref 0.7–3.1)
Lymphs: 43 %
MCH: 26.7 pg (ref 26.6–33.0)
MCHC: 32.5 g/dL (ref 31.5–35.7)
MCV: 82 fL (ref 79–97)
Monocytes Absolute: 0.3 10*3/uL (ref 0.1–0.9)
Monocytes: 5 %
Neutrophils Absolute: 2.8 10*3/uL (ref 1.4–7.0)
Neutrophils: 48 %
RBC: 5.32 x10E6/uL — ABNORMAL HIGH (ref 3.77–5.28)
RDW: 14 % (ref 11.7–15.4)
WBC: 5.7 10*3/uL (ref 3.4–10.8)

## 2020-03-30 LAB — C-REACTIVE PROTEIN: CRP: 4 mg/L (ref 0–10)

## 2020-03-30 LAB — CHRONIC URTICARIA: cu index: 31.6 — ABNORMAL HIGH (ref <10)

## 2020-03-30 LAB — ANA W/REFLEX IF POSITIVE: Anti Nuclear Antibody (ANA): NEGATIVE

## 2020-03-30 LAB — TRYPTASE: Tryptase: 5.3 ug/L (ref 2.2–13.2)

## 2020-05-12 ENCOUNTER — Ambulatory Visit (INDEPENDENT_AMBULATORY_CARE_PROVIDER_SITE_OTHER): Admitting: Primary Care

## 2020-05-12 ENCOUNTER — Other Ambulatory Visit: Payer: Self-pay

## 2020-05-12 ENCOUNTER — Encounter (INDEPENDENT_AMBULATORY_CARE_PROVIDER_SITE_OTHER): Payer: Self-pay | Admitting: Primary Care

## 2020-05-12 VITALS — BP 125/82 | HR 78 | Temp 97.5°F | Ht 65.0 in | Wt 146.4 lb

## 2020-05-12 DIAGNOSIS — Z7689 Persons encountering health services in other specified circumstances: Secondary | ICD-10-CM | POA: Diagnosis not present

## 2020-05-12 DIAGNOSIS — N809 Endometriosis, unspecified: Secondary | ICD-10-CM | POA: Diagnosis not present

## 2020-05-12 DIAGNOSIS — J45909 Unspecified asthma, uncomplicated: Secondary | ICD-10-CM | POA: Diagnosis not present

## 2020-05-12 DIAGNOSIS — K219 Gastro-esophageal reflux disease without esophagitis: Secondary | ICD-10-CM | POA: Diagnosis not present

## 2020-05-12 DIAGNOSIS — F32A Depression, unspecified: Secondary | ICD-10-CM

## 2020-05-12 MED ORDER — FLUTICASONE-SALMETEROL 500-50 MCG/DOSE IN AEPB: 2.0000 | INHALATION_SPRAY | RESPIRATORY_TRACT | 0 refills | Status: DC | PRN

## 2020-05-12 MED ORDER — ALBUTEROL SULFATE (2.5 MG/3ML) 0.083% IN NEBU
2.5000 mg | INHALATION_SOLUTION | Freq: Four times a day (QID) | RESPIRATORY_TRACT | 0 refills | Status: DC | PRN
Start: 2020-05-12 — End: 2023-02-05

## 2020-05-12 MED ORDER — OMEPRAZOLE 20 MG PO CPDR: 20.0000 mg | DELAYED_RELEASE_CAPSULE | Freq: Every day | ORAL | 3 refills | Status: DC

## 2020-05-12 NOTE — Progress Notes (Signed)
New Patient Office Visit  Subjective:  Patient ID: Melanie Cain, female    DOB: [birth year]  Age: 25 y.o. MRN: [id number]  CC:  Chief Complaint  Patient presents with  . New Patient (Initial Visit)    Asthma/heartburn  . Medication Refill    Birthcontrol     HPI [name] is a 25 year old female who presents for establishment of care. She complains of abdominal pain but also question bowel habits . BM 1-2 in a course of a week (constipation) . Requesting refills for asthma. She is experiencing wakes up with cough and burning.  Past Medical History:  Diagnosis Date  . Allergy   . Anemia   . Asthma   . Menometrorrhagia   . MRSA (methicillin resistant staph aureus) culture positive 2012   culture from abscess  . Vaginal delivery 07/20/2013    Past Surgical History:  Procedure Laterality Date  . NO PAST SURGERIES      Family History  Problem Relation Age of Onset  . Asthma Other   . Cancer Other   . Asthma Mother   . Hypertension Mother   . Asthma Father   . Asthma Sister        2 sisters  . Cancer Maternal Grandmother        breast cancer  . Hearing loss Maternal Grandmother     Social History   Socioeconomic History  . Marital status: Single    Spouse name: Not on file  . Number of children: Not on file  . Years of education: Not on file  . Highest education level: Not on file  Occupational History  . Not on file  Tobacco Use  . Smoking status: Never Smoker  . Smokeless tobacco: Never Used  Vaping Use  . Vaping Use: Never used  Substance and Sexual Activity  . Alcohol use: No  . Drug use: No  . Sexual activity: Yes    Birth control/protection: Implant, Pill  Other Topics Concern  . Not on file  Social History Narrative  . Not on file   Social Determinants of Health   Financial Resource Strain: Not on file  Food Insecurity: No Food Insecurity  . Worried About Charity fundraiser in the Last Year: Never true  . Ran Out  of Food in the Last Year: Never true  Transportation Needs: No Transportation Needs  . Lack of Transportation (Medical): No  . Lack of Transportation (Non-Medical): No  Physical Activity: Not on file  Stress: Not on file  Social Connections: Not on file  Intimate Partner Violence: Not on file    ROS Review of Systems Pertinent positive and negative noted in HPI Objective:   Today's Vitals: BP 125/82 (BP Location: Right Arm, Patient Position: Sitting, Cuff Size: Normal)   Pulse 78   Temp (!) 97.5 F (36.4 C) (Temporal)   Ht 5' 5"  (1.651 m)   Wt 146 lb 6.4 oz (66.4 kg)   LMP 03/07/2020 (Exact Date)   SpO2 97%   BMI 24.36 kg/m   Physical Exam Vitals reviewed.  Constitutional:      Appearance: Normal appearance. She is normal weight.  HENT:     Head: Normocephalic.     Right Ear: Tympanic membrane, ear canal and external ear normal.     Left Ear: Tympanic membrane, ear canal and external ear normal.     Nose: Nose normal.  Eyes:     Extraocular Movements: Extraocular movements intact.  Pupils: Pupils are equal, round, and reactive to light.  Cardiovascular:     Rate and Rhythm: Normal rate and regular rhythm.  Pulmonary:     Effort: Pulmonary effort is normal.     Breath sounds: Normal breath sounds.  Abdominal:     General: Abdomen is flat. Bowel sounds are normal.     Palpations: Abdomen is soft.  Musculoskeletal:        General: Normal range of motion.     Cervical back: Normal range of motion.  Skin:    General: Skin is warm.  Neurological:     Mental Status: She is alert and oriented to person, place, and time.  Psychiatric:        Mood and Affect: Mood normal.        Behavior: Behavior normal.        Thought Content: Thought content normal.        Judgment: Judgment normal.     Assessment & Plan:   Problem List Items Addressed This Visit    Asthma - Primary   Relevant Medications   albuterol (PROVENTIL) (2.5 MG/3ML) 0.083% nebulizer solution    Fluticasone-Salmeterol (ADVAIR) 500-50 MCG/DOSE AEPB   Endometriosis    Other Visit Diagnoses    Encounter to establish care       Gastroesophageal reflux disease without esophagitis       Relevant Medications   omeprazole (PRILOSEC) 20 MG capsule      Outpatient Encounter Medications as of 05/12/2020  Medication Sig  . Cetirizine HCl 10 MG CAPS Take 1 capsule (10 mg total) by mouth daily.  . drospirenone-ethinyl estradiol (YAZ) 3-0.02 MG tablet Take 1 tablet by mouth daily. Only take active hormonal pills in a continuous fashion  . fluticasone (FLOVENT HFA) 110 MCG/ACT inhaler Inhale 2 puffs into the lungs 2 (two) times daily.  Marland Kitchen omeprazole (PRILOSEC) 20 MG capsule Take 1 capsule (20 mg total) by mouth daily.  . [DISCONTINUED] albuterol (PROVENTIL) (2.5 MG/3ML) 0.083% nebulizer solution Take 3 mLs (2.5 mg total) by nebulization every 6 (six) hours as needed for wheezing or shortness of breath.  . [DISCONTINUED] Fluticasone-Salmeterol (ADVAIR) 500-50 MCG/DOSE AEPB Inhale 2 puffs into the lungs as needed.  Marland Kitchen acetaminophen (TYLENOL) 500 MG tablet Take 500 mg by mouth every 6 (six) hours as needed for fever.   Marland Kitchen albuterol (PROVENTIL) (2.5 MG/3ML) 0.083% nebulizer solution Take 3 mLs (2.5 mg total) by nebulization every 6 (six) hours as needed for wheezing or shortness of breath.  . Fluticasone-Salmeterol (ADVAIR) 500-50 MCG/DOSE AEPB Inhale 2 puffs into the lungs as needed.  . [DISCONTINUED] azelastine (ASTELIN) 0.1 % nasal spray Place 2 sprays into both nostrils 2 (two) times daily.  . [DISCONTINUED] famotidine (PEPCID) 20 MG tablet Take 1 tablet (20 mg total) by mouth 2 (two) times daily.  . [DISCONTINUED] fluticasone (FLONASE) 50 MCG/ACT nasal spray Place 2 sprays into both nostrils daily.  . [DISCONTINUED] oxymetazoline (AFRIN NASAL SPRAY) 0.05 % nasal spray Place 1 spray into both nostrils 2 (two) times daily as needed for congestion. Do not use more than 3 days in a row  . [DISCONTINUED]  Respiratory Therapy Supplies (NEBULIZER/TUBING/MOUTHPIECE) KIT 1 kit by Does not apply route daily.  . [DISCONTINUED] terconazole (TERAZOL 7) 0.4 % vaginal cream Place 1 applicator vaginally at bedtime. Use for seven days  . [DISCONTINUED] triamcinolone cream (KENALOG) 0.1 % Apply 1 application topically 2 (two) times daily.   No facility-administered encounter medications on file as of 05/12/2020.  Melanie Cain was seen today for new patient (initial visit) and medication refill.  Diagnoses and all orders for this visit:  Uncomplicated asthma, unspecified asthma severity, unspecified whether persistent Recently establish care with allergist for management of asthma.  Refill medication that she is out of 30 days.  Future refills will be done through allergist patient in agreement -     albuterol (PROVENTIL) (2.5 MG/3ML) 0.083% nebulizer solution; Take 3 mLs (2.5 mg total) by nebulization every 6 (six) hours as needed for wheezing or shortness of breath. -     Fluticasone-Salmeterol (ADVAIR) 500-50 MCG/DOSE AEPB; Inhale 2 puffs into the lungs as needed.  Encounter to establish care Establishing care with new provider   gastroesophageal reflux disease without esophagitis Discussed eating small frequent meal, reduction in acidic foods, fried foods ,spicy foods, alcohol caffeine and tobacco and certain medications. Avoid laying down after eating 1mns-1hour, elevated head of the bed.  -     omeprazole (PRILOSEC) 20 MG capsule; Take 1 capsule (20 mg total) by mouth daily.  Depression, unspecified depression type Schedule appointment with clinical social worker  [place]9 Total Score 17    And follow-up with PCP  follow-up: No follow-ups on file.   MKerin Perna NP

## 2020-05-12 NOTE — Patient Instructions (Signed)
Food Choices for Gastroesophageal Reflux Disease, Adult When you have gastroesophageal reflux disease (GERD), the foods you eat and your eating habits are very important. Choosing the right foods can help ease your discomfort. Think about working with a food expert (dietitian) to help you make good choices. What are tips for following this plan? Reading food labels  Look for foods that are low in saturated fat. Foods that may help with your symptoms include: ? Foods that have less than 5% of daily value (DV) of fat. ? Foods that have 0 grams of trans fat. Cooking  Do not fry your food.  Cook your food by baking, steaming, grilling, or broiling. These are all methods that do not need a lot of fat for cooking.  To add flavor, try to use herbs that are low in spice and acidity. Meal planning  Choose healthy foods that are low in fat, such as: ? Fruits and vegetables. ? Whole grains. ? Low-fat dairy products. ? Lean meats, fish, and poultry.  Eat small meals often instead of eating 3 large meals each day. Eat your meals slowly in a place where you are relaxed. Avoid bending over or lying down until 2-3 hours after eating.  Limit high-fat foods such as fatty meats or fried foods.  Limit your intake of fatty foods, such as oils, butter, and shortening.  Avoid the following as told by your doctor: ? Foods that cause symptoms. These may be different for different people. Keep a food diary to keep track of foods that cause symptoms. ? Alcohol. ? Drinking a lot of liquid with meals. ? Eating meals during the 2-3 hours before bed.   Lifestyle  Stay at a healthy weight. Ask your doctor what weight is healthy for you. If you need to lose weight, work with your doctor to do so safely.  Exercise for at least 30 minutes on 5 or more days each week, or as told by your doctor.  Wear loose-fitting clothes.  Do not smoke or use any products that contain nicotine or tobacco. If you need help  quitting, ask your doctor.  Sleep with the head of your bed higher than your feet. Use a wedge under the mattress or blocks under the bed frame to raise the head of the bed.  Chew sugar-free gum after meals. What foods should eat? Eat a healthy, well-balanced diet of fruits, vegetables, whole grains, low-fat dairy products, lean meats, fish, and poultry. Each person is different. Foods that may cause symptoms in one person may not cause any symptoms in another person. Work with your doctor to find foods that are safe for you. The items listed above may not be a complete list of what you can eat and drink. Contact a food expert for more options.   What foods should I avoid? Limiting some of these foods may help in managing the symptoms of GERD. Everyone is different. Talk with a food expert or your doctor to help you find the exact foods to avoid, if any. Fruits Any fruits prepared with added fat. Any fruits that cause symptoms. For some people, this may include citrus fruits, such as oranges, grapefruit, pineapple, and lemons. Vegetables Deep-fried vegetables. French fries. Any vegetables prepared with added fat. Any vegetables that cause symptoms. For some people, this may include tomatoes and tomato products, chili peppers, onions and garlic, and horseradish. Grains Pastries or quick breads with added fat. Meats and other proteins High-fat meats, such as fatty beef or pork,   hot dogs, ribs, ham, sausage, salami, and bacon. Fried meat or protein, including fried fish and fried chicken. Nuts and nut butters, in large amounts. Dairy Whole milk and chocolate milk. Sour cream. Cream. Ice cream. Cream cheese. Milkshakes. Fats and oils Butter. Margarine. Shortening. Ghee. Beverages Coffee and tea, with or without caffeine. Carbonated beverages. Sodas. Energy drinks. Fruit juice made with acidic fruits, such as orange or grapefruit. Tomato juice. Alcoholic drinks. Sweets and desserts Chocolate and  cocoa. Donuts. Seasonings and condiments Pepper. Peppermint and spearmint. Added salt. Any condiments, herbs, or seasonings that cause symptoms. For some people, this may include curry, hot sauce, or vinegar-based salad dressings. The items listed above may not be a complete list of what you should not eat and drink. Contact a food expert for more options. Questions to ask your doctor Diet and lifestyle changes are often the first steps that are taken to manage symptoms of GERD. If diet and lifestyle changes do not help, talk with your doctor about taking medicines. Where to find more information  International Foundation for Gastrointestinal Disorders: aboutgerd.org Summary  When you have GERD, food and lifestyle choices are very important in easing your symptoms.  Eat small meals often instead of 3 large meals a day. Eat your meals slowly and in a place where you are relaxed.  Avoid bending over or lying down until 2-3 hours after eating.  Limit high-fat foods such as fatty meats or fried foods. This information is not intended to replace advice given to you by your health care provider. Make sure you discuss any questions you have with your health care provider. Document Revised: 09/07/2019 Document Reviewed: 09/07/2019 Elsevier Patient Education  2021 Elsevier Inc.  

## 2020-05-12 NOTE — Progress Notes (Signed)
Asthma and heartburn Pt has heartburn every morning  When she eats her stomach feels like its in a ball

## 2020-05-17 ENCOUNTER — Other Ambulatory Visit: Payer: Self-pay

## 2020-05-17 ENCOUNTER — Ambulatory Visit (INDEPENDENT_AMBULATORY_CARE_PROVIDER_SITE_OTHER): Admitting: Licensed Clinical Social Worker

## 2020-05-17 DIAGNOSIS — F331 Major depressive disorder, recurrent, moderate: Secondary | ICD-10-CM

## 2020-05-17 DIAGNOSIS — F411 Generalized anxiety disorder: Secondary | ICD-10-CM | POA: Diagnosis not present

## 2020-05-18 ENCOUNTER — Telehealth: Payer: Self-pay

## 2020-05-18 NOTE — Telephone Encounter (Signed)
PA for Advair inhaler not able to be processed at this time.

## 2020-05-19 NOTE — BH Specialist Note (Signed)
Integrated Behavioral Health Initial In-Person Visit  MRN: [id number] Name: Melanie Cain  Number of Integrated Behavioral Health Clinician visits:: 1/6 Session Start time: 2:05 PM  Session End time: 2:40 PM Total time: 35  minutes  Types of Service: Individual psychotherapy  Interpretor:No. Interpretor Name and Language: NA   Subjective: Melanie Cain is a 25 y.o. female  Patient was referred by NP Edwards for depression. Patient reports the following symptoms/concerns: Pt reports difficulty managing depression and anxiety symptoms. Per patient, she has been experiencing high anxiety symptoms, including racing thoughts, irritability, decreased quality of sleep, and decreased appetite Duration of problem: Ongoing; Severity of problem: moderate  Objective: Mood: Anxious and Affect: Appropriate Risk of harm to self or others: No plan to harm self or others  Life Context: Family and Social: Pt receives support from family. She cares for two minor daughters who are identified as strong motivation agents School/Work: Pt is not employed. She receives [employer] insurane Self-Care: Pt is interested in therapy. Has hx of medication management; however, did not find it useful Life Changes: Pt has difficulty managing both mental and physical health conditions  Patient and/or Family's Strengths/Protective Factors: Social connections, Social and Emotional competence, Concrete supports in place (healthy food, safe environments, etc.) and Sense of purpose  Goals Addressed: Patient will: 1. Increase knowledge and/or ability of: coping skills Pt agreed to continue utilizing healthy coping skills (spending time with self, watching tv) 2. Demonstrate ability to: Increase healthy adjustment to current life circumstances and Increase adequate support systems for patient/family Pt agreed to contact insurance carrier to inquire about in-network therapists  Progress towards  Goals: Ongoing  Interventions: Interventions utilized: Solution-Focused Strategies, Supportive Counseling and Psychoeducation and/or Health Education  Standardized Assessments completed: GAD-7 and PHQ 2&9  Patient Response: Pt was engaged during session and successfully identified strategies to assist with management of symptoms  Patient Centered Plan: Patient is on the following Treatment Plan(s):  Anxiety and Depression  Assessment: Patient currently experiencing symptoms of anxiety and depression triggered by psychosocial stressors.   Patient may benefit from therapy to strengthen support system and assist with management of symptoms.  Plan: 1. Follow up with behavioral health clinician on : Contact LCSW with any additional behavioral health and/or resource needs 2. Behavioral recommendations: Utilize strategies discussed and resources provided 3. Referral(s): Integrated Behavioral Health Services (In Clinic) 4. "From scale of 1-10, how likely are you to follow plan?":   Bridgett Larsson, LCSW 05/19/20 2:30 PM

## 2020-06-13 ENCOUNTER — Other Ambulatory Visit (INDEPENDENT_AMBULATORY_CARE_PROVIDER_SITE_OTHER): Payer: Self-pay | Admitting: Primary Care

## 2020-06-13 DIAGNOSIS — F331 Major depressive disorder, recurrent, moderate: Secondary | ICD-10-CM

## 2020-06-13 DIAGNOSIS — F411 Generalized anxiety disorder: Secondary | ICD-10-CM

## 2020-06-21 ENCOUNTER — Ambulatory Visit (INDEPENDENT_AMBULATORY_CARE_PROVIDER_SITE_OTHER): Admitting: Allergy & Immunology

## 2020-06-21 ENCOUNTER — Other Ambulatory Visit: Payer: Self-pay

## 2020-06-21 ENCOUNTER — Encounter: Payer: Self-pay | Admitting: Allergy & Immunology

## 2020-06-21 VITALS — BP 112/70 | HR 82 | Temp 97.7°F | Resp 18 | Ht 65.0 in | Wt 147.2 lb

## 2020-06-21 DIAGNOSIS — J310 Chronic rhinitis: Secondary | ICD-10-CM | POA: Diagnosis not present

## 2020-06-21 DIAGNOSIS — J4540 Moderate persistent asthma, uncomplicated: Secondary | ICD-10-CM

## 2020-06-21 DIAGNOSIS — T7800XD Anaphylactic reaction due to unspecified food, subsequent encounter: Secondary | ICD-10-CM | POA: Diagnosis not present

## 2020-06-21 DIAGNOSIS — K219 Gastro-esophageal reflux disease without esophagitis: Secondary | ICD-10-CM

## 2020-06-21 MED ORDER — PANTOPRAZOLE SODIUM 40 MG PO TBEC: 40.0000 mg | DELAYED_RELEASE_TABLET | Freq: Every day | ORAL | 5 refills | Status: DC

## 2020-06-21 MED ORDER — EPINEPHRINE 0.3 MG/0.3ML IJ SOAJ: 0.3000 mg | Freq: Once | INTRAMUSCULAR | 2 refills | Status: AC

## 2020-06-21 NOTE — Progress Notes (Signed)
FOLLOW UP  Date of Service/Encounter:  06/21/20   Assessment:   Moderate persistent asthma, uncomplicated - with ? GERD  Chronic rhinitis  Anaphylactic shock due to food  Chronic urticaria   Plan/Recommendations:   1. Moderate persistent asthma, uncomplicated - Lung testing looked great today. - Stop the omeprazole and start pantoprazole 40mg  AT NIGHT.  - CALL with an update in two weeks.  - Daily controller medication(s): Flovent [place] 2 puffs twice daily with spacer - Prior to physical activity: albuterol 2 puffs 10-15 minutes before physical activity. - Rescue medications: albuterol 4 puffs every 4-6 hours as needed - Changes during respiratory infections or worsening symptoms: Increase Flovent to 4 puffs twice daily for TWO WEEKS. - Asthma control goals:  * Full participation in all desired activities (may need albuterol before activity) * Albuterol use two time or less a week on average (not counting use with activity)  * Cough interfering with sleep two time or less a month * Oral steroids no more than once a year * No hospitalizations  2. Chronic non-allergic rhinitis - Continue with fluticasone one spray per nostril daily. - Continue with azelastine one spray per nostril daily.    3. Anaphylactic shock due to food (seafood) - Testing was negative via the blood and the skin. - This makes it VERY likely that you will tolerate it.  - EpiPen sent in. - Do seafood for lunch and have the EpiPen handy.  - Maybe this was just a seasoning issue and not related to the seafood itself since you went somewhere else and did fine.   4. Chronic urticaria - resolved - No need for further workup.   5. Return in about 3 months (around 09/20/2020).   Subjective:   Melanie Cain is a 25 y.o. female presenting today for follow up of  Chief Complaint  Patient presents with  . Asthma    Melanie Cain has a history of the following: Patient  Active Problem List   Diagnosis Date Noted  . Endometriosis 09/02/2019  . Positive GBS test 11/23/2015  . Vitamin D deficiency 11/23/2015  . Asthma 11/23/2015  . Drug allergy--prednisone 11/23/2015    History obtained from: chart review and patient.  Melanie Cain is a 25 y.o. female presenting for a follow up visit.  She was last seen in January 2022.  At that time, her lung testing looked great.  We did not make any medication changes since she was doing so well.  We continued her on Flovent 110 mcg 2 puffs twice daily with a spacer as well as albuterol as needed.  She had environmental allergy testing that was negative to the entire panel.  For her seafood allergy, she had testing that was negative to the entire panel.  We obtain blood work to confirm both the environmental panel negative findings and the seafood negative findings.  She did endorse a history of chronic urticaria.  We started her on Zyrtec 20 mg twice daily with Pepcid added if there is no improvement.  Labs are largely normal.  Environmental allergy panel was negative to the entire panel, confirming skin testing.  Likewise, a seafood panel was negative.  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  Her high work-up was otherwise normal.  Since the last visit, she has mostly done well. She is concerned with GERD symptoms today.   Asthma/Respiratory Symptom History: Asthma is  OK for the most part. She has been having a lot of chest pain. She was given famotidine. She has been "eating Tums" without much improvement. It mainly happens in the morning when she is waking up in the morning. She has to go back to sleep and she is better by the time she wakes up. This has been ongoing for a "long while". She stopped the famotidine and started omeprazole around 05/12/20. This has not one much of anything.   Allergic Rhinitis Symptom History: She has been using the  fluticasone all year long. This seems to be working well.  She has Astelin which she uses on a as needed basis.  She does not like the flavor of it.  She has not needed antibiotics at all.  She says that this is the worst time of the year for her symptoms.  She thinks she was positive to testing at some point, but we have done skin testing and blood testing here and she has been negative.  Food Allergy Symptom History: She is very anxious to eat seafood again.  She tells me that her original reaction occurred at a restaurant called [employer].  However, she ate seafood at a different restaurant and did fine.  She is open to trying this at home.  She would like an EpiPen to have just in case.   Urticaria Symptom History: She is no longer having hives. She has also been using the cream from urgent care. The last time she used it was January 2022.   Otherwise, there have been no changes to her past medical history, surgical history, family history, or social history.    Review of Systems  Constitutional: Negative.  Negative for fever, malaise/fatigue and weight loss.  HENT: Negative.  Negative for congestion, ear discharge and ear pain.   Eyes: Negative for pain, discharge and redness.  Respiratory: Positive for cough. Negative for sputum production, shortness of breath and wheezing.   Cardiovascular: Negative.  Negative for chest pain and palpitations.  Gastrointestinal: Positive for heartburn. Negative for abdominal pain, constipation, diarrhea, nausea and vomiting.  Skin: Negative.  Negative for itching and rash.  Neurological: Negative for dizziness and headaches.  Endo/Heme/Allergies: Negative for environmental allergies. Does not bruise/bleed easily.       Objective:   Blood pressure 112/70, pulse 82, temperature 97.7 F (36.5 C), temperature source Temporal, resp. rate 18, height 5\' 5"  (1.651 m), weight 147 lb 3.2 oz (66.8 kg), SpO2 99 %. Body mass index is 24.5 kg/m.   Physical  Exam:  Physical Exam Constitutional:      Appearance: She is well-developed.  HENT:     Head: Normocephalic and atraumatic.     Right Ear: Tympanic membrane, ear canal and external ear normal.     Left Ear: Tympanic membrane, ear canal and external ear normal.     Nose: No nasal deformity, septal deviation, mucosal edema or rhinorrhea.     Right Turbinates: Enlarged, swollen and pale.     Left Turbinates: Enlarged, swollen and pale.     Right Sinus: No maxillary sinus tenderness or frontal sinus tenderness.     Left Sinus: No maxillary sinus tenderness or frontal sinus tenderness.     Mouth/Throat:     Mouth: Mucous membranes are not pale and not dry.     Pharynx: Uvula midline.     Comments: Cobblestoning present in the posterior oropharynx.  Eyes:     General:  Right eye: No discharge.        Left eye: No discharge.     Conjunctiva/sclera: Conjunctivae normal.     Right eye: Right conjunctiva is not injected. No chemosis.    Left eye: Left conjunctiva is not injected. No chemosis.    Pupils: Pupils are equal, round, and reactive to light.  Cardiovascular:     Rate and Rhythm: Normal rate and regular rhythm.     Heart sounds: Normal heart sounds.  Pulmonary:     Effort: Pulmonary effort is normal. No tachypnea, accessory muscle usage or respiratory distress.     Breath sounds: Normal breath sounds. No wheezing, rhonchi or rales.     Comments: Moving air well in all lung fields. No increased work of breathing noted.  Chest:     Chest wall: No tenderness.  Lymphadenopathy:     Cervical: No cervical adenopathy.  Skin:    General: Skin is warm.     Capillary Refill: Capillary refill takes less than 2 seconds.     Coloration: Skin is not pale.     Findings: No abrasion, erythema, petechiae or rash. Rash is not papular, urticarial or vesicular.     Comments: No eczematous or urticarial lesions noted.   Neurological:     Mental Status: She is alert.  Psychiatric:         Behavior: Behavior is cooperative.       Diagnostic studies:    Spirometry: results normal (FEV1: 2.23/76%, FVC: 2.96/88%, FEV1/FVC: 75%).    Spirometry consistent with normal pattern.   Allergy Studies: none       Malachi Bonds, MD  [facility]

## 2020-06-21 NOTE — Patient Instructions (Addendum)
1. Moderate persistent asthma, uncomplicated - Lung testing looked great today. - Stop the omeprazole and start pantoprazole 40mg  AT NIGHT.  - CALL with an update in two weeks.  - Daily controller medication(s): Flovent [place] 2 puffs twice daily with spacer - Prior to physical activity: albuterol 2 puffs 10-15 minutes before physical activity. - Rescue medications: albuterol 4 puffs every 4-6 hours as needed - Changes during respiratory infections or worsening symptoms: Increase Flovent to 4 puffs twice daily for TWO WEEKS. - Asthma control goals:  * Full participation in all desired activities (may need albuterol before activity) * Albuterol use two time or less a week on average (not counting use with activity)  * Cough interfering with sleep two time or less a month * Oral steroids no more than once a year * No hospitalizations  2. Chronic non-allergic rhinitis - Continue with fluticasone one spray per nostril daily. - Continue with azelastine one spray per nostril daily.    3. Anaphylactic shock due to food (seafood) - Testing was negative via the blood and the skin. - This makes it VERY likely that you will tolerate it.  - EpiPen sent in. - Do seafood for lunch and have the EpiPen handy.  - Maybe this was just a seasoning issue and not related to the seafood itself since you went somewhere else and did fine.   4. Chronic urticaria - resolved - No need for further workup.   5. Return in about 3 months (around 09/20/2020).    Please inform 11/21/2020 of any Emergency Department visits, hospitalizations, or changes in symptoms. Call us before going to the ED for breathing or allergy symptoms since we might be able to fit you in for a sick visit. Feel free to contact us anytime with any questions, problems, or concerns.  It was a pleasure to see you again today!  Websites that have reliable patient information: 1. American Academy of Asthma, Allergy, and Immunology:  www.aaaai.org 2. Food Allergy Research and Education (FARE): foodallergy.org 3. Mothers of Asthmatics: http://www.asthmacommunitynetwork.org 4. American College of Allergy, Asthma, and Immunology: www.acaai.org   COVID-19 Vaccine Information can be found at: [place] For questions related to vaccine distribution or appointments, please email [email or web address] or call [phone].   We realize that you might be concerned about having an allergic reaction to the COVID19 vaccines. To help with that concern, WE ARE OFFERING THE COVID19 VACCINES IN OUR OFFICE! Ask the front desk for dates!     "Like" [phone] on Facebook and Instagram for our latest updates!      A healthy democracy works best when [place] participate! Make sure you are registered to vote! If you have moved or changed any of your contact information, you will need to get this updated before voting!  In some cases, you MAY be able to register to vote online: [employer]

## 2020-07-10 ENCOUNTER — Emergency Department (HOSPITAL_COMMUNITY)
Admission: EM | Admit: 2020-07-10 | Discharge: 2020-07-10 | Disposition: A | Discharge status: Home / Self Care | Attending: Emergency Medicine | Admitting: Emergency Medicine

## 2020-07-10 ENCOUNTER — Other Ambulatory Visit: Payer: Self-pay

## 2020-07-10 ENCOUNTER — Encounter (HOSPITAL_COMMUNITY): Payer: Self-pay | Admitting: Emergency Medicine

## 2020-07-10 ENCOUNTER — Emergency Department (HOSPITAL_COMMUNITY)

## 2020-07-10 DIAGNOSIS — R509 Fever, unspecified: Secondary | ICD-10-CM | POA: Diagnosis present

## 2020-07-10 DIAGNOSIS — J45909 Unspecified asthma, uncomplicated: Secondary | ICD-10-CM | POA: Insufficient documentation

## 2020-07-10 DIAGNOSIS — B370 Candidal stomatitis: Secondary | ICD-10-CM | POA: Diagnosis not present

## 2020-07-10 DIAGNOSIS — J101 Influenza due to other identified influenza virus with other respiratory manifestations: Secondary | ICD-10-CM | POA: Insufficient documentation

## 2020-07-10 DIAGNOSIS — Z7951 Long term (current) use of inhaled steroids: Secondary | ICD-10-CM | POA: Diagnosis not present

## 2020-07-10 DIAGNOSIS — Z20822 Contact with and (suspected) exposure to covid-19: Secondary | ICD-10-CM | POA: Insufficient documentation

## 2020-07-10 LAB — URINALYSIS, ROUTINE W REFLEX MICROSCOPIC
Bilirubin Urine: NEGATIVE
Glucose, UA: NEGATIVE mg/dL
Hgb urine dipstick: NEGATIVE
Ketones, ur: NEGATIVE mg/dL
Leukocytes,Ua: NEGATIVE
Nitrite: NEGATIVE
Protein, ur: 100 mg/dL — AB
Specific Gravity, Urine: 1.027 (ref 1.005–1.030)
pH: 6 (ref 5.0–8.0)

## 2020-07-10 LAB — COMPREHENSIVE METABOLIC PANEL
ALT: 31 U/L (ref 0–44)
AST: 30 U/L (ref 15–41)
Albumin: 4.1 g/dL (ref 3.5–5.0)
Alkaline Phosphatase: 73 U/L (ref 38–126)
Anion gap: 8 (ref 5–15)
BUN: 6 mg/dL (ref 6–20)
CO2: 27 mmol/L (ref 22–32)
Calcium: 9.5 mg/dL (ref 8.9–10.3)
Chloride: 103 mmol/L (ref 98–111)
Creatinine, Ser: 0.8 mg/dL (ref 0.44–1.00)
GFR, Estimated: >60 mL/min (ref >60)
Glucose, Bld: 96 mg/dL (ref 70–99)
Potassium: 3.4 mmol/L — ABNORMAL LOW (ref 3.5–5.1)
Sodium: 138 mmol/L (ref 135–145)
Total Bilirubin: 0.9 mg/dL (ref 0.3–1.2)
Total Protein: 7.7 g/dL (ref 6.5–8.1)

## 2020-07-10 LAB — PREGNANCY, URINE: Preg Test, Ur: NEGATIVE

## 2020-07-10 LAB — CBC
HCT: 45.8 % (ref 36.0–46.0)
Hemoglobin: 14.7 g/dL (ref 12.0–15.0)
MCH: 26.6 pg (ref 26.0–34.0)
MCHC: 32.1 g/dL (ref 30.0–36.0)
MCV: 83 fL (ref 80.0–100.0)
Platelets: 200 10*3/uL (ref 150–400)
RBC: 5.52 MIL/uL — ABNORMAL HIGH (ref 3.87–5.11)
RDW: 14.6 % (ref 11.5–15.5)
WBC: 5.8 10*3/uL (ref 4.0–10.5)
nRBC: 0 % (ref 0.0–0.2)

## 2020-07-10 LAB — RESP PANEL BY RT-PCR (FLU A&B, COVID) ARPGX2
Influenza A by PCR: POSITIVE — AB
Influenza B by PCR: NEGATIVE
SARS Coronavirus 2 by RT PCR: NEGATIVE

## 2020-07-10 MED ORDER — ONDANSETRON 4 MG PO TBDP: 4.0000 mg | ORAL_TABLET | Freq: Three times a day (TID) | ORAL | 0 refills | Status: DC | PRN

## 2020-07-10 MED ORDER — CLOTRIMAZOLE 10 MG MT TROC: 10.0000 mg | Freq: Every day | OROMUCOSAL | 0 refills | Status: AC

## 2020-07-10 MED ORDER — SODIUM CHLORIDE 0.9 % IV BOLUS
1000.0000 mL | Freq: Once | INTRAVENOUS | Status: AC
  Administered 2020-07-10: 1000 mL via INTRAVENOUS

## 2020-07-10 MED ORDER — ACETAMINOPHEN 325 MG PO TABS
650.0000 mg | ORAL_TABLET | Freq: Once | ORAL | Status: AC
  Administered 2020-07-10: 650 mg via ORAL
  Filled 2020-07-10: qty 2

## 2020-07-10 MED ORDER — CLOTRIMAZOLE 10 MG MT TROC: 10.0000 mg | Freq: Every day | OROMUCOSAL | 0 refills | Status: DC

## 2020-07-10 MED ORDER — ONDANSETRON 4 MG PO TBDP
8.0000 mg | ORAL_TABLET | Freq: Once | ORAL | Status: AC
  Administered 2020-07-10: 8 mg via ORAL
  Filled 2020-07-10: qty 2

## 2020-07-10 MED ORDER — CLOTRIMAZOLE 10 MG MT TROC
10.0000 mg | Freq: Every day | OROMUCOSAL | Status: DC
  Administered 2020-07-10: 10 mg via ORAL
  Filled 2020-07-10 (×3): qty 1

## 2020-07-10 NOTE — ED Triage Notes (Signed)
Emergency Medicine Provider Triage Evaluation Note  Melanie Cain , a 25 y.o. female  was evaluated in triage.  Pt complains of fever, body aches, abd pain, nv, cough, generally weak for past 5 days.   Review of Systems  Positive: cough Negative: No headache  Physical Exam  BP 125/80 (BP Location: Right Arm)   Pulse 65   Temp 98 F (36.7 C) (Oral)   Resp 16   SpO2 100%  Gen:   Awake, no distress   HEENT:  Atraumatic  Resp:  Normal effort Cardiac:  Normal rate  Abd:   Nondistended, nontender    Medical Decision Making  Medically screening exam initiated at 1:23 PM.  Appropriate orders placed.  Melanie Cain was informed that the remainder of the evaluation will be completed by another provider, this initial triage assessment does not replace that evaluation, and the importance of remaining in the ED until their evaluation is complete.  Clinical Impression  Labs sent. Cxr. Will move to treatment room as soon as available.    Cathren Laine, MD 07/10/20 1324

## 2020-07-10 NOTE — ED Notes (Signed)
ED RN reviewed discharge instructions w/ pt. Pain management, prescriptions and home care reviewed. Pt had no further questions

## 2020-07-10 NOTE — ED Provider Notes (Signed)
[facility]   CSN: [id number] Arrival date & time: 07/10/20  1300     History Chief Complaint  Patient presents with  . Cough    Melanie Cain is a 25 y.o. female with a history of asthma, anemia.  Patient presents with chief complaint of flulike symptoms.  Patient reports that her symptoms started on Tuesday.  Patient states that last week both her children tested positive for influenza a.  Patient endorses fever, myalgia, generalized abdominal pain, nausea, vomiting, productive cough, fatigue, nasal congestion, rhinorrhea, headache.  T-max of 100.1 F temporal last 24 hours.  Patient endorses vomiting 1 time in the last 24 hours.  Emesis described as stomach contents, no bloody emesis or coffee-ground emesis.  Patient's abdominal pain is generalized cramping, no alleviating or aggravating factors.  Cough is productive with green to yellow mucus.  Headache intermittent over the last 5 days.  Patient reports that today her headache was gradual in onset, pain was not maximal in onset, patient has had similar headaches in the past.  Patient endorses photophobia.  No change in pain with exertion.  Patient denies any urinary symptoms, vaginal bleeding, vaginal pain, vaginal discharge.  HPI     Past Medical History:  Diagnosis Date  . Allergy   . Anemia   . Asthma   . Menometrorrhagia   . MRSA (methicillin resistant staph aureus) culture positive 2012   culture from abscess  . Vaginal delivery 07/20/2013    Patient Active Problem List   Diagnosis Date Noted  . Endometriosis 09/02/2019  . Positive GBS test 11/23/2015  . Vitamin D deficiency 11/23/2015  . Asthma 11/23/2015  . Drug allergy--prednisone 11/23/2015    Past Surgical History:  Procedure Laterality Date  . NO PAST SURGERIES       OB History    Gravida  2   Para  2   Term  2   Preterm  0   AB  0   Living  2     SAB  0   IAB  0   Ectopic  0    Multiple  0   Live Births  2           Family History  Problem Relation Age of Onset  . Asthma Other   . Cancer Other   . Asthma Mother   . Hypertension Mother   . Asthma Father   . Asthma Sister        2 sisters  . Cancer Maternal Grandmother        breast cancer  . Hearing loss Maternal Grandmother     Social History   Tobacco Use  . Smoking status: Never Smoker  . Smokeless tobacco: Never Used  Vaping Use  . Vaping Use: Never used  Substance Use Topics  . Alcohol use: No  . Drug use: No    Home Medications Prior to Admission medications   Medication Sig Start Date End Date Taking? Authorizing Provider  acetaminophen (TYLENOL) 500 MG tablet Take 500 mg by mouth every 6 (six) hours as needed for fever.     [provider]  albuterol (PROVENTIL) (2.5 MG/3ML) 0.083% nebulizer solution Take 3 mLs (2.5 mg total) by nebulization every 6 (six) hours as needed for wheezing or shortness of breath. 05/12/20   Grayce SessionsEdwards, Michelle P, NP  Cetirizine HCl 10 MG CAPS Take 1 capsule (10 mg total) by mouth daily. 03/22/20   Alfonse SpruceGallagher, Joel Louis, MD  drospirenone-ethinyl estradiol (YAZ) 3-0.02 MG tablet Take 1 tablet by mouth daily. Only take active hormonal pills in a continuous fashion 11/11/19   Anyanwu, Jethro Bastos, MD  fluticasone (FLOVENT HFA) 110 MCG/ACT inhaler Inhale 2 puffs into the lungs 2 (two) times daily. 03/22/20   Alfonse Spruce, MD  Fluticasone-Salmeterol (ADVAIR) 500-50 MCG/DOSE AEPB Inhale 2 puffs into the lungs as needed. 05/12/20   Grayce Sessions, NP  omeprazole (PRILOSEC) 20 MG capsule Take 1 capsule (20 mg total) by mouth daily. 05/12/20   Grayce Sessions, NP  pantoprazole (PROTONIX) 40 MG tablet Take 1 tablet (40 mg total) by mouth daily. 06/21/20 07/21/20  Alfonse Spruce, MD  famotidine (PEPCID) 20 MG tablet Take 1 tablet (20 mg total) by mouth 2 (two) times daily. 05/29/19 10/29/19  Cathie Hoops, Amy V, PA-C  fluticasone (FLONASE) 50 MCG/ACT nasal spray  Place 2 sprays into both nostrils daily. 10/15/15 05/18/19  Judeth Horn, NP    Allergies    Montelukast sodium, Prednisone, and Tessalon [benzonatate]  Review of Systems   Review of Systems  Constitutional: Positive for chills, fatigue and fever.  HENT: Positive for congestion, mouth sores (white on tongue) and rhinorrhea. Negative for sore throat.   Eyes: Negative for visual disturbance.  Respiratory: Positive for cough. Negative for shortness of breath.   Cardiovascular: Negative for chest pain, palpitations and leg swelling.  Gastrointestinal: Positive for abdominal pain, constipation, nausea and vomiting. Negative for abdominal distention, anal bleeding, blood in stool, diarrhea and rectal pain.  Genitourinary: Positive for decreased urine volume. Negative for difficulty urinating, dysuria, flank pain, frequency, hematuria, pelvic pain, vaginal bleeding, vaginal discharge and vaginal pain.  Musculoskeletal: Positive for myalgias. Negative for back pain and neck pain.  Skin: Negative for color change and rash.  Neurological: Positive for headaches. Negative for dizziness, tremors, seizures, syncope, facial asymmetry, speech difficulty, weakness, light-headedness and numbness.  Psychiatric/Behavioral: Negative for confusion.    Physical Exam Updated Vital Signs BP 121/85 (BP Location: Left Arm)   Pulse 72   Temp 98 F (36.7 C) (Oral)   Resp 15   SpO2 99%   Physical Exam Vitals and nursing note reviewed.  Constitutional:      General: She is not in acute distress.    Appearance: She is ill-appearing. She is not toxic-appearing or diaphoretic.  HENT:     Head: Normocephalic and atraumatic. No raccoon eyes, abrasion, contusion, masses, right periorbital erythema, left periorbital erythema or laceration.     Jaw: No trismus or pain on movement.     Mouth/Throat:     Mouth: Mucous membranes are moist.     Tongue: Lesions present.     Pharynx: Oropharynx is clear. Uvula midline.  No pharyngeal swelling, oropharyngeal exudate, posterior oropharyngeal erythema or uvula swelling.     Comments: White lesions consistent with oral thrush noted on tongue Eyes:     General: No scleral icterus.       Right eye: No discharge.        Left eye: No discharge.  Cardiovascular:     Rate and Rhythm: Normal rate.     Heart sounds: Normal heart sounds.  Pulmonary:     Effort: Pulmonary effort is normal. No tachypnea, bradypnea or respiratory distress.     Breath sounds: Normal breath sounds. No stridor.  Abdominal:     General: Abdomen is flat. Bowel sounds are normal. There is no distension. There are no signs of injury.     Palpations: Abdomen is  soft. There is no mass or pulsatile mass.     Tenderness: There is no abdominal tenderness. There is no guarding or rebound.     Hernia: There is no hernia in the umbilical area or ventral area.     Comments: jewlwery noted in umbilicus  Musculoskeletal:     Cervical back: Normal range of motion and neck supple. No edema, erythema, signs of trauma, rigidity, torticollis or crepitus. No pain with movement, spinous process tenderness or muscular tenderness. Normal range of motion.     Right lower leg: No swelling, tenderness or bony tenderness. No edema.     Left lower leg: No swelling, tenderness or bony tenderness. No edema.  Skin:    General: Skin is warm and dry.     Coloration: Skin is not jaundiced or pale.  Neurological:     General: No focal deficit present.     Mental Status: She is alert.     GCS: GCS eye subscore is 4. GCS verbal subscore is 5. GCS motor subscore is 6.     Cranial Nerves: No facial asymmetry.     Comments: Able to move all extremities equally and without difficulty  Psychiatric:        Behavior: Behavior is cooperative.     ED Results / Procedures / Treatments   Labs (all labs ordered are listed, but only abnormal results are displayed) Labs Reviewed  RESP PANEL BY RT-PCR (FLU A&B, COVID) ARPGX2 -  Abnormal; Notable for the following components:      Result Value   Influenza A by PCR POSITIVE (*)    All other components within normal limits  CBC - Abnormal; Notable for the following components:   RBC 5.52 (*)    All other components within normal limits  COMPREHENSIVE METABOLIC PANEL - Abnormal; Notable for the following components:   Potassium 3.4 (*)    All other components within normal limits  URINALYSIS, ROUTINE W REFLEX MICROSCOPIC - Abnormal; Notable for the following components:   Color, Urine AMBER (*)    APPearance HAZY (*)    Protein, ur 100 (*)    Bacteria, UA RARE (*)    All other components within normal limits  PREGNANCY, URINE    EKG EKG Interpretation  Date/Time:  Sunday Jul 10 2020 13:21:02 EDT Ventricular Rate:  82 PR Interval:  124 QRS Duration: 84 QT Interval:  388 QTC Calculation: 453 R Axis:   83 Text Interpretation: Normal sinus rhythm Normal ECG Confirmed by Cathren Laine ([id number]) on 07/10/2020 1:22:09 PM   Radiology DG Chest 2 View  Result Date: 07/10/2020 CLINICAL DATA:  Cough EXAM: CHEST - 2 VIEW COMPARISON:  07/28/2019 FINDINGS: The heart size and mediastinal contours are within normal limits. Both lungs are clear. No pleural effusion or pneumothorax. The visualized skeletal structures are unremarkable. IMPRESSION: No acute process in the chest. Electronically Signed   By: Guadlupe Spanish M.D.   On: 07/10/2020 14:42    Procedures Procedures   Medications Ordered in ED Medications  ondansetron (ZOFRAN-ODT) disintegrating tablet 8 mg (8 mg Oral Given 07/10/20 1644)  sodium chloride 0.9 % bolus 1,000 mL (0 mLs Intravenous Stopped 07/10/20 1833)  acetaminophen (TYLENOL) tablet 650 mg (650 mg Oral Given 07/10/20 1654)    ED Course  I have reviewed the triage vital signs and the nursing notes.  Pertinent labs & imaging results that were available during my care of the patient were reviewed by me and considered in my medical decision making (  see  chart for details).    MDM Rules/Calculators/A&P                          Ill-appearing 25 year old female no acute stress, nontoxic-appearing.  Patient presents with chief complaint of flulike symptoms.  Patient symptoms began on Tuesday.  Patient recently exposed to influenza from her children.    Patient is hemodynamically stable.  Lungs clear to auscultation bilaterally.  Abdomen soft, nondistended, nontender, no rebound tenderness, no guarding, no mass.  Patient able to move all limbs equally without difficulty.  Patient has no facial asymmetry.  UA, pregnancy test, CMP, CBC, respiratory panel, chest x-ray, EKG obtained while patient was in triage.  Low suspicion for subarachnoid hemorrhage causing patient's headache.  Headache is likely secondary to influenza. Low suspicion for intra-abdominal infection as patient has no leukocytosis and benign abdomen on exam.  Abdominal discomfort nausea, vomiting are likely due to influenza. Chest x-ray shows no acute cardiopulmonary disease. Urinalysis shows bacteria rare, leukocytes negative, nitrite negative, protein 100.  Low suspicion for UTI as patient has no urinary symptoms. Urine pregnancy test negative.  Influenza A positive.  Patient symptoms are likely due to sequelae from influenza. Give patient 1 L fluid bolus, Zofran, Tylenol for her flulike symptoms.  Plan to discharge patient on Zofran, Tessalon, and have her follow-up with primary care provider if symptoms do not improve.  She was noted to have oral thrush.  Patient has allergy to nystatin.  Will prescribe patient on clotrimazole troches.  Serial repeat examination patient reports improvement in her symptoms after receiving a bolus.  Patient hemodynamically stable at this time.  Will discharge patient.  Discussed results, findings, treatment and follow up. Patient advised of return precautions. Patient verbalized understanding and agreed with plan.   Final Clinical Impression(s) /  ED Diagnoses Final diagnoses:  Influenza A  Oral thrush    Rx / DC Orders ED Discharge Orders         Ordered    ondansetron (ZOFRAN ODT) 4 MG disintegrating tablet  Every 8 hours PRN,   Status:  Discontinued        07/10/20 1812    clotrimazole (MYCELEX) 10 MG troche  5 times daily,   Status:  Discontinued        07/10/20 1812    ondansetron (ZOFRAN ODT) 4 MG disintegrating tablet  Every 8 hours PRN        07/10/20 1839    clotrimazole (MYCELEX) 10 MG troche  5 times daily        07/10/20 1839           Berneice Heinrich 07/11/20 0336    Benjiman Core, MD 07/12/20 [phone]

## 2020-07-10 NOTE — Discharge Instructions (Addendum)
Please isolate until 24 hours after you are fever-free without the help of medications (Tylenol/acetaminophen and Advil/ibuprofen/Motrin) AND your symptoms are improving.  You can alternate Tylenol/acetaminophen and Advil/ibuprofen/Motrin every 4 hours for sore throat, body aches, headache or fever.  Drink plenty of water.   You were also found to have oral thrush.  I have given you clotrimazole.  Please let 1 tablet dissolve in your mouth 5 times daily.  Please do this for neck 7 days.  If your symptoms do not improve please follow-up with your primary care provider. Use saline nasal spray for congestion. You can take Zofran every 8 hours as needed for nausea and vomiting. Wash your hands frequently. Please rest as needed with frequent repositioning and ambulation as tolerated.   If your symptoms do not improve please follow-up with your primary care provider or urgent care.  Return to the ER for significant shortness of breath, uncontrollable vomiting, severe chest pain, inability to tolerate fluids, changes in mental status such as confusion or other concerning symptoms.

## 2020-07-10 NOTE — ED Triage Notes (Signed)
C/o productive cough with green phlegm, fever, vomiting, body aches, runny nose, and chills since Tuesday.

## 2020-07-12 ENCOUNTER — Ambulatory Visit (INDEPENDENT_AMBULATORY_CARE_PROVIDER_SITE_OTHER): Admitting: Clinical

## 2020-07-12 ENCOUNTER — Other Ambulatory Visit: Payer: Self-pay

## 2020-07-12 DIAGNOSIS — F32A Depression, unspecified: Secondary | ICD-10-CM | POA: Diagnosis not present

## 2020-07-12 DIAGNOSIS — F419 Anxiety disorder, unspecified: Secondary | ICD-10-CM | POA: Diagnosis not present
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
## 2020-07-28 ENCOUNTER — Ambulatory Visit (INDEPENDENT_AMBULATORY_CARE_PROVIDER_SITE_OTHER): Admitting: Clinical

## 2020-07-28 ENCOUNTER — Other Ambulatory Visit: Payer: Self-pay

## 2020-07-28 DIAGNOSIS — F419 Anxiety disorder, unspecified: Secondary | ICD-10-CM

## 2020-07-28 DIAGNOSIS — F32A Depression, unspecified: Secondary | ICD-10-CM

## 2020-07-28 NOTE — Progress Notes (Signed)
   THERAPIST PROGRESS NOTE  Session Time: 10am  Participation Level: Active  Behavioral Response: Casual and NeatAlertEuthymic  Type of Therapy: Individual Therapy  Treatment Goals addressed: Communication:   Interventions: Assertiveness Training and DBT  Summary: Melanie Cain is a 25 y.o. female who presents in a euthymic mood. Pt describes mood as "happy and excited" Pt reports work is her greatest stressor. Pt says when she becomes triggered she displays verbal aggression. Pt demonstrates some insight as she identifies how stress can affect preexisting health complications she has. Additionally pt discussed having strained relationship with partner at times. Pt states partner is not as helpful with household responsibilities as she would like. Pt also states when triggered by partner she displays verbal aggression. Per pt report she has had multiple days where she has journaled and been outside in nature. Pt making some progress toward achieving treatment goal.  Suicidal/Homicidal:   Therapist Response: CSW  Assessed for changes in mood and behavior. CSW provided pt with information on DBT emotion regulation skills(opposite action) to assist with changing emotion. Actively listened as pt discussed responding with verbal aggression to distressing situations. Processed with pt assertiveness techniques to express feelings in healthy manner. Pt encouraged to practice interventions and will be further discussed next session.  Plan: Return again in 2 weeks.  Diagnosis: Axis I: Anxiety and depression    Axis II: No diagnosis    Suzan Slick, LCSW 07/28/2020

## 2020-08-11 ENCOUNTER — Ambulatory Visit (INDEPENDENT_AMBULATORY_CARE_PROVIDER_SITE_OTHER): Admitting: Clinical

## 2020-08-11 ENCOUNTER — Other Ambulatory Visit: Payer: Self-pay

## 2020-08-11 DIAGNOSIS — F32A Depression, unspecified: Secondary | ICD-10-CM | POA: Diagnosis not present

## 2020-08-11 DIAGNOSIS — F419 Anxiety disorder, unspecified: Secondary | ICD-10-CM | POA: Diagnosis not present

## 2020-08-11 NOTE — Progress Notes (Signed)
   THERAPIST PROGRESS NOTE  Session Time: 11am  Participation Level: Active  Behavioral Response: Casual and NeatAlertEuthymic  Type of Therapy: Individual Therapy  Treatment Goals addressed: Coping  Interventions: CBT  Summary: Melanie Cain is a 25 y.o. female who presents in a euthymic mood. Pt reports no change in mood and behavior. Pt says she is being considered for a promotion on her job but is fearful of taking on new responsibilities. Pt reports she has been recommended for a management position. Additionally, pt appeared to be proud of herself as she discussed having no days of unhealthy communication with others. Pt reports she continues to work on ways to assert when interacting with others. Pt says she has found using opposite action and assertive techniques previously discussed beneficial.  Suicidal/Homicidal: Pt denies a plan or intent to harm herself or others.  Therapist Response: CSW assisted pt in brainstorming pros and cons of management position. CSW provided pt with information on socratic questions to challenge irrational beliefs prompted by anxiety producing situations. CSW verbally praised pt for progress toward displaying healthy dialogue with others.   Plan: Return again in 2 weeks.  Diagnosis: Axis I: Anxiety and depression    Axis II: No diagnosis    Suzan Slick, LCSW 08/11/2020

## 2020-09-01 ENCOUNTER — Ambulatory Visit (INDEPENDENT_AMBULATORY_CARE_PROVIDER_SITE_OTHER): Payer: Self-pay | Admitting: Clinical

## 2020-09-01 ENCOUNTER — Other Ambulatory Visit: Payer: Self-pay

## 2020-09-01 DIAGNOSIS — F32A Depression, unspecified: Secondary | ICD-10-CM

## 2020-09-01 DIAGNOSIS — F419 Anxiety disorder, unspecified: Secondary | ICD-10-CM

## 2020-09-01 NOTE — Progress Notes (Signed)
   THERAPIST PROGRESS NOTE  Session Time: 11am  Participation Level: Active  Behavioral Response: CasualAlertEuthymic  Type of Therapy: Individual Therapy  Treatment Goals addressed: Anger  Interventions: Supportive  Summary: Melanie Cain is a 25 y.o. female who reports accepting the promotion at her job. 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 Pt identifies modeling as something she enjoys. Pt unable to identify anything that is draining her, stating "I dont let things bother me, I just it is what it is" Pt states she does not like being told what to do and states this is a trigger for her.  Suicidal/Homicidal: Pt denies SI/HI no plan or intent to harm self or others reported.  Therapist Response: CSW assessed for changes in mood and behavior. CSW congratulated pt on her promotion at work. CSW prompted pt to identify things that energize and drain her. CSW reviewed and provided pt with anger management techniques and probed for anger triggers. CSW prompted pt to identify consequences of displaying aggressive behavior. CSW actively listened as pt discussed how emotions were displayed in her family and habits she adopted from her childhood.  Plan: Return again in 2 weeks.  Diagnosis: Axis I: Anxiety and depression    Axis II: No diagnosis    Suzan Slick, LCSW 09/01/2020

## 2020-09-15 ENCOUNTER — Ambulatory Visit (HOSPITAL_COMMUNITY): Admitting: Clinical

## 2020-09-22 ENCOUNTER — Ambulatory Visit (INDEPENDENT_AMBULATORY_CARE_PROVIDER_SITE_OTHER): Payer: Self-pay | Admitting: Clinical

## 2020-09-22 ENCOUNTER — Other Ambulatory Visit: Payer: Self-pay

## 2020-09-22 DIAGNOSIS — F32A Depression, unspecified: Secondary | ICD-10-CM

## 2020-09-22 DIAGNOSIS — F419 Anxiety disorder, unspecified: Secondary | ICD-10-CM

## 2020-09-22 NOTE — Progress Notes (Signed)
   THERAPIST PROGRESS NOTE  Session Time: 11am  Participation Level: Active  Behavioral Response: Casual and NeatAlertEuthymic  Type of Therapy: Individual Therapy  Treatment Goals addressed: Coping  Interventions: Strength-based  Summary: Melanie Cain is a 25 y.o. female who presents in euthymic mood. Pt reports work is going well and she recently modeled in a fashion show. 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 Pt states she works to have a positive outlook on life and says her philosophy on life is "it is what it is" When asked what this means to her, pt reports "I dont stop living because something wont change" Pt demonstrates good insight and strengths she says that have gone unnoticed.  Suicidal/Homicidal: Pt denies SI/HI no plan or intent to harm self or others reported.  Therapist Response: CSW assessed for changes in mood and behavior. CSW provided pt with information on radical acceptance. CSW explained how learning to accept things that are out of her control may lead to less anger, anxiety, etc. CSW modeled for pt examples of typical thinking about a situation vs radical acceptance and assessed her understanding of subject. CSW highlighted some of pt strengths that may go unnoticed.  Plan: Return again in 2 weeks.  Diagnosis: Axis I: Anxiety and depression      Axis II: No diagnosis    Suzan Slick, LCSW 09/22/2020

## 2020-09-27 ENCOUNTER — Other Ambulatory Visit: Payer: Self-pay

## 2020-09-27 ENCOUNTER — Ambulatory Visit: Admitting: Allergy & Immunology

## 2020-10-06 ENCOUNTER — Other Ambulatory Visit: Payer: Self-pay

## 2020-10-06 ENCOUNTER — Ambulatory Visit (INDEPENDENT_AMBULATORY_CARE_PROVIDER_SITE_OTHER): Admitting: Clinical

## 2020-10-06 DIAGNOSIS — F419 Anxiety disorder, unspecified: Secondary | ICD-10-CM | POA: Diagnosis not present

## 2020-10-06 DIAGNOSIS — F32A Depression, unspecified: Secondary | ICD-10-CM

## 2020-10-06 NOTE — Progress Notes (Signed)
   THERAPIST PROGRESS NOTE  Session Time: 11am  Participation Level: Active  Behavioral Response: Casual and NeatAlertEuthymic  Type of Therapy: Individual Therapy  Treatment Goals addressed: Coping  Interventions: Other: psychoeducation  Summary: Melanie Cain is a 25 y.o. female who presents in a euthymic mood. Pt appeared to be proud of herself as she discussed applying dbt technique(opposite action) to challenging situations she was involved in at work. Pt reports she managed challenging customers at work in a professional manner. Pt says her coworkers commented on her professional demeanor and appropriate manner in which she handled both situations. Pt states she plans to continue working on on managing her anger in a healthy manner. Pt discussed becoming anxious when she feels as if others are leaving or abandoning her or when she is in public places alone.   Suicidal/Homicidal: Pt denies SI/HI no plan or intent to harm self or others reported.   Therapist Response: CSW assessed for changes in mood and behavior. CSW provided pt with information on cycle of anxiety. CSW discussed with pt how avoidance can limit her quality of life and ability to achieve her goals. CSW probed for feedback on root of anxiety. CSW discussed with pt utilizing exposure therapy to help with facing fear in a gradual and methodical way. CSW assessed for pt fear hierarchy of being in public spaces alone.  Plan: Return again in 2 weeks.  Diagnosis: Axis I: Anxiety and depression    Axis II: No diagnosis    Suzan Slick, LCSW 10/06/2020

## 2020-10-13 ENCOUNTER — Other Ambulatory Visit: Payer: Self-pay

## 2020-10-13 ENCOUNTER — Ambulatory Visit
Admission: RE | Admit: 2020-10-13 | Discharge: 2020-10-13 | Disposition: A | Discharge status: Home / Self Care | Source: Ambulatory Visit

## 2020-10-13 VITALS — BP 123/72 | HR 65 | Temp 98.2°F | Resp 18

## 2020-10-13 DIAGNOSIS — J45909 Unspecified asthma, uncomplicated: Secondary | ICD-10-CM

## 2020-10-13 DIAGNOSIS — J4521 Mild intermittent asthma with (acute) exacerbation: Secondary | ICD-10-CM | POA: Diagnosis not present

## 2020-10-13 MED ORDER — FLUTICASONE-SALMETEROL 500-50 MCG/DOSE IN AEPB: 2.0000 | INHALATION_SPRAY | RESPIRATORY_TRACT | 0 refills | Status: DC | PRN

## 2020-10-13 NOTE — ED Provider Notes (Signed)
[name]    CSN: [id number] Arrival date & time: 10/13/20  1602      History   Chief Complaint Chief Complaint  Patient presents with   appointment @4p    Cough   Nasal Congestion    HPI Melanie Cain is a 25 y.o. female.   Patient presenting today with nearly a week of runny nose, cough, sinus headache, wheezing, chest tightness and more frequent asthma attacks the last 3 days.  Denies fever, chills, body aches, chest pain, asthma symptoms unrelieved by albuterol inhaler and nebulizer treatments.  Taking Sudafed, Benadryl, Mucinex, albuterol nebulizer and inhaler with mild temporary relief.  No known sick contacts.  History of seasonal allergies and asthma.   Past Medical History:  Diagnosis Date   Allergy    Anemia    Asthma    Menometrorrhagia    MRSA (methicillin resistant staph aureus) culture positive 2012   culture from abscess   Vaginal delivery 07/20/2013    Patient Active Problem List   Diagnosis Date Noted   Endometriosis 09/02/2019   Positive GBS test 11/23/2015   Vitamin D deficiency 11/23/2015   Asthma 11/23/2015   Drug allergy--prednisone 11/23/2015    Past Surgical History:  Procedure Laterality Date   NO PAST SURGERIES     WISDOM TOOTH EXTRACTION Bilateral     OB History     Gravida  2   Para  2   Term  2   Preterm  0   AB  0   Living  2      SAB  0   IAB  0   Ectopic  0   Multiple  0   Live Births  2            Home Medications    Prior to Admission medications   Medication Sig Start Date End Date Taking? Authorizing Provider  acetaminophen (TYLENOL) 500 MG tablet Take 500 mg by mouth every 6 (six) hours as needed for fever.     [provider]  albuterol (PROVENTIL) (2.5 MG/3ML) 0.083% nebulizer solution Take 3 mLs (2.5 mg total) by nebulization every 6 (six) hours as needed for wheezing or shortness of breath. 05/12/20   07/12/20, NP  Cetirizine HCl 10 MG CAPS Take 1  capsule (10 mg total) by mouth daily. 03/22/20   05/20/20, MD  drospirenone-ethinyl estradiol (YAZ) 3-0.02 MG tablet Take 1 tablet by mouth daily. Only take active hormonal pills in a continuous fashion 11/11/19   Anyanwu, Ugonna A, MD  EPINEPHrine 0.3 mg/0.3 mL IJ SOAJ injection Inject into the muscle. 06/21/20   [provider]  fluticasone (FLOVENT HFA) 110 MCG/ACT inhaler Inhale 2 puffs into the lungs 2 (two) times daily. 03/22/20   05/20/20, MD  Fluticasone-Salmeterol (ADVAIR) 500-50 MCG/DOSE AEPB Inhale 2 puffs into the lungs as needed. 10/13/20   12/13/20, PA-C  omeprazole (PRILOSEC) 20 MG capsule Take 1 capsule (20 mg total) by mouth daily. 05/12/20   07/12/20, NP  ondansetron (ZOFRAN ODT) 4 MG disintegrating tablet Take 1 tablet (4 mg total) by mouth every 8 (eight) hours as needed for nausea or vomiting. 07/10/20   09/09/20, PA-C  pantoprazole (PROTONIX) 40 MG tablet Take 1 tablet (40 mg total) by mouth daily. 06/21/20 07/21/20  09/20/20, MD  famotidine (PEPCID) 20 MG tablet Take 1 tablet (20 mg total) by mouth 2 (two) times daily. 05/29/19 10/29/19  10/31/19,  Amy V, PA-C  fluticasone (FLONASE) 50 MCG/ACT nasal spray Place 2 sprays into both nostrils daily. 10/15/15 05/18/19  Judeth Horn, NP    Family History Family History  Problem Relation Age of Onset   Asthma Other    Cancer Other    Asthma Mother    Hypertension Mother    Asthma Father    Asthma Sister        2 sisters   Cancer Maternal Grandmother        breast cancer   Hearing loss Maternal Grandmother     Social History Social History   Tobacco Use   Smoking status: Never   Smokeless tobacco: Never  Vaping Use   Vaping Use: Never used  Substance Use Topics   Alcohol use: No   Drug use: No     Allergies   Montelukast sodium, Prednisone, and Tessalon [benzonatate]   Review of Systems Review of Systems Per HPI  Physical Exam Triage Vital  Signs ED Triage Vitals  Enc Vitals Group     BP 10/13/20 1638 123/72     Pulse Rate 10/13/20 1638 65     Resp 10/13/20 1638 18     Temp 10/13/20 1638 98.2 F (36.8 C)     Temp Source 10/13/20 1638 Oral     SpO2 10/13/20 1638 98 %     Weight --      Height --      Head Circumference --      Peak Flow --      Pain Score 10/13/20 1639 7     Pain Loc --      Pain Edu? --      Excl. in [place]? --    No data found.  Updated Vital Signs BP 123/72 (BP Location: Left Arm)   Pulse 65   Temp 98.2 F (36.8 C) (Oral)   Resp 18   SpO2 98%   Visual Acuity Right Eye Distance:   Left Eye Distance:   Bilateral Distance:    Right Eye Near:   Left Eye Near:    Bilateral Near:     Physical Exam Vitals and nursing note reviewed.  Constitutional:      Appearance: Normal appearance. She is not ill-appearing.  HENT:     Head: Atraumatic.     Right Ear: Tympanic membrane normal.     Left Ear: Tympanic membrane normal.     Nose: Rhinorrhea present.     Mouth/Throat:     Mouth: Mucous membranes are moist.     Pharynx: Posterior oropharyngeal erythema present. No oropharyngeal exudate.  Eyes:     Extraocular Movements: Extraocular movements intact.     Conjunctiva/sclera: Conjunctivae normal.  Cardiovascular:     Rate and Rhythm: Normal rate and regular rhythm.     Heart sounds: Normal heart sounds.  Pulmonary:     Effort: Pulmonary effort is normal. No respiratory distress.     Breath sounds: Wheezing present.     Comments: Moderate scattered expiratory wheezes Abdominal:     General: Bowel sounds are normal. There is no distension.     Palpations: Abdomen is soft.     Tenderness: There is no abdominal tenderness. There is no guarding.  Musculoskeletal:        General: Normal range of motion.     Cervical back: Normal range of motion and neck supple.  Skin:    General: Skin is warm and dry.  Neurological:     Mental Status: She  is alert and oriented to person, place, and time.   Psychiatric:        Mood and Affect: Mood normal.        Thought Content: Thought content normal.        Judgment: Judgment normal.     UC Treatments / Results  Labs (all labs ordered are listed, but only abnormal results are displayed) Labs Reviewed  NOVEL CORONAVIRUS, NAA    EKG   Radiology No results found.  Procedures Procedures (including critical care time)  Medications Ordered in UC Medications - No data to display  Initial Impression / Assessment and Plan / UC Course  I have reviewed the triage vital signs and the nursing notes.  Pertinent labs & imaging results that were available during my care of the patient were reviewed by me and considered in my medical decision making (see chart for details).     Suspect viral URI causing an asthma exacerbation.  Unfortunately, she is allergic to oral prednisone but does tolerate steroid inhalers.  Will send Advair to use in addition to her albuterol inhaler and nebulizer treatments.  Continue allergy regimen, Sudafed, Mucinex.  Also allergic to Tessalon, Phenergan DM ingredients will avoid cough suppressants.  COVID PCR pending, quarantine reviewed with patient.  Return for acutely worsening symptoms.  Final Clinical Impressions(s) / UC Diagnoses   Final diagnoses:  Mild intermittent asthma with acute exacerbation  Uncomplicated asthma, unspecified asthma severity, unspecified whether persistent   Discharge Instructions   None    ED Prescriptions     Medication Sig Dispense Auth. Provider   Fluticasone-Salmeterol (ADVAIR) 500-50 MCG/DOSE AEPB Inhale 2 puffs into the lungs as needed. 60 each Particia Nearing, [place]      PDMP not reviewed this encounter.   Particia Nearing, [place] 10/13/20 1715

## 2020-10-13 NOTE — ED Triage Notes (Signed)
6 day h/o rhinorrhea, cough, and sinus HA that has worsened within the last 4 days. Pt reports two asthma attacks within the last 3 days. Pt used inhaler without relief. Also been taking sudafed and benadryl with some temporary relief.

## 2020-10-14 LAB — SARS-COV-2, NAA 2 DAY TAT

## 2020-10-14 LAB — NOVEL CORONAVIRUS, NAA: SARS-CoV-2, NAA: NOT DETECTED

## 2020-10-20 ENCOUNTER — Other Ambulatory Visit: Payer: Self-pay

## 2020-10-20 ENCOUNTER — Ambulatory Visit (INDEPENDENT_AMBULATORY_CARE_PROVIDER_SITE_OTHER): Admitting: Clinical

## 2020-10-20 DIAGNOSIS — F32A Depression, unspecified: Secondary | ICD-10-CM | POA: Diagnosis not present

## 2020-10-20 DIAGNOSIS — F419 Anxiety disorder, unspecified: Secondary | ICD-10-CM

## 2020-10-20 NOTE — Progress Notes (Signed)
   THERAPIST PROGRESS NOTE  Session Time: 11am  Participation Level: Active  Behavioral Response: Casual and NeatAlert"annoyed"  Type of Therapy: Individual Therapy  Treatment Goals addressed: Anger and Coping  Interventions: DBT  Summary: Melanie Cain is a 25 y.o. female who describes her mood as "annoyed".  Pt discussed recent loss of two friendships due to verbal disagreements. Pt describes herself as being "petty and having to have final word." Pt states she realizes she needs to improve upon this and displayed this behavior during disagreements with her friends. Pt completed self care assessment and reports she needs to improve upon her emotional health.  Suicidal/Homicidal: Pt denies SI/HI no plan, intent or attempt to harm self or others reported.  Therapist Response: CSW processed with pt emotion regulation skills to assist in managing her anger. CSW reviewed with pt performing opposite action when becoming upset. CSW praised pt for implementing opposite action and prompted her to identify if she could have done anything differently to manage disagreements with friends. CSW reviewed with pt self care assessment.  Plan: Return again in 2 weeks.  Diagnosis: Axis I:  Anxiety and depression     Axis II: No diagnosis    Suzan Slick, LCSW 10/20/2020

## 2020-11-03 ENCOUNTER — Other Ambulatory Visit: Payer: Self-pay

## 2020-11-03 ENCOUNTER — Ambulatory Visit (INDEPENDENT_AMBULATORY_CARE_PROVIDER_SITE_OTHER): Admitting: Clinical

## 2020-11-03 DIAGNOSIS — F32A Depression, unspecified: Secondary | ICD-10-CM

## 2020-11-03 DIAGNOSIS — F419 Anxiety disorder, unspecified: Secondary | ICD-10-CM

## 2020-11-03 NOTE — Progress Notes (Signed)
   THERAPIST PROGRESS NOTE  Session Time: 11am  Participation Level: Active  Behavioral Response: Casual and NeatAlertEuthymic  Type of Therapy: Individual Therapy  Treatment Goals addressed: Coping  Interventions: Supportive  Summary: Melanie Cain is a 25 y.o. female who presents with euthymic mood. Pt reports her children have returned to school and hopeful they will have a nice school year. Pt discussed challenges with managing her anxiety. Pt describes herself as introvert but states customers and staff enjoy when she is around. Pt report having social anxiety and avoiding large crowds of people. Pt reports when she gets angry or feels overwhelmed at work she will sit in her car, remove herself from the setting or go for a walk to calm herself. Pt states working a job that allows flexibility to move around as often as she needs to.  Suicidal/Homicidal: Pt denies SI/HI no plan, intent or attempt to harm self or others reported.  Therapist Response: CSW assessed for changes in mood and behavior. CSW verbally praised pt for progress she is making with managing her anger. CSW reviewed with pt opposite action to change emotion. CSW brainstormed with pt anger management strategies to manage emotion at home and work. CSW processed with pt ways to manage anxiety in a healthy manner.  Plan: Return again in 2 weeks.  Diagnosis: Axis I: Anxiety and depression    Axis II: No diagnosis    Suzan Slick, LCSW 11/03/2020

## 2020-11-10 ENCOUNTER — Ambulatory Visit: Admitting: Allergy & Immunology

## 2020-11-17 ENCOUNTER — Ambulatory Visit (HOSPITAL_COMMUNITY): Admitting: Clinical

## 2020-11-25 ENCOUNTER — Telehealth: Payer: Self-pay

## 2020-11-25 DIAGNOSIS — N809 Endometriosis, unspecified: Secondary | ICD-10-CM

## 2020-11-25 NOTE — Telephone Encounter (Signed)
Pt request a refill on BCP's.    Addison Naegeli, RN  11/25/20

## 2020-11-28 MED ORDER — DROSPIRENONE-ETHINYL ESTRADIOL 3-0.02 MG PO TABS: 1.0000 | ORAL_TABLET | Freq: Every day | ORAL | 1 refills | Status: DC

## 2020-11-28 NOTE — Telephone Encounter (Addendum)
Called patient in regards to regards to refill on OCP's. Patient reports the [facility] has been working pretty well. She reports in August and September she has been bleeding for longer periods than she is used to.   She would like a follow up appointment, she has spoken with the front desk and was informed she needs to call back in [facility] October.   OCP's re prescribed until patient can be seen in the office.   Message to front office to call patient to get her scheduled for follow up appointment with Dr. Macon Large.

## 2020-11-28 NOTE — Addendum Note (Signed)
Addended by: Ed Blalock on: 11/28/2020 02:59 PM   Modules accepted: Orders

## 2020-12-01 ENCOUNTER — Other Ambulatory Visit: Payer: Self-pay

## 2020-12-01 ENCOUNTER — Ambulatory Visit (INDEPENDENT_AMBULATORY_CARE_PROVIDER_SITE_OTHER): Admitting: Clinical

## 2020-12-01 DIAGNOSIS — F419 Anxiety disorder, unspecified: Secondary | ICD-10-CM | POA: Diagnosis not present

## 2020-12-01 DIAGNOSIS — F32A Depression, unspecified: Secondary | ICD-10-CM | POA: Diagnosis not present

## 2020-12-01 NOTE — Progress Notes (Signed)
   THERAPIST PROGRESS NOTE  Session Time: 9am  Participation Level: Active  Behavioral Response: CasualAlert"stressed, calm, anxious"  Type of Therapy: Individual Therapy  Treatment Goals addressed: Anxiety and Coping  Interventions: CBT and Supportive  Summary: Pt reports she is trying to find a work life balance and discussed challenges she has been experiencing in doing so. Pt admits she has not been very attentive to her self care needs, stating "I take care of everyone else" Pt acknowledges the importance of prioritizing her needs to improve her overall health and wellness. Pt discussed physical health problem such as chronic asthma, seasonal allergies, anemia and chest pains that have taken a toll on her physical and emotional health. Pt says she plans to go to urgent care to address needs.  Suicidal/Homicidal: Pt denies SI/HI no plan, intent or attempt to harm self or others reported.  Therapist Response: CSW reviewed and discussed self care assessement completed in previous session. CSW processed with pt self care tips and ways she can be intentional and consistent with her self care routine. CSW discussed with pt the role that social supports can play in improving mood.  Plan: Return again in 2 weeks.  Diagnosis: Axis I: Anxiety and depression    Axis II: No diagnosis    Suzan Slick, LCSW 12/01/2020

## 2020-12-15 ENCOUNTER — Ambulatory Visit (INDEPENDENT_AMBULATORY_CARE_PROVIDER_SITE_OTHER): Admitting: Clinical

## 2020-12-15 ENCOUNTER — Other Ambulatory Visit: Payer: Self-pay

## 2020-12-15 DIAGNOSIS — F32A Depression, unspecified: Secondary | ICD-10-CM

## 2020-12-15 DIAGNOSIS — F419 Anxiety disorder, unspecified: Secondary | ICD-10-CM | POA: Diagnosis not present

## 2020-12-15 NOTE — Progress Notes (Signed)
   THERAPIST PROGRESS NOTE  Session Time: 11am  Participation Level: Active  Behavioral Response: NeatAlert"annoyed"  Type of Therapy: Individual Therapy  Treatment Goals addressed: Coping  Interventions: Other: psychoeducation  Summary:  Pt describes her mood as "annoyed" today, difficult engaging in todays session. Pt says she is grieving the loss of her significant others cousin. Pt reports she is still considering leaving her job and reports a lack of support at her place of employment. Pt discussed when she feels attacked at work she responds with aggressive communication. Pt demonstrated limited insight when discussing consequences she has received and could potentially receive displaying aggressive communication. Additionally, pt demonstrated impulsive responses not relevant to questions asked.  Suicidal/Homicidal: Pt denies SI/HI no plan, intent or attempt to harm self or others reported.  Therapist Response: Todays session consisted of reviewing and providing pt with information on passive, assertive and aggressive communication styles. Some redirection required during session  Plan: Return again in 2 weeks.  Diagnosis: Axis I: Anxiety and depression    Axis II: No diagnosis    Suzan Slick, LCSW 12/15/2020

## 2020-12-28 ENCOUNTER — Ambulatory Visit (HOSPITAL_COMMUNITY): Admitting: Clinical

## 2020-12-28 ENCOUNTER — Other Ambulatory Visit: Payer: Self-pay

## 2021-01-13 ENCOUNTER — Encounter: Payer: Self-pay | Admitting: Emergency Medicine

## 2021-01-13 ENCOUNTER — Other Ambulatory Visit: Payer: Self-pay

## 2021-01-13 ENCOUNTER — Emergency Department (INDEPENDENT_AMBULATORY_CARE_PROVIDER_SITE_OTHER)
Admission: EM | Admit: 2021-01-13 | Discharge: 2021-01-13 | Disposition: A | Discharge status: Home / Self Care | Source: Home / Self Care

## 2021-01-13 DIAGNOSIS — N3001 Acute cystitis with hematuria: Secondary | ICD-10-CM | POA: Diagnosis not present

## 2021-01-13 LAB — POCT URINALYSIS DIP (MANUAL ENTRY)
Bilirubin, UA: NEGATIVE
Glucose, UA: NEGATIVE mg/dL
Ketones, POC UA: NEGATIVE mg/dL
Nitrite, UA: NEGATIVE
Protein Ur, POC: 30 mg/dL — AB
Spec Grav, UA: 1.02 (ref 1.010–1.025)
Urobilinogen, UA: 0.2 E.U./dL
pH, UA: 6 (ref 5.0–8.0)

## 2021-01-13 MED ORDER — NITROFURANTOIN MONOHYD MACRO 100 MG PO CAPS: 100.0000 mg | ORAL_CAPSULE | Freq: Two times a day (BID) | ORAL | 0 refills | Status: AC

## 2021-01-13 NOTE — ED Triage Notes (Signed)
Dysuria w/ periods GERD symptoms every am -on pantoprazole, takes at night  Has a GYN appointment on 111/23/22 Has a new PCP - no GI referral

## 2021-01-13 NOTE — ED Provider Notes (Signed)
[facility]    CSN: [id number] Arrival date & time: 01/13/21  1105      History   Chief Complaint Chief Complaint  Patient presents with   Dysuria   Gastroesophageal Reflux    HPI Melanie Cain is a 25 y.o. female.   HPI 26 year old female presents with dysuria for 2 to 3 days.  Past Medical History:  Diagnosis Date   Allergy    Anemia    Asthma    Menometrorrhagia    MRSA (methicillin resistant staph aureus) culture positive 2012   culture from abscess   Vaginal delivery 07/20/2013    Patient Active Problem List   Diagnosis Date Noted   Endometriosis 09/02/2019   Positive GBS test 11/23/2015   Vitamin D deficiency 11/23/2015   Asthma 11/23/2015   Drug allergy--prednisone 11/23/2015    Past Surgical History:  Procedure Laterality Date   NO PAST SURGERIES     WISDOM TOOTH EXTRACTION Bilateral     OB History     Gravida  2   Para  2   Term  2   Preterm  0   AB  0   Living  2      SAB  0   IAB  0   Ectopic  0   Multiple  0   Live Births  2            Home Medications    Prior to Admission medications   Medication Sig Start Date End Date Taking? Authorizing Provider  nitrofurantoin, macrocrystal-monohydrate, (MACROBID) 100 MG capsule Take 1 capsule (100 mg total) by mouth 2 (two) times daily for 7 days. 01/13/21 01/20/21 Yes Trevor Iha, FNP  acetaminophen (TYLENOL) 500 MG tablet Take 500 mg by mouth every 6 (six) hours as needed for fever.     [provider]  albuterol (PROVENTIL) (2.5 MG/3ML) 0.083% nebulizer solution Take 3 mLs (2.5 mg total) by nebulization every 6 (six) hours as needed for wheezing or shortness of breath. 05/12/20   Grayce Sessions, NP  Cetirizine HCl 10 MG CAPS Take 1 capsule (10 mg total) by mouth daily. 03/22/20   Alfonse Spruce, MD  drospirenone-ethinyl estradiol (YAZ) 3-0.02 MG tablet Take 1 tablet by mouth daily. Only take active hormonal pills in a continuous fashion  11/28/20   Federico Flake, MD  EPINEPHrine 0.3 mg/0.3 mL IJ SOAJ injection Inject into the muscle. 06/21/20   [provider]  fluticasone (FLOVENT HFA) 110 MCG/ACT inhaler Inhale 2 puffs into the lungs 2 (two) times daily. 03/22/20   Alfonse Spruce, MD  Fluticasone-Salmeterol (ADVAIR) 500-50 MCG/DOSE AEPB Inhale 2 puffs into the lungs as needed. 10/13/20   Particia Nearing, PA-C  omeprazole (PRILOSEC) 20 MG capsule Take 1 capsule (20 mg total) by mouth daily. Patient not taking: Reported on 01/13/2021 05/12/20   Grayce Sessions, NP  ondansetron (ZOFRAN ODT) 4 MG disintegrating tablet Take 1 tablet (4 mg total) by mouth every 8 (eight) hours as needed for nausea or vomiting. Patient not taking: Reported on 01/13/2021 07/10/20   Haskel Schroeder, PA-C  pantoprazole (PROTONIX) 40 MG tablet Take 1 tablet (40 mg total) by mouth daily. 06/21/20 07/21/20  Alfonse Spruce, MD  famotidine (PEPCID) 20 MG tablet Take 1 tablet (20 mg total) by mouth 2 (two) times daily. 05/29/19 10/29/19  Cathie Hoops, Amy V, PA-C  fluticasone (FLONASE) 50 MCG/ACT nasal spray Place 2 sprays into both nostrils daily. 10/15/15 05/18/19  Lyman Bishop,  Junie Panning, NP    Family History Family History  Problem Relation Age of Onset   Asthma Other    Cancer Other    Asthma Mother    Hypertension Mother    Asthma Father    Asthma Sister        2 sisters   Cancer Maternal Grandmother        breast cancer   Hearing loss Maternal Grandmother     Social History Social History   Tobacco Use   Smoking status: Never   Smokeless tobacco: Never  Vaping Use   Vaping Use: Never used  Substance Use Topics   Alcohol use: No   Drug use: No     Allergies   Montelukast sodium, Prednisone, and Tessalon [benzonatate]   Review of Systems Review of Systems  Genitourinary:  Positive for dysuria.  All other systems reviewed and are negative.   Physical Exam Triage Vital Signs ED Triage Vitals  Enc Vitals Group      BP 01/13/21 1130 117/77     Pulse Rate 01/13/21 1130 74     Resp 01/13/21 1130 (!) 69     Temp 01/13/21 1130 98.7 F (37.1 C)     Temp Source 01/13/21 1130 Oral     SpO2 01/13/21 1130 99 %     Weight --      Height --      Head Circumference --      Peak Flow --      Pain Score 01/13/21 1131 4     Pain Loc --      Pain Edu? --      Excl. in [place]? --    No data found.  Updated Vital Signs BP 117/77 (BP Location: Right Arm)   Pulse 74   Temp 98.7 F (37.1 C) (Oral)   Resp 16   SpO2 99%   Physical Exam Vitals and nursing note reviewed.  Constitutional:      Appearance: Normal appearance. She is normal weight.  HENT:     Head: Normocephalic and atraumatic.     Mouth/Throat:     Mouth: Mucous membranes are moist.     Pharynx: Oropharynx is clear.  Eyes:     Extraocular Movements: Extraocular movements intact.     Conjunctiva/sclera: Conjunctivae normal.     Pupils: Pupils are equal, round, and reactive to light.  Cardiovascular:     Rate and Rhythm: Normal rate and regular rhythm.     Pulses: Normal pulses.     Heart sounds: Normal heart sounds.  Pulmonary:     Effort: Pulmonary effort is normal.     Breath sounds: Normal breath sounds.  Musculoskeletal:        General: Normal range of motion.     Cervical back: Normal range of motion and neck supple.  Skin:    General: Skin is warm and dry.  Neurological:     General: No focal deficit present.     Mental Status: She is alert and oriented to person, place, and time.     UC Treatments / Results  Labs (all labs ordered are listed, but only abnormal results are displayed) Labs Reviewed  POCT URINALYSIS DIP (MANUAL ENTRY) - Abnormal; Notable for the following components:      Result Value   Clarity, UA cloudy (*)    Blood, UA moderate (*)    Protein Ur, POC =30 (*)    Leukocytes, UA Small (1+) (*)    All other  components within normal limits  URINE CULTURE    EKG   Radiology No results  found.  Procedures Procedures (including critical care time)  Medications Ordered in UC Medications - No data to display  Initial Impression / Assessment and Plan / UC Course  I have reviewed the triage vital signs and the nursing notes.  Pertinent labs & imaging results that were available during my care of the patient were reviewed by me and considered in my medical decision making (see chart for details).     MDM: 1.  Acute cystitis with hematuria-Rx'd Macrobid. Advised patient to take medication as directed with food to completion.  Advised patient we will follow-up with urinary culture results once returned.  Patient discharged home, hemodynamically stable. Final Clinical Impressions(s) / UC Diagnoses   Final diagnoses:  Acute cystitis with hematuria     Discharge Instructions      Advised patient to take medication as directed with food to completion.  Advised patient we will follow-up with urine culture results once returned.     ED Prescriptions     Medication Sig Dispense Auth. Provider   nitrofurantoin, macrocrystal-monohydrate, (MACROBID) 100 MG capsule Take 1 capsule (100 mg total) by mouth 2 (two) times daily for 7 days. 14 capsule Eliezer Lofts, FNP      PDMP not reviewed this encounter.   Eliezer Lofts, [place] 01/13/21 1211

## 2021-01-13 NOTE — Discharge Instructions (Addendum)
Advised patient to take medication as directed with food to completion.  Advised patient we will follow-up with urine culture results once returned.

## 2021-01-15 LAB — URINE CULTURE
MICRO NUMBER:: 12595645
SPECIMEN QUALITY:: ADEQUATE

## 2021-01-29 ENCOUNTER — Encounter (HOSPITAL_COMMUNITY): Payer: Self-pay

## 2021-01-29 ENCOUNTER — Other Ambulatory Visit: Payer: Self-pay

## 2021-01-29 ENCOUNTER — Ambulatory Visit (HOSPITAL_COMMUNITY)
Admission: RE | Admit: 2021-01-29 | Discharge: 2021-01-29 | Disposition: A | Discharge status: Home / Self Care | Source: Ambulatory Visit | Attending: Student | Admitting: Student

## 2021-01-29 VITALS — BP 148/83 | HR 103 | Temp 98.8°F | Resp 17

## 2021-01-29 DIAGNOSIS — N939 Abnormal uterine and vaginal bleeding, unspecified: Secondary | ICD-10-CM

## 2021-01-29 DIAGNOSIS — D500 Iron deficiency anemia secondary to blood loss (chronic): Secondary | ICD-10-CM | POA: Diagnosis not present

## 2021-01-29 LAB — POCT URINALYSIS DIPSTICK, ED / UC
Bilirubin Urine: NEGATIVE
Glucose, UA: NEGATIVE mg/dL
Ketones, ur: NEGATIVE mg/dL
Leukocytes,Ua: NEGATIVE
Nitrite: NEGATIVE
Protein, ur: NEGATIVE mg/dL
Specific Gravity, Urine: 1.015 (ref 1.005–1.030)
Urobilinogen, UA: 0.2 mg/dL (ref 0.0–1.0)
pH: 5.5 (ref 5.0–8.0)

## 2021-01-29 LAB — POC URINE PREG, ED: Preg Test, Ur: NEGATIVE

## 2021-01-29 MED ORDER — MEGESTROL ACETATE 40 MG PO TABS: 40.0000 mg | ORAL_TABLET | Freq: Two times a day (BID) | ORAL | 0 refills | Status: AC

## 2021-01-29 NOTE — Discharge Instructions (Addendum)
-  Megace twice daily x21 days, see your gynecologist before stopping this medication -Continue iron supplement -Follow-up with gyn as scheduled 02/16/21

## 2021-01-29 NOTE — ED Provider Notes (Signed)
[facility]    CSN: [id number] Arrival date & time: 01/29/21  1238      History   Chief Complaint Chief Complaint  Patient presents with   Appointment    1300    HPI Melanie Cain is a 25 y.o. female presenting with abnormal menstrual bleeding for about 5 months.  Medical history endometriosis status post laparoscopy, and AUB.  She is followed by GYN for this, unfortunately they canceled her appointment 1 week ago and so she presents to the urgent care today.  States that she has been bleeding since June (5 months), this vacillates between scant brown blood and copious red blood.  She is currently requiring 3 pads an hour, and feels weak and anemic.  She is already taking daily iron supplement.  She is taking both Yaz OCP and also Nexplanon, which are not providing control of her heavy periods.  Denies STI risk or vaginal symptoms like discharge.  She does endorse some crampy lower abdominal pain consistent with menstrual cramps, denies flank pain, fever/chills, dysuria, hematuria.  States she cannot be pregnant.  States it feels like the endometriosis is back. Next appt with gyn is 12/8.  HPI  Past Medical History:  Diagnosis Date   Allergy    Anemia    Asthma    Menometrorrhagia    MRSA (methicillin resistant staph aureus) culture positive 2012   culture from abscess   Vaginal delivery 07/20/2013    Patient Active Problem List   Diagnosis Date Noted   Endometriosis 09/02/2019   Positive GBS test 11/23/2015   Vitamin D deficiency 11/23/2015   Asthma 11/23/2015   Drug allergy--prednisone 11/23/2015    Past Surgical History:  Procedure Laterality Date   NO PAST SURGERIES     WISDOM TOOTH EXTRACTION Bilateral     OB History     Gravida  2   Para  2   Term  2   Preterm  0   AB  0   Living  2      SAB  0   IAB  0   Ectopic  0   Multiple  0   Live Births  2            Home Medications    Prior to Admission medications    Medication Sig Start Date End Date Taking? Authorizing Provider  megestrol (MEGACE) 40 MG tablet Take 1 tablet (40 mg total) by mouth 2 (two) times daily for 21 days. 01/29/21 02/19/21 Yes Rhys MartiniGraham, Delvina Mizzell E, PA-C  acetaminophen (TYLENOL) 500 MG tablet Take 500 mg by mouth every 6 (six) hours as needed for fever.     [provider]  albuterol (PROVENTIL) (2.5 MG/3ML) 0.083% nebulizer solution Take 3 mLs (2.5 mg total) by nebulization every 6 (six) hours as needed for wheezing or shortness of breath. 05/12/20   Grayce SessionsEdwards, Michelle P, NP  Cetirizine HCl 10 MG CAPS Take 1 capsule (10 mg total) by mouth daily. 03/22/20   Alfonse SpruceGallagher, Joel Louis, MD  drospirenone-ethinyl estradiol (YAZ) 3-0.02 MG tablet Take 1 tablet by mouth daily. Only take active hormonal pills in a continuous fashion 11/28/20   Federico FlakeNewton, Kimberly Niles, MD  EPINEPHrine 0.3 mg/0.3 mL IJ SOAJ injection Inject into the muscle. 06/21/20   [provider]  fluticasone (FLOVENT HFA) 110 MCG/ACT inhaler Inhale 2 puffs into the lungs 2 (two) times daily. 03/22/20   Alfonse SpruceGallagher, Joel Louis, MD  Fluticasone-Salmeterol (ADVAIR) 500-50 MCG/DOSE AEPB Inhale 2 puffs into  the lungs as needed. 10/13/20   Volney American, PA-C  omeprazole (PRILOSEC) 20 MG capsule Take 1 capsule (20 mg total) by mouth daily. Patient not taking: Reported on 01/13/2021 05/12/20   Kerin Perna, NP  ondansetron (ZOFRAN ODT) 4 MG disintegrating tablet Take 1 tablet (4 mg total) by mouth every 8 (eight) hours as needed for nausea or vomiting. Patient not taking: Reported on 01/13/2021 07/10/20   Loni Beckwith, PA-C  pantoprazole (PROTONIX) 40 MG tablet Take 1 tablet (40 mg total) by mouth daily. 06/21/20 07/21/20  Valentina Shaggy, MD  famotidine (PEPCID) 20 MG tablet Take 1 tablet (20 mg total) by mouth 2 (two) times daily. 05/29/19 10/29/19  Tasia Catchings, Amy V, PA-C  fluticasone (FLONASE) 50 MCG/ACT nasal spray Place 2 sprays into both nostrils daily. 10/15/15 05/18/19   Jorje Guild, NP    Family History Family History  Problem Relation Age of Onset   Asthma Other    Cancer Other    Asthma Mother    Hypertension Mother    Asthma Father    Asthma Sister        2 sisters   Cancer Maternal Grandmother        breast cancer   Hearing loss Maternal Grandmother     Social History Social History   Tobacco Use   Smoking status: Never   Smokeless tobacco: Never  Vaping Use   Vaping Use: Never used  Substance Use Topics   Alcohol use: No   Drug use: No     Allergies   Montelukast sodium, Prednisone, and Tessalon [benzonatate]   Review of Systems Review of Systems  Constitutional:  Negative for chills and fever.  HENT:  Negative for sore throat.   Eyes:  Negative for pain and redness.  Respiratory:  Negative for shortness of breath.   Cardiovascular:  Negative for chest pain.  Gastrointestinal:  Positive for abdominal pain. Negative for diarrhea, nausea and vomiting.  Genitourinary:  Positive for menstrual problem. Negative for decreased urine volume, difficulty urinating, dysuria, flank pain, frequency, genital sores, hematuria, urgency, vaginal bleeding, vaginal discharge and vaginal pain.  Musculoskeletal:  Negative for back pain.  Skin:  Negative for rash.  All other systems reviewed and are negative.   Physical Exam Triage Vital Signs ED Triage Vitals  Enc Vitals Group     BP 01/29/21 1304 (!) 148/83     Pulse Rate 01/29/21 1304 (!) 103     Resp 01/29/21 1304 17     Temp 01/29/21 1304 98.8 F (37.1 C)     Temp Source 01/29/21 1304 Oral     SpO2 01/29/21 1304 98 %     Weight --      Height --      Head Circumference --      Peak Flow --      Pain Score 01/29/21 1303 8     Pain Loc --      Pain Edu? --      Excl. in [place]? --    No data found.  Updated Vital Signs BP (!) 148/83 (BP Location: Right Arm)   Pulse (!) 103   Temp 98.8 F (37.1 C) (Oral)   Resp 17   LMP  (Within Months) Comment: Per pt she is bleeding  since July 2022.  SpO2 98%   Visual Acuity Right Eye Distance:   Left Eye Distance:   Bilateral Distance:    Right Eye Near:   Left Eye Near:  Bilateral Near:     Physical Exam Vitals reviewed.  Constitutional:      General: She is not in acute distress.    Appearance: Normal appearance. She is not ill-appearing.  HENT:     Head: Normocephalic and atraumatic.     Mouth/Throat:     Mouth: Mucous membranes are moist.     Comments: Moist mucous membranes Eyes:     Extraocular Movements: Extraocular movements intact.     Pupils: Pupils are equal, round, and reactive to light.  Cardiovascular:     Rate and Rhythm: Normal rate and regular rhythm.     Heart sounds: Normal heart sounds.  Pulmonary:     Effort: Pulmonary effort is normal.     Breath sounds: Normal breath sounds. No wheezing, rhonchi or rales.  Abdominal:     General: Bowel sounds are normal. There is no distension.     Palpations: Abdomen is soft. There is no mass.     Tenderness: There is no abdominal tenderness. There is no right CVA tenderness, left CVA tenderness, guarding or rebound.  Skin:    General: Skin is warm.     Capillary Refill: Capillary refill takes less than 2 seconds.     Comments: Good skin turgor  Neurological:     General: No focal deficit present.     Mental Status: She is alert and oriented to person, place, and time.  Psychiatric:        Mood and Affect: Mood normal.        Behavior: Behavior normal.     UC Treatments / Results  Labs (all labs ordered are listed, but only abnormal results are displayed) Labs Reviewed  POCT URINALYSIS DIPSTICK, ED / UC - Abnormal; Notable for the following components:      Result Value   Hgb urine dipstick LARGE (*)    All other components within normal limits  POC URINE PREG, ED    EKG   Radiology No results found.  Procedures Procedures (including critical care time)  Medications Ordered in UC Medications - No data to  display  Initial Impression / Assessment and Plan / UC Course  I have reviewed the triage vital signs and the nursing notes.  Pertinent labs & imaging results that were available during my care of the patient were reviewed by me and considered in my medical decision making (see chart for details).     This patient is a very pleasant 25 y.o. year old female presenting with AUB- suspect endometriosis. Heavy periods despite both Yaz and Nexplanon. Borderline tachy but afebrile.  U-preg negative today.  She declines CBC as she is already taking iron supplementation; continue this. Rec recheck by OB-GYN. Denies STI risk or urinary sx.  Megace sent at patient request. This patient does have an OB-GYN. Strongly advised her to follow-up with them as soon as possible   ED return precautions discussed. Patient verbalizes understanding and agreement.   Coding Level 4 for acute illness with systemic symptoms, and prescription drug management  Final Clinical Impressions(s) / UC Diagnoses   Final diagnoses:  Abnormal uterine bleeding (AUB)  Iron deficiency anemia due to chronic blood loss     Discharge Instructions      -Megace twice daily x21 days, see your gynecologist before stopping this medication -Continue iron supplement -Follow-up with gyn as scheduled 02/16/21     ED Prescriptions     Medication Sig Dispense Auth. Provider   megestrol (MEGACE) 40 MG tablet Take  1 tablet (40 mg total) by mouth 2 (two) times daily for 21 days. 42 tablet Rhys Martini, PA-C      PDMP not reviewed this encounter.   Rhys Martini, PA-C 01/29/21 1336

## 2021-01-29 NOTE — ED Triage Notes (Signed)
Pt reports having vaginal bleeding since July 2022; lower abdominal pain x 1 month.

## 2021-01-30 ENCOUNTER — Encounter (HOSPITAL_COMMUNITY): Payer: Self-pay

## 2021-01-30 ENCOUNTER — Emergency Department (HOSPITAL_COMMUNITY)
Admission: EM | Admit: 2021-01-30 | Discharge: 2021-01-30 | Disposition: A | Discharge status: Home / Self Care | Attending: Emergency Medicine | Admitting: Emergency Medicine

## 2021-01-30 ENCOUNTER — Other Ambulatory Visit: Payer: Self-pay

## 2021-01-30 DIAGNOSIS — J45909 Unspecified asthma, uncomplicated: Secondary | ICD-10-CM | POA: Insufficient documentation

## 2021-01-30 DIAGNOSIS — Z7952 Long term (current) use of systemic steroids: Secondary | ICD-10-CM | POA: Diagnosis not present

## 2021-01-30 DIAGNOSIS — R1030 Lower abdominal pain, unspecified: Secondary | ICD-10-CM | POA: Diagnosis not present

## 2021-01-30 DIAGNOSIS — N938 Other specified abnormal uterine and vaginal bleeding: Secondary | ICD-10-CM | POA: Diagnosis not present

## 2021-01-30 DIAGNOSIS — N939 Abnormal uterine and vaginal bleeding, unspecified: Secondary | ICD-10-CM

## 2021-01-30 DIAGNOSIS — Z79899 Other long term (current) drug therapy: Secondary | ICD-10-CM | POA: Insufficient documentation

## 2021-01-30 DIAGNOSIS — N809 Endometriosis, unspecified: Secondary | ICD-10-CM | POA: Insufficient documentation

## 2021-01-30 LAB — CBC WITH DIFFERENTIAL/PLATELET
Abs Immature Granulocytes: 0.01 10*3/uL (ref 0.00–0.07)
Basophils Absolute: 0 10*3/uL (ref 0.0–0.1)
Basophils Relative: 1 %
Eosinophils Absolute: 0.2 10*3/uL (ref 0.0–0.5)
Eosinophils Relative: 2 %
HCT: 41.5 % (ref 36.0–46.0)
Hemoglobin: 13.2 g/dL (ref 12.0–15.0)
Immature Granulocytes: 0 %
Lymphocytes Relative: 40 %
Lymphs Abs: 2.6 10*3/uL (ref 0.7–4.0)
MCH: 26.4 pg (ref 26.0–34.0)
MCHC: 31.8 g/dL (ref 30.0–36.0)
MCV: 83 fL (ref 80.0–100.0)
Monocytes Absolute: 0.4 10*3/uL (ref 0.1–1.0)
Monocytes Relative: 5 %
Neutro Abs: 3.4 10*3/uL (ref 1.7–7.7)
Neutrophils Relative %: 52 %
Platelets: 223 10*3/uL (ref 150–400)
RBC: 5 MIL/uL (ref 3.87–5.11)
RDW: 14.6 % (ref 11.5–15.5)
WBC: 6.6 10*3/uL (ref 4.0–10.5)
nRBC: 0 % (ref 0.0–0.2)

## 2021-01-30 LAB — URINALYSIS, ROUTINE W REFLEX MICROSCOPIC
Bacteria, UA: NONE SEEN
Bilirubin Urine: NEGATIVE
Glucose, UA: NEGATIVE mg/dL
Ketones, ur: NEGATIVE mg/dL
Leukocytes,Ua: NEGATIVE
Nitrite: NEGATIVE
Protein, ur: NEGATIVE mg/dL
Specific Gravity, Urine: 1.006 (ref 1.005–1.030)
pH: 7 (ref 5.0–8.0)

## 2021-01-30 LAB — BASIC METABOLIC PANEL
Anion gap: 9 (ref 5–15)
BUN: 5 mg/dL — ABNORMAL LOW (ref 6–20)
CO2: 21 mmol/L — ABNORMAL LOW (ref 22–32)
Calcium: 9.3 mg/dL (ref 8.9–10.3)
Chloride: 107 mmol/L (ref 98–111)
Creatinine, Ser: 0.73 mg/dL (ref 0.44–1.00)
GFR, Estimated: >60 mL/min (ref >60)
Glucose, Bld: 97 mg/dL (ref 70–99)
Potassium: 3.5 mmol/L (ref 3.5–5.1)
Sodium: 137 mmol/L (ref 135–145)

## 2021-01-30 LAB — PREGNANCY, URINE: Preg Test, Ur: NEGATIVE

## 2021-01-30 NOTE — Discharge Instructions (Addendum)
1) Please call your OBGYN to try and expedited follow up due to worsening vaginal bleeding and cramping.  2) Stay hydrated drinking lots of fluids.  3) Return for worsening dizziness, passing out, sharp unilateral pain.

## 2021-01-30 NOTE — ED Provider Notes (Signed)
[place] EM[facility] DEPARTMENT Provider Note   CSN: [id number] Arrival date & time: 01/30/21  1541     History No chief complaint on file.   Melanie Cain is a 25 y.o. female.  The history is provided by the patient and medical records.  Vaginal Bleeding Quality:  Dark red and clots Severity:  Moderate Onset quality:  Gradual Duration:  4 months Timing:  Constant Progression:  Worsening Chronicity:  Chronic Menstrual history: heavy, regular. Number of pads used:  3/hr Possible pregnancy: no   Context: at rest   Relieved by:  Nothing Worsened by:  Nothing Ineffective treatments: Nexplanon, Yaz, recently started Megace. Associated symptoms: no abdominal pain, no back pain, no dysuria and no fever   Risk factors: hx of endometriosis   Risk factors: no bleeding disorder, no hx of ectopic pregnancy, no ovarian cysts, no PID and no STD exposure       Past Medical History:  Diagnosis Date   Allergy    Anemia    Asthma    Menometrorrhagia    MRSA (methicillin resistant staph aureus) culture positive 2012   culture from abscess   Vaginal delivery 07/20/2013    Patient Active Problem List   Diagnosis Date Noted   Endometriosis 09/02/2019   Positive GBS test 11/23/2015   Vitamin D deficiency 11/23/2015   Asthma 11/23/2015   Drug allergy--prednisone 11/23/2015    Past Surgical History:  Procedure Laterality Date   NO PAST SURGERIES     WISDOM TOOTH EXTRACTION Bilateral      OB History     Gravida  2   Para  2   Term  2   Preterm  0   AB  0   Living  2      SAB  0   IAB  0   Ectopic  0   Multiple  0   Live Births  2           Family History  Problem Relation Age of Onset   Asthma Other    Cancer Other    Asthma Mother    Hypertension Mother    Asthma Father    Asthma Sister        2 sisters   Cancer Maternal Grandmother        breast cancer   Hearing loss Maternal Grandmother     Social History    Tobacco Use   Smoking status: Never   Smokeless tobacco: Never  Vaping Use   Vaping Use: Never used  Substance Use Topics   Alcohol use: No   Drug use: No    Home Medications Prior to Admission medications   Medication Sig Start Date End Date Taking? Authorizing Provider  acetaminophen (TYLENOL) 500 MG tablet Take 500 mg by mouth every 6 (six) hours as needed for fever.     [provider]  albuterol (PROVENTIL) (2.5 MG/3ML) 0.083% nebulizer solution Take 3 mLs (2.5 mg total) by nebulization every 6 (six) hours as needed for wheezing or shortness of breath. 05/12/20   Kerin Perna, NP  Cetirizine HCl 10 MG CAPS Take 1 capsule (10 mg total) by mouth daily. 03/22/20   Valentina Shaggy, MD  drospirenone-ethinyl estradiol (YAZ) 3-0.02 MG tablet Take 1 tablet by mouth daily. Only take active hormonal pills in a continuous fashion 11/28/20   Caren Macadam, MD  EPINEPHrine 0.3 mg/0.3 mL IJ SOAJ injection Inject into the muscle. 06/21/20   [provider]  fluticasone (FLOVENT HFA) 110 MCG/ACT inhaler Inhale 2 puffs into the lungs 2 (two) times daily. 03/22/20   Alfonse Spruce, MD  Fluticasone-Salmeterol (ADVAIR) 500-50 MCG/DOSE AEPB Inhale 2 puffs into the lungs as needed. 10/13/20   Particia Nearing, PA-C  megestrol (MEGACE) 40 MG tablet Take 1 tablet (40 mg total) by mouth 2 (two) times daily for 21 days. 01/29/21 02/19/21  Rhys Martini, PA-C  omeprazole (PRILOSEC) 20 MG capsule Take 1 capsule (20 mg total) by mouth daily. Patient not taking: Reported on 01/13/2021 05/12/20   Grayce Sessions, NP  ondansetron (ZOFRAN ODT) 4 MG disintegrating tablet Take 1 tablet (4 mg total) by mouth every 8 (eight) hours as needed for nausea or vomiting. Patient not taking: Reported on 01/13/2021 07/10/20   Haskel Schroeder, PA-C  pantoprazole (PROTONIX) 40 MG tablet Take 1 tablet (40 mg total) by mouth daily. 06/21/20 07/21/20  Alfonse Spruce, MD   famotidine (PEPCID) 20 MG tablet Take 1 tablet (20 mg total) by mouth 2 (two) times daily. 05/29/19 10/29/19  Cathie Hoops, Amy V, PA-C  fluticasone (FLONASE) 50 MCG/ACT nasal spray Place 2 sprays into both nostrils daily. 10/15/15 05/18/19  Judeth Horn, NP    Allergies    Montelukast sodium, Prednisone, and Tessalon [benzonatate]  Review of Systems   Review of Systems  Constitutional:  Negative for chills and fever.  HENT:  Negative for ear pain and sore throat.   Eyes:  Negative for pain and visual disturbance.  Respiratory:  Negative for cough and shortness of breath.   Cardiovascular:  Negative for chest pain and palpitations.  Gastrointestinal:  Negative for abdominal pain and vomiting.  Genitourinary:  Positive for pelvic pain and vaginal bleeding. Negative for dysuria and hematuria.  Musculoskeletal:  Negative for arthralgias and back pain.  Skin:  Negative for color change and rash.  Neurological:  Negative for seizures and syncope.  All other systems reviewed and are negative.  Physical Exam Updated Vital Signs BP 118/82 (BP Location: Left Arm)   Pulse 61   Temp 98 F (36.7 C) (Tympanic)   Resp 16   SpO2 100%   Physical Exam Vitals and nursing note reviewed.  Constitutional:      General: She is not in acute distress.    Appearance: Normal appearance. She is well-developed.  HENT:     Head: Normocephalic and atraumatic.     Right Ear: External ear normal.     Left Ear: External ear normal.     Nose: Nose normal. No congestion or rhinorrhea.     Mouth/Throat:     Mouth: Mucous membranes are moist.  Eyes:     Extraocular Movements: Extraocular movements intact.     Conjunctiva/sclera: Conjunctivae normal.     Pupils: Pupils are equal, round, and reactive to light.  Cardiovascular:     Rate and Rhythm: Normal rate and regular rhythm.     Pulses: Normal pulses.     Heart sounds: No murmur heard. Pulmonary:     Effort: Pulmonary effort is normal. No respiratory distress.      Breath sounds: Normal breath sounds. No wheezing, rhonchi or rales.  Abdominal:     General: Abdomen is flat. Bowel sounds are normal.     Palpations: Abdomen is soft.     Tenderness: There is no abdominal tenderness. There is no guarding or rebound.     Comments: Diffuse mild lower abdominal tenderness  Musculoskeletal:  General: No swelling, tenderness or deformity.     Cervical back: Normal range of motion and neck supple. No rigidity.  Skin:    General: Skin is warm and dry.     Capillary Refill: Capillary refill takes less than 2 seconds.  Neurological:     General: No focal deficit present.     Mental Status: She is alert and oriented to person, place, and time.     Cranial Nerves: No cranial nerve deficit.     Sensory: No sensory deficit.     Motor: No weakness.     Coordination: Coordination normal.     Gait: Gait normal.  Psychiatric:        Mood and Affect: Mood normal.    ED Results / Procedures / Treatments   Labs (all labs ordered are listed, but only abnormal results are displayed) Labs Reviewed  BASIC METABOLIC PANEL - Abnormal; Notable for the following components:      Result Value   CO2 21 (*)    BUN 5 (*)    All other components within normal limits  URINALYSIS, ROUTINE W REFLEX MICROSCOPIC - Abnormal; Notable for the following components:   Hgb urine dipstick LARGE (*)    All other components within normal limits  CBC WITH DIFFERENTIAL/PLATELET  PREGNANCY, URINE    EKG None  Radiology No results found.  Procedures Procedures   Medications Ordered in ED Medications - No data to display  ED Course  I have reviewed the triage vital signs and the nursing notes.  Pertinent labs & imaging results that were available during my care of the patient were reviewed by me and considered in my medical decision making (see chart for details).    MDM Rules/Calculators/A&P                          25 y/o female w/ known endometriosis  presenting w/ vaginal bleeding and abd cramping. Exam as above. Hgb stable at 13.2. Not tachycardic. Cap refill intact. Low concern for severe hemorrhage.  UA neg leuks, low concern for cystitis, pyelo. UPT neg. Low concern for ectopic or miscarriage. Cr normal, elctrolytes reassuring. History and exam not c/w torsion. No indications for imaging at this time. No discharge, fevers, std exposure. Low concern for PID. Patient able to stand and ambulate w/o difficulty. Tolerating PO. Appropriate for dc home w/ close obgyn f/u. Encouraged patient to call to try and expedite f/u, which is currently scheduled for 12/8. To continue Megace Rx along w/ yaz and nexplanon. Strict return precautions provided.    Final Clinical Impression(s) / ED Diagnoses Final diagnoses:  Vaginal bleeding  Endometriosis    Rx / DC Orders ED Discharge Orders     None        Idamae Lusher, MD 01/30/21 2257    Blanchie Dessert, MD 01/31/21 2249

## 2021-01-30 NOTE — ED Provider Notes (Signed)
Emergency Medicine Provider Triage Evaluation Note  Melanie Cain , a 25 y.o. female  was evaluated in triage.  Pt complains of menstrual bleeding.  She has been having intermittent menstrual bleeding since July, unable to be seen by OBG.  History of endometriosis, on Yaz and has a Nexplanon.  She was started on Megace yesterday by urgent care but the bleeding and lower abdominal cramping worsened today.  She felt presyncopal, came to ED.Marland Kitchen  Review of Systems  Positive: Vaginal bleeding, abdominal cramping, presyncope Negative: Chest pain, shortness of breath  Physical Exam  BP 134/87 (BP Location: Left Arm)   Pulse 72   Temp 98.6 F (37 C)   Resp 16   SpO2 100%  Gen:   Awake, no distress   Resp:  Normal effort  MSK:   Moves extremities without difficulty  Other:  Abdomen is soft, some slight left lower quadrant tenderness  Medical Decision Making  Medically screening exam initiated at 4:15 PM.  Appropriate orders placed.  Shaelin Lalley [place] was informed that the remainder of the evaluation will be completed by another provider, this initial triage assessment does not replace that evaluation, and the importance of remaining in the ED until their evaluation is complete.  Vaginal bleeding   Theron Arista, PA-C 01/30/21 1617    Mancel Bale, MD 01/30/21 [phone]

## 2021-01-30 NOTE — ED Triage Notes (Signed)
Patient complains of lower abdominal cramping and relates to endometriosis. States that she has had vaginal bleeding since July. Patient alert and oriented, complains of fatigue

## 2021-01-31 ENCOUNTER — Telehealth: Payer: Self-pay | Admitting: Family Medicine

## 2021-01-31 NOTE — Telephone Encounter (Signed)
Call placed to pt.Spoke with pt. Pt states having vaginal bleeding and small clots for last 2 months. Pt has been seen in ER x 3 in the past month. Pt is scheduled for GYN visit on 02/16/21.  Pt states has hx of endometriosis and is currently taking Rx Megace, has Nexplanon and Rx Yaz. Pt was advised to take all these meds until appt with [place] in Dec per the ER provider.  Pt states changing pad often, but having soaking intermittently. Had CBC yesterday in ER that was WNL. No anemia noted. Pt advised to keep appt in office and if symptoms worsen and she is feeling dizzy, weak, lightheaded, then go back to ER before visit at office. Also advised pt can start calling office to be seen earlier if we have a cancellation. Pt agreeable to plan of care and verbalized understanding.   Judeth Cornfield, RN

## 2021-01-31 NOTE — Telephone Encounter (Signed)
Patient called in state she has been to the ED twice for Abnormal uterine bleeding (AUB)and they told her to call here she is scheduled for an appointment on 12-8

## 2021-02-01 ENCOUNTER — Ambulatory Visit: Admitting: Family Medicine

## 2021-02-16 ENCOUNTER — Ambulatory Visit (INDEPENDENT_AMBULATORY_CARE_PROVIDER_SITE_OTHER): Admitting: Certified Nurse Midwife

## 2021-02-16 ENCOUNTER — Encounter: Payer: Self-pay | Admitting: Certified Nurse Midwife

## 2021-02-16 ENCOUNTER — Other Ambulatory Visit: Payer: Self-pay

## 2021-02-16 ENCOUNTER — Other Ambulatory Visit (HOSPITAL_COMMUNITY)
Admission: RE | Admit: 2021-02-16 | Discharge: 2021-02-16 | Disposition: A | Discharge status: Home / Self Care | Source: Ambulatory Visit | Attending: Family Medicine | Admitting: Family Medicine

## 2021-02-16 VITALS — BP 129/78 | HR 64 | Wt 150.8 lb

## 2021-02-16 DIAGNOSIS — Z01411 Encounter for gynecological examination (general) (routine) with abnormal findings: Secondary | ICD-10-CM

## 2021-02-16 DIAGNOSIS — N939 Abnormal uterine and vaginal bleeding, unspecified: Secondary | ICD-10-CM | POA: Diagnosis not present

## 2021-02-16 DIAGNOSIS — Z01419 Encounter for gynecological examination (general) (routine) without abnormal findings: Secondary | ICD-10-CM

## 2021-02-16 NOTE — Progress Notes (Signed)
Patient pelvic US scheduled for at 02/23/21 at 12:45 PM. Patient notified.

## 2021-02-16 NOTE — Progress Notes (Signed)
GYNECOLOGY CLINIC ANNUAL PREVENTATIVE CARE ENCOUNTER NOTE  Subjective:   Melanie Cain is a 25 y.o. [id number] female here for a routine annual gynecologic exam.  Current complaints: ongoing abnormal uterine bleeding being treated by Nexplanon, OCPs and megace. Bleeding only stopped while on megace. Was diagnosed with endometriosis in early 2020 and had laparoscopic surgery (in [place]) to clear endometrial tissue/adhesions . Had respite from bleeding for a couple of months, then bleeding returned and it was recommended (by [facility]) she have a Nexplanon placed. Bleeding initially decreased to spotting, then picked up to heavy with clots again. OCPs prescribed but bleeding still did not cease. Pt presented to ED on 01/30/21, prescribed megace and told to follow up with our office. Has two children, does not want more and strongly desires a hysterectomy. Also reports painful intercourse similar to just before she was diagnosed with endometriosis.    Gynecologic History No LMP recorded (lmp unknown). Contraception: OCP (estrogen/progesterone) and Nexplanon Last Pap: Never .   Obstetric History OB History  Gravida Para Term Preterm AB Living  2 2 2  0 0 2  SAB IAB Ectopic Multiple Live Births  0 0 0 0 2    # Outcome Date GA Lbr Len/2nd Weight Sex Delivery Anes PTL Lv  2 Term 11/23/15 [redacted]w[redacted]d / 00:14 7 lb 9.3 oz (3.439 kg) F Vag-Spont None  LIV  1 Term 07/19/13 [redacted]w[redacted]d 31:23 / 00:35 6 lb 9.5 oz (2.991 kg) F Vag-Spont Local  LIV    Past Medical History:  Diagnosis Date   Allergy    Anemia    Asthma    Menometrorrhagia    MRSA (methicillin resistant staph aureus) culture positive 2012   culture from abscess   Vaginal delivery 07/20/2013    Past Surgical History:  Procedure Laterality Date   NO PAST SURGERIES     WISDOM TOOTH EXTRACTION Bilateral     Current Outpatient Medications on File Prior to Visit  Medication Sig Dispense Refill   acetaminophen (TYLENOL) 500 MG tablet Take 500 mg  by mouth every 6 (six) hours as needed for fever.      albuterol (PROVENTIL) (2.5 MG/3ML) 0.083% nebulizer solution Take 3 mLs (2.5 mg total) by nebulization every 6 (six) hours as needed for wheezing or shortness of breath. 75 mL 0   Cetirizine HCl 10 MG CAPS Take 1 capsule (10 mg total) by mouth daily. 90 capsule 3   drospirenone-ethinyl estradiol (YAZ) 3-0.02 MG tablet Take 1 tablet by mouth daily. Only take active hormonal pills in a continuous fashion 84 tablet 1   EPINEPHrine 0.3 mg/0.3 mL IJ SOAJ injection Inject into the muscle.     fluticasone (FLOVENT HFA) 110 MCG/ACT inhaler Inhale 2 puffs into the lungs 2 (two) times daily. 1 each 5   Fluticasone-Salmeterol (ADVAIR) 500-50 MCG/DOSE AEPB Inhale 2 puffs into the lungs as needed. 60 each 0   megestrol (MEGACE) 40 MG tablet Take 1 tablet (40 mg total) by mouth 2 (two) times daily for 21 days. 42 tablet 0   omeprazole (PRILOSEC) 20 MG capsule Take 1 capsule (20 mg total) by mouth daily. (Patient not taking: Reported on 01/13/2021) 90 capsule 3   ondansetron (ZOFRAN ODT) 4 MG disintegrating tablet Take 1 tablet (4 mg total) by mouth every 8 (eight) hours as needed for nausea or vomiting. (Patient not taking: Reported on 01/13/2021) 20 tablet 0   pantoprazole (PROTONIX) 40 MG tablet Take 1 tablet (40 mg total) by mouth daily. 30 tablet  5   [DISCONTINUED] famotidine (PEPCID) 20 MG tablet Take 1 tablet (20 mg total) by mouth 2 (two) times daily. 30 tablet 0   [DISCONTINUED] fluticasone (FLONASE) 50 MCG/ACT nasal spray Place 2 sprays into both nostrils daily. 16 g 0   No current facility-administered medications on file prior to visit.    Allergies  Allergen Reactions   Montelukast Sodium Other (See Comments)    Reaction:  Hallucinations    Prednisone Swelling and Other (See Comments)    Reaction:  Tongue swelling   Tessalon [Benzonatate] Hives    Social History   Socioeconomic History   Marital status: Single    Spouse name: Not on  file   Number of children: Not on file   Years of education: Not on file   Highest education level: Not on file  Occupational History   Not on file  Tobacco Use   Smoking status: Never   Smokeless tobacco: Never  Vaping Use   Vaping Use: Never used  Substance and Sexual Activity   Alcohol use: No   Drug use: No   Sexual activity: Yes    Birth control/protection: Implant, Pill  Other Topics Concern   Not on file  Social History Narrative   Not on file   Social Determinants of Health   Financial Resource Strain: Not on file  Food Insecurity: No Food Insecurity   Worried About Running Out of Food in the Last Year: Never true   Ran Out of Food in the Last Year: Never true  Transportation Needs: No Transportation Needs   Lack of Transportation (Medical): No   Lack of Transportation (Non-Medical): No  Physical Activity: Not on file  Stress: Not on file  Social Connections: Not on file  Intimate Partner Violence: Not on file    Family History  Problem Relation Age of Onset   Asthma Other    Cancer Other    Asthma Mother    Hypertension Mother    Asthma Father    Asthma Sister        2 sisters   Cancer Maternal Grandmother        breast cancer   Hearing loss Maternal Grandmother     The following portions of the patient's history were reviewed and updated as appropriate: allergies, current medications, past family history, past medical history, past social history, past surgical history and problem list.  Review of Systems Pertinent items noted in HPI and remainder of comprehensive ROS otherwise negative.   Objective:  BP 129/78   Pulse 64   Wt 150 lb 12.8 oz (68.4 kg)   LMP  (LMP Unknown) Comment: Has had continually bleeding  BMI 25.09 kg/m  CONSTITUTIONAL: Well-developed, well-nourished female in no acute distress.  HENT:  Normocephalic, atraumatic, External right and left ear normal.  EYES: Conjunctivae and EOM are normal. Pupils are equal, round, and  reactive to light. No scleral icterus.  NECK: Normal range of motion SKIN: Skin is warm and dry. No rash noted. Not diaphoretic. No erythema. No pallor. NEUROLGIC: Alert and oriented to person, place, and time. Normal reflexes, muscle tone coordination. No cranial nerve deficit noted. PSYCHIATRIC: Normal mood and affect. Normal behavior. Normal judgment and thought content. CARDIOVASCULAR: Normal heart rate noted, regular rhythm RESPIRATORY: Effort normal, no problems with respiration noted. BREASTS: Symmetric in size. No masses, skin changes, nipple drainage, or lymphadenopathy. ABDOMEN: Soft, no distention noted.   PELVIC: Normal appearing external genitalia; normal appearing vaginal mucosa and cervix.  No abnormal  discharge noted.  Pap smear obtained.  Normal uterine size, no other palpable masses, no uterine or adnexal tenderness. MUSCULOSKELETAL: Normal range of motion. No tenderness.  No cyanosis, clubbing, or edema.  2+ distal pulses.   Assessment & Plan:  Annual gynecologic examination with pap smear - Will follow up results of pap smear and manage accordingly.  Heavy uterine bleeding - pelvic ultrasound ordered - discussed additional treatments for bleeding including an IUD which the patient does not want. Also does not want to stop any of the current therapies until she has a longer term plan in place. - Referring to Dr. Shawnie Pons for surgical management/planning.  Routine preventative health maintenance measures emphasized. Please refer to After Visit Summary for other counseling recommendations.   Follow up asap with Dr. Shawnie Pons. Will refill megace since it is controlling her bleeding.  Bernerd Limbo, CNM

## 2021-02-16 NOTE — Progress Notes (Signed)
Patient has elevated GAD-7. Declined [facility].   Alesia Richards, RN  02/16/21

## 2021-02-21 LAB — CYTOLOGY - PAP
Chlamydia: NEGATIVE
Comment: NEGATIVE
Comment: NEGATIVE
Comment: NORMAL
Diagnosis: NEGATIVE
Neisseria Gonorrhea: NEGATIVE
Trichomonas: NEGATIVE

## 2021-02-22 ENCOUNTER — Ambulatory Visit: Admitting: Family Medicine

## 2021-02-23 ENCOUNTER — Other Ambulatory Visit: Payer: Self-pay | Admitting: Certified Nurse Midwife

## 2021-02-23 ENCOUNTER — Other Ambulatory Visit: Payer: Self-pay

## 2021-02-23 ENCOUNTER — Ambulatory Visit
Admission: RE | Admit: 2021-02-23 | Discharge: 2021-02-23 | Disposition: A | Discharge status: Home / Self Care | Source: Ambulatory Visit | Attending: Certified Nurse Midwife | Admitting: Certified Nurse Midwife

## 2021-02-23 DIAGNOSIS — N939 Abnormal uterine and vaginal bleeding, unspecified: Secondary | ICD-10-CM | POA: Diagnosis present

## 2021-02-24 ENCOUNTER — Ambulatory Visit (INDEPENDENT_AMBULATORY_CARE_PROVIDER_SITE_OTHER): Admitting: Family Medicine

## 2021-02-24 ENCOUNTER — Encounter: Payer: Self-pay | Admitting: Family Medicine

## 2021-02-24 ENCOUNTER — Other Ambulatory Visit

## 2021-02-24 VITALS — BP 120/67 | HR 68 | Ht 65.0 in | Wt 149.3 lb

## 2021-02-24 DIAGNOSIS — N809 Endometriosis, unspecified: Secondary | ICD-10-CM | POA: Diagnosis not present

## 2021-02-24 DIAGNOSIS — N921 Excessive and frequent menstruation with irregular cycle: Secondary | ICD-10-CM | POA: Diagnosis not present

## 2021-02-24 DIAGNOSIS — N939 Abnormal uterine and vaginal bleeding, unspecified: Secondary | ICD-10-CM

## 2021-02-24 MED ORDER — MEGESTROL ACETATE 40 MG PO TABS: 40.0000 mg | ORAL_TABLET | Freq: Two times a day (BID) | ORAL | 3 refills | Status: DC

## 2021-02-24 NOTE — Progress Notes (Signed)
° °  Subjective:    Patient ID: Melanie Cain is a 25 y.o. female presenting with Follow-up  on 02/24/2021  HPI: Has h/o endometriosis. Has h/o laparoscopy. Reports trial of OC's and Megace to control her bleeding.  Bleeding is irregular and frequent and unpredictable. Has nexplanon in place and is using OC's. Mom is with her today and has similar h/o bleeding and has been supportive in decision for hysterectomy. [id number] With SVD x 2.  Review of Systems  Constitutional:  Negative for chills and fever.  Respiratory:  Negative for shortness of breath.   Cardiovascular:  Negative for chest pain.  Gastrointestinal:  Negative for abdominal pain, nausea and vomiting.  Genitourinary:  Positive for vaginal bleeding. Negative for dysuria.  Skin:  Negative for rash.     Objective:    BP 120/67    Pulse 68    Ht 5\' 5"  (1.651 m)    Wt 149 lb 4.8 oz (67.7 kg)    LMP  (LMP Unknown) Comment: Has had continually bleeding   BMI 24.84 kg/m  Physical Exam Constitutional:      General: She is not in acute distress.    Appearance: She is well-developed.  HENT:     Head: Normocephalic and atraumatic.  Eyes:     General: No scleral icterus. Cardiovascular:     Rate and Rhythm: Normal rate.  Pulmonary:     Effort: Pulmonary effort is normal.  Abdominal:     Palpations: Abdomen is soft.  Musculoskeletal:     Cervical back: Neck supple.  Skin:    General: Skin is warm and dry.  Neurological:     Mental Status: She is alert and oriented to person, place, and time.        Assessment & Plan:   Problem List Items Addressed This Visit       Unprioritized   Menorrhagia    Is bleeding likely due to nexplanon, declined attempts at removal and less definitive treatment options. Feels strongly that she has completed childbearing and desires definitive treatment. Will book for [facility] with bilateral salpingectomy. Ovarian preservation discussed. Risks include but are not limited to bleeding,  infection, injury to surrounding structures, including bowel, bladder and ureters, blood clots, and death.  Likelihood of success is high.       Endometriosis - Primary    Using megace to control bleeding with OCs and Nexplanon--may return for Nexplanon removal and continue OCs. Diagnosed on laparoscopy.       Return in about 4 weeks (around 03/24/2021) for nexplanon removal.  03/26/2021 02/24/2021 10:50 AM

## 2021-02-25 LAB — CBC
Hematocrit: 44.2 % (ref 34.0–46.6)
Hemoglobin: 13.9 g/dL (ref 11.1–15.9)
MCH: 26.3 pg — ABNORMAL LOW (ref 26.6–33.0)
MCHC: 31.4 g/dL — ABNORMAL LOW (ref 31.5–35.7)
MCV: 84 fL (ref 79–97)
Platelets: 221 10*3/uL (ref 150–450)
RBC: 5.29 x10E6/uL — ABNORMAL HIGH (ref 3.77–5.28)
RDW: 14.5 % (ref 11.7–15.4)
WBC: 5.2 10*3/uL (ref 3.4–10.8)

## 2021-02-25 LAB — THYROID PANEL WITH TSH
Free Thyroxine Index: 2.1 (ref 1.2–4.9)
T3 Uptake Ratio: 24 % (ref 24–39)
T4, Total: 8.7 ug/dL (ref 4.5–12.0)
TSH: 0.959 u[IU]/mL (ref 0.450–4.500)

## 2021-02-27 ENCOUNTER — Encounter: Payer: Self-pay | Admitting: Family Medicine

## 2021-02-27 NOTE — Assessment & Plan Note (Signed)
Is bleeding likely due to nexplanon, declined attempts at removal and less definitive treatment options. Feels strongly that she has completed childbearing and desires definitive treatment. Will book for [facility] with bilateral salpingectomy. Ovarian preservation discussed. Risks include but are not limited to bleeding, infection, injury to surrounding structures, including bowel, bladder and ureters, blood clots, and death.  Likelihood of success is high.

## 2021-02-27 NOTE — Assessment & Plan Note (Signed)
Using megace to control bleeding with OCs and Nexplanon--may return for Nexplanon removal and continue OCs. Diagnosed on laparoscopy.

## 2021-03-10 ENCOUNTER — Ambulatory Visit: Payer: Self-pay

## 2021-03-23 ENCOUNTER — Encounter: Payer: Self-pay | Admitting: Family Medicine

## 2021-03-23 ENCOUNTER — Other Ambulatory Visit: Payer: Self-pay

## 2021-03-23 ENCOUNTER — Ambulatory Visit (INDEPENDENT_AMBULATORY_CARE_PROVIDER_SITE_OTHER): Admitting: Family Medicine

## 2021-03-23 VITALS — BP 121/78 | HR 77 | Wt 155.1 lb

## 2021-03-23 DIAGNOSIS — N921 Excessive and frequent menstruation with irregular cycle: Secondary | ICD-10-CM

## 2021-03-23 DIAGNOSIS — F419 Anxiety disorder, unspecified: Secondary | ICD-10-CM

## 2021-03-23 DIAGNOSIS — Z3046 Encounter for surveillance of implantable subdermal contraceptive: Secondary | ICD-10-CM | POA: Diagnosis not present

## 2021-03-23 NOTE — Progress Notes (Signed)
° °  Subjective:    Patient ID: Melanie Cain is a 26 y.o. female presenting with Nexplanon Removal  on 03/23/2021  HPI: Here for Nexplanon removal--on OC's and Megace to try to stop bleeding. Booked for [facility].  Review of Systems  Constitutional:  Negative for chills and fever.  Respiratory:  Negative for shortness of breath.   Cardiovascular:  Negative for chest pain.  Gastrointestinal:  Negative for abdominal pain, nausea and vomiting.  Genitourinary:  Negative for dysuria.  Skin:  Negative for rash.     Objective:    BP 121/78    Pulse 77    Wt 155 lb 1.6 oz (70.4 kg)    BMI 25.81 kg/m  Physical Exam Constitutional:      General: She is not in acute distress.    Appearance: She is well-developed.  HENT:     Head: Normocephalic and atraumatic.  Eyes:     General: No scleral icterus. Cardiovascular:     Rate and Rhythm: Normal rate.  Pulmonary:     Effort: Pulmonary effort is normal.  Abdominal:     Palpations: Abdomen is soft.  Musculoskeletal:     Cervical back: Neck supple.  Skin:    General: Skin is warm and dry.  Neurological:     Mental Status: She is alert and oriented to person, place, and time.   Procedure: Patient given informed consent for removal of her Nexplanon, time out was performed.  Signed copy in the chart.  Appropriate time out taken. Nexplanon site identified.  Area prepped in usual sterile fashon. One cc of 1% lidocaine was used to anesthetize the area at the distal end of the implant. A small stab incision was made right beside the implant on the distal portion.  The Nexplanon rod was grasped using hemostats and removed without difficulty.  There was less than 3 cc blood loss. There were no complications.  A small amount of antibiotic ointment and steri-strips were applied over the small incision.  A pressure bandage was applied to reduce any bruising.  The patient tolerated the procedure well and was given post procedure instructions.       Assessment & Plan:   Problem List Items Addressed This Visit       Unprioritized   Menorrhagia    Stop taking Megace. Continue OC's until surgery.      Other Visit Diagnoses     Anxiety    -  Primary   To see [facility]   Relevant Orders   Ambulatory referral to [facility]   Nexplanon removal           Return in about 4 weeks (around 04/20/2021) for postop check.  [name] 03/23/2021 1:38 PM

## 2021-03-23 NOTE — Assessment & Plan Note (Signed)
Stop taking Megace. Continue OC's until surgery.

## 2021-03-24 ENCOUNTER — Encounter: Payer: Self-pay | Admitting: *Deleted

## 2021-04-04 NOTE — H&P (Signed)
Melanie Cain is an 26 y.o. [phone] female.   Chief Complaint: pelvic pain and abnormal bleeding HPI: Patient with bleeding. On OC's, Megace and still bleeding. She desires definitive treatment.  Past Medical History:  Diagnosis Date   Allergy    Anemia    Asthma    Menometrorrhagia    MRSA (methicillin resistant staph aureus) culture positive 2012   culture from abscess   Positive GBS test 11/23/2015   Vaginal delivery 07/20/2013    Past Surgical History:  Procedure Laterality Date   LAPAROSCOPY ABDOMEN DIAGNOSTIC     NO PAST SURGERIES     WISDOM TOOTH EXTRACTION Bilateral     Family History  Problem Relation Age of Onset   Breast cancer Maternal Grandmother    Cancer Maternal Grandmother 40       breast cancer   Hearing loss Maternal Grandmother    Asthma Father    Asthma Mother    Hypertension Mother    Asthma Sister        2 sisters   Asthma Other    Social History:  reports that she has never smoked. She has never used smokeless tobacco. She reports that she does not drink alcohol and does not use drugs.  Allergies:  Allergies  Allergen Reactions   Montelukast Sodium Other (See Comments)    Reaction:  Hallucinations    Prednisone Swelling and Other (See Comments)    Reaction:  Tongue swelling   Tessalon [Benzonatate] Hives    No medications prior to admission.    A comprehensive review of systems was negative.  There were no vitals taken for this visit. There were no vitals taken for this visit. General appearance: alert, cooperative, and appears stated age Head: Normocephalic, without obvious abnormality, atraumatic Neck: no adenopathy, supple, symmetrical, trachea midline, and thyroid not enlarged, symmetric, no tenderness/mass/nodules Heart: regular rate and rhythm Abdomen: soft, non-tender; bowel sounds normal; no masses,  no organomegaly Extremities: extremities normal, atraumatic, no cyanosis or edema Skin: Skin color, texture, turgor  normal. No rashes or lesions Neurologic: Grossly normal   Lab Results  Component Value Date   WBC 5.2 02/24/2021   HGB 13.9 02/24/2021   HCT 44.2 02/24/2021   MCV 84 02/24/2021   PLT 221 02/24/2021   Lab Results  Component Value Date   PREGTESTUR NEGATIVE 01/30/2021   HCG <5.0 07/28/2019     Assessment/Plan Principal Problem:   Menorrhagia Active Problems:   Endometriosis  For [facility] with possible salpingectomy Risks include but are not limited to bleeding, infection, injury to surrounding structures, including bowel, bladder and ureters, blood clots, and death.  Likelihood of success is high.    [name] 04/04/2021, 3:24 PM

## 2021-04-04 NOTE — Pre-Procedure Instructions (Signed)
Surgical Instructions    Your procedure is scheduled on Tuesday, January 31st.  Report to [facility] Main Entrance "A" at 7:30 A.M., then check in with the Admitting office.  Call this number if you have problems the morning of surgery:  [phone]   If you have any questions prior to your surgery date call [phone]: Open Monday-Friday 8am-4pm    Remember:  Do not eat after midnight the night before your surgery  You may drink clear liquids until 6:30 a.m. the morning of your surgery.   Clear liquids allowed are: Water, Non-Citrus Juices (without pulp), Carbonated Beverages, Clear Tea, Black Coffee ONLY (NO MILK, CREAM OR POWDERED CREAMER of any kind), and Gatorade    Take these medicines the morning of surgery  fluticasone (FLONASE) fluticasone (FLOVENT HFA)   As needed: acetaminophen (TYLENOL)  Albuterol nebulizer azelastine (ASTELIN)  Cetirizine HCl  EPINEPHrine Fluticasone-Salmeterol (ADVAIR)  As of today, STOP taking any Aspirin (unless otherwise instructed by your surgeon) Aleve, Naproxen, Ibuprofen, Motrin, Advil, Goody's, BC's, all herbal medications, fish oil, and all vitamins.  After your COVID test   You are not required to quarantine however you are required to wear a well-fitting mask when you are out and around people not in your household.  If your mask becomes wet or soiled, replace with a new one.  Wash your hands often with soap and water for 20 seconds or clean your hands with an alcohol-based hand sanitizer that contains at least 60% alcohol.  Do not share personal items.  Notify your provider: if you are in close contact with someone who has COVID  or if you develop a fever of 100.4 or greater, sneezing, cough, sore throat, shortness of breath or body aches.           Do not wear jewelry or makeup Do not wear lotions, powders, perfumes, or deodorant. Do not shave 48 hours prior to surgery.  Do not bring valuables to the hospital. DO Not wear  nail polish, gel polish, artificial nails, or any other type of covering on natural nails (fingers and toes) If you have artificial nails or gel coating that need to be removed by a nail salon, please have this removed prior to surgery. Artificial nails or gel coating may interfere with anesthesia's ability to adequately monitor your vital signs.             [facility] is not responsible for any belongings or valuables.  Do NOT Smoke (Tobacco/Vaping)  24 hours prior to your procedure  If you use a CPAP at night, you may bring your mask for your overnight stay.   Contacts, glasses, hearing aids, dentures or partials may not be worn into surgery, please bring cases for these belongings   For patients admitted to the hospital, discharge time will be determined by your treatment team.   Patients discharged the day of surgery will not be allowed to drive home, and someone needs to stay with them for 24 hours.  NO VISITORS WILL BE ALLOWED IN PRE-OP WHERE PATIENTS ARE PREPPED FOR SURGERY.  ONLY 1 SUPPORT PERSON MAY BE PRESENT IN THE WAITING ROOM WHILE YOU ARE IN SURGERY.  IF YOU ARE TO BE ADMITTED, ONCE YOU ARE IN YOUR ROOM YOU WILL BE ALLOWED TWO (2) VISITORS. 1 (ONE) VISITOR MAY STAY OVERNIGHT BUT MUST ARRIVE TO THE ROOM BY 8pm.  Minor children may have two parents present. Special consideration for safety and communication needs will be reviewed on a case  by case basis.  Special instructions:    Oral Hygiene is also important to reduce your risk of infection.  Remember - BRUSH YOUR TEETH THE MORNING OF SURGERY WITH YOUR REGULAR TOOTHPASTE   [facility]- Preparing For Surgery  Before surgery, you can play an important role. Because skin is not sterile, your skin needs to be as free of germs as possible. You can reduce the number of germs on your skin by washing with CHG (chlorahexidine gluconate) Soap before surgery.  CHG is an antiseptic cleaner which kills germs and bonds with the skin to  continue killing germs even after washing.     Please do not use if you have an allergy to CHG or antibacterial soaps. If your skin becomes reddened/irritated stop using the CHG.  Do not shave (including legs and underarms) for at least 48 hours prior to first CHG shower. It is OK to shave your face.  Please follow these instructions carefully.     Shower the NIGHT BEFORE SURGERY and the MORNING OF SURGERY with CHG Soap.   If you chose to wash your hair, wash your hair first as usual with your normal shampoo. After you shampoo, rinse your hair and body thoroughly to remove the shampoo.  Then [employer] and genitals (private parts) with your normal soap and rinse thoroughly to remove soap.  After that Use CHG Soap as you would any other liquid soap. You can apply CHG directly to the skin and wash gently with a scrungie or a clean washcloth.   Apply the CHG Soap to your body ONLY FROM THE NECK DOWN.  Do not use on open wounds or open sores. Avoid contact with your eyes, ears, mouth and genitals (private parts). Wash Face and genitals (private parts)  with your normal soap.   Wash thoroughly, paying special attention to the area where your surgery will be performed.  Thoroughly rinse your body with warm water from the neck down.  DO NOT shower/wash with your normal soap after using and rinsing off the CHG Soap.  Pat yourself dry with a CLEAN TOWEL.  Wear CLEAN PAJAMAS to bed the night before surgery  Place CLEAN SHEETS on your bed the night before your surgery  DO NOT SLEEP WITH PETS.   Day of Surgery:  Take a shower with CHG soap. Wear Clean/Comfortable clothing the morning of surgery Do not apply any deodorants/lotions.   Remember to brush your teeth WITH YOUR REGULAR TOOTHPASTE.   Please read over the following fact sheets that you were given.

## 2021-04-05 ENCOUNTER — Encounter (HOSPITAL_COMMUNITY)
Admission: RE | Admit: 2021-04-05 | Discharge: 2021-04-05 | Disposition: A | Discharge status: Home / Self Care | Source: Ambulatory Visit | Attending: Family Medicine | Admitting: Family Medicine

## 2021-04-05 ENCOUNTER — Other Ambulatory Visit: Payer: Self-pay

## 2021-04-05 ENCOUNTER — Encounter (HOSPITAL_COMMUNITY): Payer: Self-pay

## 2021-04-05 DIAGNOSIS — Z01812 Encounter for preprocedural laboratory examination: Secondary | ICD-10-CM | POA: Insufficient documentation

## 2021-04-05 DIAGNOSIS — N921 Excessive and frequent menstruation with irregular cycle: Secondary | ICD-10-CM | POA: Diagnosis not present

## 2021-04-05 DIAGNOSIS — N809 Endometriosis, unspecified: Secondary | ICD-10-CM | POA: Insufficient documentation

## 2021-04-05 HISTORY — DX: Anxiety disorder, unspecified: F41.9

## 2021-04-05 HISTORY — DX: Depression, unspecified: F32.A

## 2021-04-05 HISTORY — DX: Gastro-esophageal reflux disease without esophagitis: K21.9

## 2021-04-05 LAB — CBC WITH DIFFERENTIAL/PLATELET
Abs Immature Granulocytes: 0.01 10*3/uL (ref 0.00–0.07)
Basophils Absolute: 0 10*3/uL (ref 0.0–0.1)
Basophils Relative: 1 %
Eosinophils Absolute: 0.1 10*3/uL (ref 0.0–0.5)
Eosinophils Relative: 1 %
HCT: 43.2 % (ref 36.0–46.0)
Hemoglobin: 13.1 g/dL (ref 12.0–15.0)
Immature Granulocytes: 0 %
Lymphocytes Relative: 59 %
Lymphs Abs: 3.5 10*3/uL (ref 0.7–4.0)
MCH: 26.6 pg (ref 26.0–34.0)
MCHC: 30.3 g/dL (ref 30.0–36.0)
MCV: 87.8 fL (ref 80.0–100.0)
Monocytes Absolute: 0.4 10*3/uL (ref 0.1–1.0)
Monocytes Relative: 6 %
Neutro Abs: 2 10*3/uL (ref 1.7–7.7)
Neutrophils Relative %: 33 %
Platelets: 226 10*3/uL (ref 150–400)
RBC: 4.92 MIL/uL (ref 3.87–5.11)
RDW: 15 % (ref 11.5–15.5)
WBC: 5.9 10*3/uL (ref 4.0–10.5)
nRBC: 0 % (ref 0.0–0.2)

## 2021-04-05 LAB — TYPE AND SCREEN
ABO/RH(D): B POS
Antibody Screen: NEGATIVE

## 2021-04-05 NOTE — Pre-Procedure Instructions (Signed)
Surgical Instructions    Your procedure is scheduled on Tuesday, January 31st.  Report to [facility] Main Entrance "A" at 7:30 A.M., then check in with the Admitting office.  Call this number if you have problems the morning of surgery:  [phone]   If you have any questions prior to your surgery date call [phone]: Open Monday-Friday 8am-4pm    Remember:  Do not eat after midnight the night before your surgery  You may drink clear liquids until 6:30 a.m. the morning of your surgery.   Clear liquids allowed are: Water, Non-Citrus Juices (without pulp), Carbonated Beverages, Clear Tea, Black Coffee ONLY (NO MILK, CREAM OR POWDERED CREAMER of any kind), and Gatorade    Take these medicines the morning of surgery  fluticasone (FLONASE) fluticasone (FLOVENT HFA)   As needed: acetaminophen (TYLENOL)  Albuterol nebulizer azelastine (ASTELIN)  Cetirizine HCl  EPINEPHrine Fluticasone-Salmeterol (ADVAIR)  As of today, STOP taking any Aspirin (unless otherwise instructed by your surgeon) Aleve, Naproxen, Ibuprofen, Motrin, Advil, Goody's, BC's, all herbal medications, fish oil, and all vitamins.  After your COVID test   You are not required to quarantine however you are required to wear a well-fitting mask when you are out and around people not in your household.  If your mask becomes wet or soiled, replace with a new one.  Wash your hands often with soap and water for 20 seconds or clean your hands with an alcohol-based hand sanitizer that contains at least 60% alcohol.  Do not share personal items.  Notify your provider: if you are in close contact with someone who has COVID  or if you develop a fever of 100.4 or greater, sneezing, cough, sore throat, shortness of breath or body aches.           DAY OF SURGERY: Do not wear jewelry, makeup, or nail polish Do not wear lotions, powders, perfumes, or deodorant. Do not shave 48 hours prior to surgery.  Do not bring valuables  to the hospital.            [facility] is not responsible for any belongings or valuables.  Do NOT Smoke (Tobacco/Vaping)  24 hours prior to your procedure  If you use a CPAP at night, you may bring your mask for your overnight stay.   Contacts, glasses, hearing aids, dentures or partials may not be worn into surgery, please bring cases for these belongings   For patients admitted to the hospital, discharge time will be determined by your treatment team.   Patients discharged the day of surgery will not be allowed to drive home, and someone needs to stay with them for 24 hours.  NO VISITORS WILL BE ALLOWED IN PRE-OP WHERE PATIENTS ARE PREPPED FOR SURGERY.  ONLY 1 SUPPORT PERSON MAY BE PRESENT IN THE WAITING ROOM WHILE YOU ARE IN SURGERY.  IF YOU ARE TO BE ADMITTED, ONCE YOU ARE IN YOUR ROOM YOU WILL BE ALLOWED TWO (2) VISITORS. 1 (ONE) VISITOR MAY STAY OVERNIGHT BUT MUST ARRIVE TO THE ROOM BY 8pm.  Minor children may have two parents present. Special consideration for safety and communication needs will be reviewed on a case by case basis.  Special instructions:    Oral Hygiene is also important to reduce your risk of infection.  Remember - BRUSH YOUR TEETH THE MORNING OF SURGERY WITH YOUR REGULAR TOOTHPASTE   [place]- Preparing For Surgery  Before surgery, you can play an important role. Because skin is not sterile, your skin  needs to be as free of germs as possible. You can reduce the number of germs on your skin by washing with CHG (chlorahexidine gluconate) Soap before surgery.  CHG is an antiseptic cleaner which kills germs and bonds with the skin to continue killing germs even after washing.     Please do not use if you have an allergy to CHG or antibacterial soaps. If your skin becomes reddened/irritated stop using the CHG.  Do not shave (including legs and underarms) for at least 48 hours prior to first CHG shower. It is OK to shave your face.  Please follow these  instructions carefully.     Shower the NIGHT BEFORE SURGERY and the MORNING OF SURGERY with CHG Soap.   If you chose to wash your hair, wash your hair first as usual with your normal shampoo. After you shampoo, rinse your hair and body thoroughly to remove the shampoo.  Then [employer] and genitals (private parts) with your normal soap and rinse thoroughly to remove soap.  After that Use CHG Soap as you would any other liquid soap. You can apply CHG directly to the skin and wash gently with a scrungie or a clean washcloth.   Apply the CHG Soap to your body ONLY FROM THE NECK DOWN.  Do not use on open wounds or open sores. Avoid contact with your eyes, ears, mouth and genitals (private parts). Wash Face and genitals (private parts)  with your normal soap.   Wash thoroughly, paying special attention to the area where your surgery will be performed.  Thoroughly rinse your body with warm water from the neck down.  DO NOT shower/wash with your normal soap after using and rinsing off the CHG Soap.  Pat yourself dry with a CLEAN TOWEL.  Wear CLEAN PAJAMAS to bed the night before surgery  Place CLEAN SHEETS on your bed the night before your surgery  DO NOT SLEEP WITH PETS.   Day of Surgery:  Take a shower with CHG soap. Wear Clean/Comfortable clothing the morning of surgery Do not apply any deodorants/lotions.   Remember to brush your teeth WITH YOUR REGULAR TOOTHPASTE.   Please read over the following fact sheets that you were given.

## 2021-04-05 NOTE — Progress Notes (Signed)
PCP - denies Cardiologist - denies  Chest x-ray - n/a EKG - 07/15/20   ERAS Protcol - yes, no drink ordered or given   COVID TEST- Friday 04/07/21 @ 11:00 (Outpatient in bed)   Anesthesia review: n/a  Patient denies shortness of breath, fever, cough and chest pain at PAT appointment   All instructions explained to the patient, with a verbal understanding of the material. Patient agrees to go over the instructions while at home for a better understanding. Patient also instructed to self quarantine after being tested for COVID-19. The opportunity to ask questions was provided.

## 2021-04-07 ENCOUNTER — Other Ambulatory Visit (HOSPITAL_COMMUNITY)
Admission: RE | Admit: 2021-04-07 | Discharge: 2021-04-07 | Disposition: A | Discharge status: Home / Self Care | Source: Ambulatory Visit | Attending: Family Medicine | Admitting: Family Medicine

## 2021-04-07 DIAGNOSIS — Z01818 Encounter for other preprocedural examination: Secondary | ICD-10-CM

## 2021-04-07 DIAGNOSIS — Z20822 Contact with and (suspected) exposure to covid-19: Secondary | ICD-10-CM | POA: Diagnosis not present

## 2021-04-07 DIAGNOSIS — Z01812 Encounter for preprocedural laboratory examination: Secondary | ICD-10-CM | POA: Diagnosis present

## 2021-04-07 LAB — SARS CORONAVIRUS 2 (TAT 6-24 HRS): SARS Coronavirus 2: NEGATIVE

## 2021-04-10 NOTE — Anesthesia Preprocedure Evaluation (Addendum)
Anesthesia Evaluation  Patient identified by MRN, date of birth, ID band Patient awake    Reviewed: Allergy & Precautions, NPO status , Patient's Chart, lab work & pertinent test results  History of Anesthesia Complications Negative for: history of anesthetic complications  Airway Mallampati: II  TM Distance: >3 FB Neck ROM: Full    Dental no notable dental hx.    Pulmonary asthma , Patient abstained from smoking.,    Pulmonary exam normal        Cardiovascular negative cardio ROS Normal cardiovascular exam     Neuro/Psych Anxiety Depression negative neurological ROS     GI/Hepatic Neg liver ROS, GERD  ,  Endo/Other  negative endocrine ROS  Renal/GU negative Renal ROS  negative genitourinary   Musculoskeletal negative musculoskeletal ROS (+)   Abdominal   Peds  Hematology negative hematology ROS (+)   Anesthesia Other Findings Day of surgery medications reviewed with patient.  Reproductive/Obstetrics negative OB ROS                            Anesthesia Physical Anesthesia Plan  ASA: 2  Anesthesia Plan: General   Post-op Pain Management: Tylenol PO (pre-op) and Toradol IV (intra-op)   Induction: Intravenous  PONV Risk Score and Plan: 3 and Treatment may vary due to age or medical condition, Midazolam, Scopolamine patch - Pre-op, Ondansetron and Dexamethasone  Airway Management Planned: LMA  Additional Equipment: None  Intra-op Plan:   Post-operative Plan: Extubation in OR  Informed Consent: I have reviewed the patients History and Physical, chart, labs and discussed the procedure including the risks, benefits and alternatives for the proposed anesthesia with the patient or authorized representative who has indicated his/her understanding and acceptance.     Dental advisory given  Plan Discussed with: CRNA  Anesthesia Plan Comments:        Anesthesia Quick  Evaluation

## 2021-04-11 ENCOUNTER — Ambulatory Visit (HOSPITAL_COMMUNITY)
Admission: RE | Admit: 2021-04-11 | Discharge: 2021-04-11 | Disposition: A | Discharge status: Home / Self Care | Source: Ambulatory Visit | Attending: Family Medicine | Admitting: Family Medicine

## 2021-04-11 ENCOUNTER — Encounter (HOSPITAL_COMMUNITY)
Admission: RE | Disposition: A | Discharge status: Home / Self Care | Payer: Self-pay | Source: Ambulatory Visit | Attending: Family Medicine

## 2021-04-11 ENCOUNTER — Other Ambulatory Visit: Payer: Self-pay

## 2021-04-11 ENCOUNTER — Encounter (HOSPITAL_COMMUNITY): Payer: Self-pay | Admitting: Family Medicine

## 2021-04-11 ENCOUNTER — Ambulatory Visit (HOSPITAL_COMMUNITY): Admitting: Anesthesiology

## 2021-04-11 DIAGNOSIS — N939 Abnormal uterine and vaginal bleeding, unspecified: Secondary | ICD-10-CM | POA: Diagnosis present

## 2021-04-11 DIAGNOSIS — Z90710 Acquired absence of both cervix and uterus: Secondary | ICD-10-CM | POA: Diagnosis present

## 2021-04-11 DIAGNOSIS — N920 Excessive and frequent menstruation with regular cycle: Secondary | ICD-10-CM | POA: Diagnosis not present

## 2021-04-11 DIAGNOSIS — N921 Excessive and frequent menstruation with irregular cycle: Secondary | ICD-10-CM | POA: Diagnosis not present

## 2021-04-11 DIAGNOSIS — N8000 Endometriosis of the uterus, unspecified: Secondary | ICD-10-CM | POA: Insufficient documentation

## 2021-04-11 DIAGNOSIS — N809 Endometriosis, unspecified: Secondary | ICD-10-CM | POA: Diagnosis present

## 2021-04-11 HISTORY — PX: VAGINAL HYSTERECTOMY: SHX2639

## 2021-04-11 LAB — POCT PREGNANCY, URINE: Preg Test, Ur: NEGATIVE

## 2021-04-11 SURGERY — HYSTERECTOMY, VAGINAL
Anesthesia: General | Site: Uterus | Laterality: Bilateral

## 2021-04-11 MED ORDER — PROPOFOL 10 MG/ML IV BOLUS
INTRAVENOUS | Status: AC
  Filled 2021-04-11: qty 20

## 2021-04-11 MED ORDER — ONDANSETRON HCL 4 MG/2ML IJ SOLN: 4.0000 mg | Freq: Four times a day (QID) | INTRAMUSCULAR | Status: DC | PRN

## 2021-04-11 MED ORDER — FENTANYL CITRATE (PF) 100 MCG/2ML IJ SOLN
25.0000 ug | INTRAMUSCULAR | Status: AC | PRN
  Administered 2021-04-11 (×6): 25 ug via INTRAVENOUS

## 2021-04-11 MED ORDER — ORAL CARE MOUTH RINSE: 15.0000 mL | Freq: Once | OROMUCOSAL | Status: AC

## 2021-04-11 MED ORDER — MIDAZOLAM HCL 5 MG/5ML IJ SOLN
INTRAMUSCULAR | Status: DC | PRN
  Administered 2021-04-11: 2 mg via INTRAVENOUS

## 2021-04-11 MED ORDER — IBUPROFEN 600 MG PO TABS: 600.0000 mg | ORAL_TABLET | Freq: Four times a day (QID) | ORAL | Status: DC

## 2021-04-11 MED ORDER — CHLORHEXIDINE GLUCONATE 0.12 % MT SOLN
15.0000 mL | Freq: Once | OROMUCOSAL | Status: AC
  Administered 2021-04-11: 15 mL via OROMUCOSAL
  Filled 2021-04-11: qty 15

## 2021-04-11 MED ORDER — FENTANYL CITRATE (PF) 250 MCG/5ML IJ SOLN
INTRAMUSCULAR | Status: AC
  Filled 2021-04-11: qty 5

## 2021-04-11 MED ORDER — DEXAMETHASONE SODIUM PHOSPHATE 4 MG/ML IJ SOLN
INTRAMUSCULAR | Status: DC | PRN
  Administered 2021-04-11: 10 mg via INTRAVENOUS

## 2021-04-11 MED ORDER — LIDOCAINE 2% (20 MG/ML) 5 ML SYRINGE
INTRAMUSCULAR | Status: AC
  Filled 2021-04-11: qty 5

## 2021-04-11 MED ORDER — PROPOFOL 10 MG/ML IV BOLUS
INTRAVENOUS | Status: DC | PRN
Start: 2021-04-11 — End: 2021-04-11
  Administered 2021-04-11: 150 mg via INTRAVENOUS

## 2021-04-11 MED ORDER — ACETAMINOPHEN 500 MG PO TABS
1000.0000 mg | ORAL_TABLET | ORAL | Status: DC
  Filled 2021-04-11: qty 2

## 2021-04-11 MED ORDER — KETOROLAC TROMETHAMINE 30 MG/ML IJ SOLN
INTRAMUSCULAR | Status: AC
  Filled 2021-04-11: qty 1

## 2021-04-11 MED ORDER — TEMAZEPAM 15 MG PO CAPS: 15.0000 mg | ORAL_CAPSULE | Freq: Every evening | ORAL | Status: DC | PRN

## 2021-04-11 MED ORDER — SOD CITRATE-CITRIC ACID 500-334 MG/5ML PO SOLN
30.0000 mL | ORAL | Status: AC
  Administered 2021-04-11: 30 mL via ORAL
  Filled 2021-04-11: qty 30

## 2021-04-11 MED ORDER — ACETAMINOPHEN 500 MG PO TABS
ORAL_TABLET | ORAL | Status: AC
  Filled 2021-04-11: qty 2

## 2021-04-11 MED ORDER — ONDANSETRON HCL 4 MG/2ML IJ SOLN
INTRAMUSCULAR | Status: AC
  Filled 2021-04-11: qty 2

## 2021-04-11 MED ORDER — OXYCODONE HCL 5 MG PO TABS: 5.0000 mg | ORAL_TABLET | ORAL | Status: DC | PRN

## 2021-04-11 MED ORDER — ONDANSETRON HCL 4 MG/2ML IJ SOLN
INTRAMUSCULAR | Status: DC | PRN
  Administered 2021-04-11: 4 mg via INTRAVENOUS

## 2021-04-11 MED ORDER — HYDROMORPHONE HCL 1 MG/ML IJ SOLN
INTRAMUSCULAR | Status: AC
  Filled 2021-04-11: qty 1

## 2021-04-11 MED ORDER — ACETAMINOPHEN 500 MG PO TABS: 1000.0000 mg | ORAL_TABLET | Freq: Once | ORAL | Status: DC

## 2021-04-11 MED ORDER — DEXAMETHASONE SODIUM PHOSPHATE 10 MG/ML IJ SOLN
INTRAMUSCULAR | Status: AC
  Filled 2021-04-11: qty 1

## 2021-04-11 MED ORDER — PROMETHAZINE HCL 25 MG/ML IJ SOLN: 6.2500 mg | INTRAMUSCULAR | Status: DC | PRN

## 2021-04-11 MED ORDER — FENTANYL CITRATE (PF) 100 MCG/2ML IJ SOLN
INTRAMUSCULAR | Status: AC
  Filled 2021-04-11: qty 2

## 2021-04-11 MED ORDER — OXYCODONE HCL 5 MG PO TABS: 5.0000 mg | ORAL_TABLET | Freq: Four times a day (QID) | ORAL | 0 refills | Status: DC | PRN

## 2021-04-11 MED ORDER — LACTATED RINGERS IV SOLN: INTRAVENOUS | Status: DC

## 2021-04-11 MED ORDER — OXYCODONE HCL 5 MG PO TABS
5.0000 mg | ORAL_TABLET | Freq: Once | ORAL | Status: AC | PRN
  Administered 2021-04-11: 5 mg via ORAL

## 2021-04-11 MED ORDER — ENOXAPARIN SODIUM 40 MG/0.4ML IJ SOSY: 40.0000 mg | PREFILLED_SYRINGE | INTRAMUSCULAR | Status: DC

## 2021-04-11 MED ORDER — OXYCODONE HCL 5 MG/5ML PO SOLN: 5.0000 mg | Freq: Once | ORAL | Status: AC | PRN

## 2021-04-11 MED ORDER — CEFAZOLIN SODIUM-DEXTROSE 2-4 GM/100ML-% IV SOLN
2.0000 g | INTRAVENOUS | Status: AC
  Administered 2021-04-11: 2 g via INTRAVENOUS
  Filled 2021-04-11: qty 100

## 2021-04-11 MED ORDER — SCOPOLAMINE 1 MG/3DAYS TD PT72
1.0000 | MEDICATED_PATCH | Freq: Once | TRANSDERMAL | Status: DC
  Administered 2021-04-11: 1.5 mg via TRANSDERMAL
  Filled 2021-04-11: qty 1

## 2021-04-11 MED ORDER — ONDANSETRON HCL 4 MG PO TABS: 4.0000 mg | ORAL_TABLET | Freq: Four times a day (QID) | ORAL | Status: DC | PRN

## 2021-04-11 MED ORDER — KETOROLAC TROMETHAMINE 30 MG/ML IJ SOLN
30.0000 mg | Freq: Four times a day (QID) | INTRAMUSCULAR | Status: DC
  Administered 2021-04-11: 30 mg via INTRAVENOUS
  Filled 2021-04-11: qty 1

## 2021-04-11 MED ORDER — POVIDONE-IODINE 10 % EX SWAB
2.0000 "application " | Freq: Once | CUTANEOUS | Status: AC
  Administered 2021-04-11: 2 via TOPICAL

## 2021-04-11 MED ORDER — BISACODYL 10 MG RE SUPP: 10.0000 mg | Freq: Every day | RECTAL | Status: DC | PRN

## 2021-04-11 MED ORDER — ONDANSETRON HCL 4 MG/2ML IJ SOLN: INTRAMUSCULAR | Status: DC | PRN

## 2021-04-11 MED ORDER — ACETAMINOPHEN 500 MG PO TABS
1000.0000 mg | ORAL_TABLET | Freq: Four times a day (QID) | ORAL | Status: DC
  Administered 2021-04-11: 1000 mg via ORAL
  Filled 2021-04-11: qty 2

## 2021-04-11 MED ORDER — LIDOCAINE-EPINEPHRINE 1 %-1:100000 IJ SOLN
INTRAMUSCULAR | Status: DC | PRN
  Administered 2021-04-11: 20 mL

## 2021-04-11 MED ORDER — GABAPENTIN 100 MG PO CAPS
100.0000 mg | ORAL_CAPSULE | Freq: Three times a day (TID) | ORAL | Status: DC
  Administered 2021-04-11: 100 mg via ORAL
  Filled 2021-04-11: qty 1

## 2021-04-11 MED ORDER — OXYCODONE HCL 5 MG PO TABS
ORAL_TABLET | ORAL | Status: AC
  Filled 2021-04-11: qty 1

## 2021-04-11 MED ORDER — DOCUSATE SODIUM 100 MG PO CAPS: 100.0000 mg | ORAL_CAPSULE | Freq: Two times a day (BID) | ORAL | Status: DC

## 2021-04-11 MED ORDER — DEXTROSE IN LACTATED RINGERS 5 % IV SOLN: INTRAVENOUS | Status: DC

## 2021-04-11 MED ORDER — LIDOCAINE 2% (20 MG/ML) 5 ML SYRINGE
INTRAMUSCULAR | Status: DC | PRN
  Administered 2021-04-11: 80 mg via INTRAVENOUS

## 2021-04-11 MED ORDER — FENTANYL CITRATE (PF) 100 MCG/2ML IJ SOLN
INTRAMUSCULAR | Status: DC | PRN
  Administered 2021-04-11 (×2): 25 ug via INTRAVENOUS
  Administered 2021-04-11: 50 ug via INTRAVENOUS
  Administered 2021-04-11: 25 ug via INTRAVENOUS
  Administered 2021-04-11: 50 ug via INTRAVENOUS

## 2021-04-11 MED ORDER — HYDROMORPHONE HCL 1 MG/ML IJ SOLN
0.2500 mg | INTRAMUSCULAR | Status: DC | PRN
  Administered 2021-04-11 (×4): 0.5 mg via INTRAVENOUS

## 2021-04-11 MED ORDER — MIDAZOLAM HCL 2 MG/2ML IJ SOLN
INTRAMUSCULAR | Status: AC
  Filled 2021-04-11: qty 2

## 2021-04-11 MED ORDER — MENTHOL 3 MG MT LOZG: 1.0000 | LOZENGE | OROMUCOSAL | Status: DC | PRN

## 2021-04-11 MED ORDER — HYDROMORPHONE HCL 1 MG/ML IJ SOLN: 0.2000 mg | INTRAMUSCULAR | Status: DC | PRN

## 2021-04-11 SURGICAL SUPPLY — 23 items
CANISTER SUCT 3000ML PPV (MISCELLANEOUS) ×2 IMPLANT
GAUZE 4X4 16PLY ~~LOC~~+RFID DBL (SPONGE) ×2 IMPLANT
GLOVE SURG LTX SZ7 (GLOVE) ×4 IMPLANT
GLOVE SURG UNDER POLY LF SZ6.5 (GLOVE) ×2 IMPLANT
GLOVE SURG UNDER POLY LF SZ7 (GLOVE) ×4 IMPLANT
GOWN STRL REUS W/ TWL LRG LVL3 (GOWN DISPOSABLE) ×5 IMPLANT
GOWN STRL REUS W/TWL LRG LVL3 (GOWN DISPOSABLE) ×2
GOWN STRL REUS W/TWL XL LVL3 (GOWN DISPOSABLE) ×1 IMPLANT
KIT TURNOVER KIT B (KITS) ×2 IMPLANT
NDL SPNL 18GX3.5 QUINCKE PK (NEEDLE) ×1 IMPLANT
NEEDLE SPNL 18GX3.5 QUINCKE PK (NEEDLE) ×2 IMPLANT
NS IRRIG 1000ML POUR BTL (IV SOLUTION) ×2 IMPLANT
PACK VAGINAL WOMENS (CUSTOM PROCEDURE TRAY) ×2 IMPLANT
PAD OB MATERNITY 4.3X12.25 (PERSONAL CARE ITEMS) ×2 IMPLANT
SUT VIC AB 0 CT1 18XCR BRD8 (SUTURE) ×3 IMPLANT
SUT VIC AB 0 CT1 27 (SUTURE) ×1
SUT VIC AB 0 CT1 27XBRD ANBCTR (SUTURE) ×2 IMPLANT
SUT VIC AB 0 CT1 8-18 (SUTURE) ×2
SUT VICRYL 0 TIES 12 18 (SUTURE) ×2 IMPLANT
SYR 20ML LL LF (SYRINGE) ×2 IMPLANT
TOWEL GREEN STERILE FF (TOWEL DISPOSABLE) ×4 IMPLANT
TRAY FOLEY W/BAG SLVR 14FR (SET/KITS/TRAYS/PACK) ×2 IMPLANT
UNDERPAD 30X36 HEAVY ABSORB (UNDERPADS AND DIAPERS) ×2 IMPLANT

## 2021-04-11 NOTE — Anesthesia Procedure Notes (Signed)
Procedure Name: LMA Insertion Date/Time: 04/11/2021 9:07 AM Performed by: Colon Flattery, CRNA Pre-anesthesia Checklist: Patient identified, Emergency Drugs available, Suction available and Patient being monitored Patient Re-evaluated:Patient Re-evaluated prior to induction Oxygen Delivery Method: Circle system utilized Preoxygenation: Pre-oxygenation with 100% oxygen Induction Type: IV induction Ventilation: Mask ventilation without difficulty LMA: LMA inserted LMA Size: 4.0 Placement Confirmation: positive ETCO2 Dental Injury: Teeth and Oropharynx as per pre-operative assessment

## 2021-04-11 NOTE — Discharge Summary (Addendum)
Physician Discharge Summary  Patient ID: Melanie Cain MRN: [id number] DOB/AGE: 1996-03-06 26 y.o.  Admit date: 04/11/2021 Discharge date: 04/11/2021   Admission Diagnoses:  Principal Problem:   Menorrhagia Active Problems:   Endometriosis   Abnormal uterine bleeding   Status post hysterectomy   Discharge Diagnoses:  Same  Past Medical History:  Diagnosis Date   Allergy    Anemia    Anxiety    Asthma    Depression    GERD (gastroesophageal reflux disease)    Menometrorrhagia    MRSA (methicillin resistant staph aureus) culture positive 2012   culture from abscess   Positive GBS test 11/23/2015   Vaginal delivery 07/20/2013    Surgeries: Procedure(s): HYSTERECTOMY VAGINAL WITH BILATERAL SALPINGECTOMY on 04/11/2021   Consultants:   Discharged Condition: Improved  Hospital Course: Melanie Cain is an 26 y.o. female [id number] who was admitted 04/11/2021 with a chief complaint of No chief complaint on file. , and found to have a diagnosis of Menorrhagia.  They were brought to the operating room on 04/11/2021 and underwent the above named procedures.    She was given perioperative antibiotics:  Anti-infectives (From admission, onward)    Start     Dose/Rate Route Frequency Ordered Stop   04/11/21 0745  ceFAZolin (ANCEF) IVPB 2g/100 mL premix        2 g 200 mL/hr over 30 Minutes Intravenous On call to O.R. 04/11/21 7517 04/11/21 0926     .  She was given sequential compression devices, early ambulation, and chemoprophylaxis for DVT prophylaxis.  She benefited maximally from their hospital stay and there were no complications. She was ambulating, voiding, tolerating po and deemed stable for discharge.  Recent vital signs:  Vitals:   04/11/21 1507 04/11/21 1638  BP: 123/68 114/70  Pulse: 67 (!) 59  Resp:  16  Temp:  (!) 97.3 F (36.3 C)  SpO2:  100%   Discharge exam: Physical Examination: General appearance - alert, well appearing, and in no  distress Chest - normal effort Heart - normal rate and regular rhythm Abdomen - soft, appropriately tender, dressing is clean and dry Extremities - peripheral pulses normal, no pedal edema, no clubbing or cyanosis, Homan's sign negative bilaterally  Recent laboratory studies:  Results for orders placed or performed during the hospital encounter of 04/11/21  Pregnancy, urine POC  Result Value Ref Range   Preg Test, Ur NEGATIVE NEGATIVE    Discharge Medications:   Allergies as of 04/11/2021       Reactions   Montelukast Sodium Other (See Comments)   Reaction:  Hallucinations    Prednisone Swelling, Other (See Comments)   Reaction:  Tongue swelling   Tessalon [benzonatate] Hives        Medication List     TAKE these medications    acetaminophen 500 MG tablet Commonly known as: TYLENOL Take 1,000 mg by mouth every 6 (six) hours as needed for moderate pain or headache.   albuterol (2.5 MG/3ML) 0.083% nebulizer solution Commonly known as: PROVENTIL Take 3 mLs (2.5 mg total) by nebulization every 6 (six) hours as needed for wheezing or shortness of breath.   azelastine 0.1 % nasal spray Commonly known as: ASTELIN Place 1 spray into both nostrils daily as needed for rhinitis. Use in each nostril as directed   Cetirizine HCl 10 MG Caps Take 1 capsule (10 mg total) by mouth daily. What changed:  when to take this reasons to take this   EPINEPHrine 0.3 mg/0.3 mL  Soaj injection Commonly known as: EPI-PEN Inject 0.3 mg into the muscle as needed for anaphylaxis.   Flovent HFA 110 MCG/ACT inhaler Generic drug: fluticasone Inhale 2 puffs into the lungs 2 (two) times daily.   fluticasone 50 MCG/ACT nasal spray Commonly known as: FLONASE Place 2 sprays into both nostrils daily.   Fluticasone-Salmeterol 500-50 MCG/DOSE Aepb Commonly known as: ADVAIR Inhale 2 puffs into the lungs as needed.   oxyCODONE 5 MG immediate release tablet Commonly known as: Oxy  IR/ROXICODONE Take 1 tablet (5 mg total) by mouth every 6 (six) hours as needed for severe pain.   pantoprazole 40 MG tablet Commonly known as: PROTONIX Take 1 tablet (40 mg total) by mouth daily.        Diagnostic Studies: No results found.  Disposition: Discharge disposition: 01-Home or Self Care       Discharge Instructions      Remove dressing in 72 hours   Complete by: As directed    Call MD for:  persistant nausea and vomiting   Complete by: As directed    Call MD for:  redness, tenderness, or signs of infection (pain, swelling, redness, odor or green/yellow discharge around incision site)   Complete by: As directed    Call MD for:  severe uncontrolled pain   Complete by: As directed    Call MD for:  temperature >100.4   Complete by: As directed    Diet - low sodium heart healthy   Complete by: As directed    Discharge patient   Complete by: As directed    Discharge disposition: 01-Home or Self Care   Discharge patient date: 04/11/2021   Driving Restrictions   Complete by: As directed    None while taking narcotic pain meds   Increase activity slowly   Complete by: As directed    Lifting restrictions   Complete by: As directed    Nothing > 20 lbs x 6 weeks   Sexual Activity Restrictions   Complete by: As directed    None x 6 weeks        Follow-up Information     [facility] Follow up in 2 week(s).   Specialty: Obstetrics and Gynecology Why: postop check, they will call you with an appointment Contact information: [address] [place] [address] [phone]                 Signed: [name] 04/11/2021, 6:12 PM

## 2021-04-11 NOTE — Op Note (Signed)
Preoperative diagnosis: abnormal uterine bleeding  Postoperative diagnosis: Same  Procedure: Transvaginal hysterectomy and bilateral salpingectomy  Surgeon: Shelbie Proctor. Shawnie Pons, M.D.  Assistant: Nettie Elm, MD An experienced assistant was required given the standard of surgical care given the complexity of the case.  This assistant was needed for exposure, dissection, suctioning, retraction, instrument exchange,  and for overall help during the procedure  Anesthesia: LMA,Howze, Jaclynn Major, MD, MD  Findings: normal appearing uterus, tubes and ovaries.  Estimated blood loss: 150 cc  Specimen: Uterus to pathology  Reason for procedure: Patient had h/o bleeding not responsive to hormonal options, who desired definitive treament. Risks of  hysterectomy reviewed.  Risks include but are not limited to bleeding, infection, injury to surrounding structures, including bowel, bladder and ureters, blood clots, and death.  Likelihood of success of surgery is high.   Procedure: Patient was taken to the OR where she was placed in dorsal lithotomy in Allen stirrups. She was prepped and draped in the usual sterile fashion. A timeout was performed. The patient received 2 g of Ancef prior to procedure. The patient had SCDs in place.  A speculum was placed inside the vagina. The cervix was visualized and grasped with 2 doublle-tooth tenacula. 20 cc of 1% lidocaine with epinephrine were injected paracervically. A knife was used to make a circumferential incision around the vagina. An opened sponge was used to dissect the vagina off the cervix. The posterior peritoneum was entered sharply with Mayo scissors. The posterior peritoneum was tagged to the vaginal cuff with a single stitch. The bladder was dissected upward. A Heaney clamp was used to clamp first the left uterosacral ligament and cardinal which was then cut and Haney suture ligated with 0 Vicryl stitch, the stitch was held. Similarly the right uterosacral  ligament was clamped cut and suture ligated.Sequential bites up the broad to the uterine arteries were taken until the tubo-ovarian pedicles were encountered. The uterus was then inverted and the left utero-ovarian pedicle grasped with a Heaney clamp. The right utero-ovarian pedicle was similarly grasped with the Heaney clamp. The uterus was removed. The left ube were grasped with Babcock clamp and a Kelly clamp placed behind this. Tube removed and the pedicle secured with a free tie\. The right tube grasped with a Babcock clamp and a Kelly clamp used to clamp behind this. The tube was removed on the side and the pedicle secured with a free tie. Inspection of all pedicles revealed adequate hemostasis. The vagina was closed with 0 Vicryl suture in a locked running fashion with care taken to incorporate the uterosacral pedicles. Excellent hemostasis was noted at the end of the case. The vaginal cuff was inspected there was minimal bleeding noted.  A Foley catheter was present inside her bladder. Clear, yellow urine was noted. All instrument needle and lap counts were correct x 2. Patient was awakened taken to recovery room in stable condition.  [name], MD 04/11/2021, 9:49 AM

## 2021-04-11 NOTE — Transfer of Care (Addendum)
Immediate Anesthesia Transfer of Care Note  Patient: Melanie Cain  Procedure(s) Performed: HYSTERECTOMY VAGINAL WITH BILATERAL SALPINGECTOMY (Bilateral: Uterus)  Patient Location: PACU  Anesthesia Type:General  Level of Consciousness: drowsy and patient cooperative  Airway & Oxygen Therapy: Patient Spontanous Breathing  Post-op Assessment: Report given to RN, Post -op Vital signs reviewed and stable and Patient moving all extremities  Post vital signs: Reviewed and stable  Last Vitals:  Vitals Value Taken Time  BP 115/66 04/11/21 1000  Temp    Pulse 65 04/11/21 1001  Resp 14 04/11/21 1001  SpO2 100 % 04/11/21 1001  Vitals shown include unvalidated device data.  Last Pain:  Vitals:   04/11/21 0809  TempSrc:   PainSc: 0-No pain         Complications: No notable events documented.

## 2021-04-11 NOTE — Anesthesia Postprocedure Evaluation (Signed)
Anesthesia Post Note  Patient: Tyreshia Ingman Virella  Procedure(s) Performed: HYSTERECTOMY VAGINAL WITH BILATERAL SALPINGECTOMY (Bilateral: Uterus)     Patient location during evaluation: PACU Anesthesia Type: General Level of consciousness: awake and alert Pain management: pain level controlled Vital Signs Assessment: post-procedure vital signs reviewed and stable Respiratory status: spontaneous breathing, nonlabored ventilation and respiratory function stable Cardiovascular status: blood pressure returned to baseline Postop Assessment: no apparent nausea or vomiting Anesthetic complications: no   No notable events documented.  Last Vitals:  Vitals:   04/11/21 1215 04/11/21 1303  BP: 118/74 121/77  Pulse: (!) 59 65  Resp: 14 14  Temp: 36.6 C 36.6 C  SpO2: 98% 99%    Last Pain:  Vitals:   04/11/21 1400  TempSrc:   PainSc: Asleep                 Shanda Howells

## 2021-04-11 NOTE — Interval H&P Note (Signed)
History and Physical Interval Note:  04/11/2021 8:52 AM  Melanie Cain  has presented today for surgery, with the diagnosis of AUB.  The various methods of treatment have been discussed with the patient and family. After consideration of risks, benefits and other options for treatment, the patient has consented to  Procedure(s): HYSTERECTOMY VAGINAL WITH SALPINGECTOMY (Bilateral) as a surgical intervention.  The patient's history has been reviewed, patient examined, no change in status, stable for surgery.  I have reviewed the patient's chart and labs.  Questions were answered to the patient's satisfaction.     [name]

## 2021-04-12 ENCOUNTER — Encounter (HOSPITAL_COMMUNITY): Payer: Self-pay | Admitting: Family Medicine

## 2021-04-12 LAB — SURGICAL PATHOLOGY

## 2021-05-04 ENCOUNTER — Ambulatory Visit (INDEPENDENT_AMBULATORY_CARE_PROVIDER_SITE_OTHER): Admitting: Family Medicine

## 2021-05-04 ENCOUNTER — Other Ambulatory Visit: Payer: Self-pay

## 2021-05-04 ENCOUNTER — Encounter: Payer: Self-pay | Admitting: Family Medicine

## 2021-05-04 VITALS — BP 120/81 | HR 79 | Ht 65.0 in | Wt 157.2 lb

## 2021-05-04 DIAGNOSIS — Z09 Encounter for follow-up examination after completed treatment for conditions other than malignant neoplasm: Secondary | ICD-10-CM

## 2021-05-04 NOTE — Progress Notes (Signed)
° °  Subjective:    Patient ID: Melanie Cain is a 26 y.o. female presenting with Post-op Follow-up  on 05/04/2021  HPI: S/p TVH with bilateral salpingectomy on 04/11/2021. Since surgery she has had spine pain. Denies fever,  Review of Systems  Constitutional:  Negative for chills and fever.  Respiratory:  Negative for shortness of breath.   Cardiovascular:  Negative for chest pain.  Gastrointestinal:  Negative for abdominal pain, nausea and vomiting.  Genitourinary:  Negative for dysuria.  Skin:  Negative for rash.     Objective:    BP 120/81    Pulse 79    Ht 5\' 5"  (1.651 m)    Wt 157 lb 3.2 oz (71.3 kg)    LMP  (LMP Unknown) Comment: Has had continually bleeding   BMI 26.16 kg/m  Physical Exam Exam conducted with a chaperone present.  Constitutional:      General: She is not in acute distress.    Appearance: She is well-developed.  HENT:     Head: Normocephalic and atraumatic.  Eyes:     General: No scleral icterus. Cardiovascular:     Rate and Rhythm: Normal rate.  Pulmonary:     Effort: Pulmonary effort is normal.  Abdominal:     Palpations: Abdomen is soft.  Genitourinary:    Comments: BUS normal, vagina is pink and rugated, yellow discharge noted, non-tender  Musculoskeletal:     Cervical back: Neck supple.  Skin:    General: Skin is warm and dry.  Neurological:     Mental Status: She is alert and oriented to person, place, and time.        Assessment & Plan:  Postop check - Doing well - heat and iburprofen for her spine pain--precautions reviewed.  Return in about 4 weeks (around 06/01/2021) for a follow-up.  Donnamae Jude, MD 05/04/2021 8:29 AM

## 2021-06-01 ENCOUNTER — Other Ambulatory Visit: Payer: Self-pay

## 2021-06-01 ENCOUNTER — Ambulatory Visit (INDEPENDENT_AMBULATORY_CARE_PROVIDER_SITE_OTHER): Admitting: Student

## 2021-06-01 ENCOUNTER — Encounter: Payer: Self-pay | Admitting: Student

## 2021-06-01 VITALS — BP 126/76 | HR 72 | Wt 162.6 lb

## 2021-06-01 DIAGNOSIS — Z4889 Encounter for other specified surgical aftercare: Secondary | ICD-10-CM | POA: Diagnosis not present

## 2021-06-01 NOTE — Progress Notes (Signed)
?  History:  ?[name] is a 26 y.o. [id number] who presents to clinic today for followup from hysterectomy on 2-1. She reports that she has no abdominal pain; occasional back pain but it is not bothering her at all.  ? ?The following portions of the patient's history were reviewed and updated as appropriate: allergies, current medications, family history, past medical history, social history, past surgical history and problem list. ? ?Review of Systems:  ?Review of Systems  ?Constitutional: Negative.   ?HENT: Negative.    ?Respiratory: Negative.    ?Cardiovascular: Negative.   ?Genitourinary: Negative.   ?Musculoskeletal: Negative.   ?Skin: Negative.   ?Neurological: Negative.   ?Psychiatric/Behavioral: Negative.    ? ?  ?Objective:  ?Physical Exam ?BP 126/76   Pulse 72   Wt 162 lb 9.6 oz (73.8 kg)   LMP  (LMP Unknown) Comment: Has had continually bleeding  BMI 27.06 kg/m?  ?Physical Exam ?Constitutional:   ?   Appearance: Normal appearance.  ?Pulmonary:  ?   Effort: Pulmonary effort is normal.  ?Abdominal:  ?   General: Abdomen is flat.  ?Skin: ?   General: Skin is warm.  ?Neurological:  ?   General: No focal deficit present.  ?   Mental Status: She is alert.  ?Psychiatric:     ?   Mood and Affect: Mood normal.  ? ? ? ? ?Labs and Imaging ?No results found for this or any previous visit (from the past 24 hour(s)). ? ?No results found. ? ?Health Maintenance Due  ?Topic Date Due  ? HPV VACCINES (1 - 2-dose series) Never done  ? ? ? ? ?Assessment & Plan:  ? ?1. Encounter for postoperative care   ? ?-patient doing well; no complaints. SHe reports no pain in abdomen, pelvis and only occasional back pain.  ?-BP normal; she declines any other screening or testing today.  ?Approximately 10 minutes of total time was spent with this patient on discussion and exam.  ? ?Marylene Land, CNM ?06/01/2021 ?3:39 PM ? ?

## 2021-07-19 ENCOUNTER — Telehealth: Payer: Self-pay | Admitting: General Practice

## 2021-07-19 NOTE — Telephone Encounter (Signed)
Patient called into front office reporting bleeding since her hysterectomy. Patient states she had a hysterectomy 1/31. She became sexually active again since surgery the beginning of April. She states in the past 2 weeks she has started to have bleeding like a period after intercourse. Reports the bleeding lasts about half a day before going away. Patient denies pain. Reviewed with Dr Kennon Rounds who states granulation tissue could be present but patient will need exam for evaluation, urgent appt not needed. Discussed with patient and provided reassurance. Advised someone from the front office will call with a follow up appt. Patient verbalized understanding. ?

## 2021-08-01 ENCOUNTER — Other Ambulatory Visit: Payer: Self-pay

## 2021-08-01 ENCOUNTER — Ambulatory Visit
Admission: EM | Admit: 2021-08-01 | Discharge: 2021-08-01 | Disposition: A | Discharge status: Home / Self Care | Attending: Student | Admitting: Student

## 2021-08-01 ENCOUNTER — Encounter: Payer: Self-pay | Admitting: Emergency Medicine

## 2021-08-01 DIAGNOSIS — J020 Streptococcal pharyngitis: Secondary | ICD-10-CM | POA: Diagnosis not present

## 2021-08-01 MED ORDER — AMOXICILLIN 500 MG PO CAPS: 500.0000 mg | ORAL_CAPSULE | Freq: Two times a day (BID) | ORAL | 0 refills | Status: AC

## 2021-08-01 NOTE — ED Triage Notes (Signed)
Pt here for sore throat and some cough x 3 days; denies fever

## 2021-08-01 NOTE — ED Provider Notes (Signed)
[name]    CSN: [id number] Arrival date & time: 08/01/21  0801      History   Chief Complaint Chief Complaint  Patient presents with   Sore Throat    HPI Bryah Ocheltree Hartline is a 26 y.o. female presenting with sore throat for 3 days.  History allergic rhinitis.  Describes sore throat, pain with swallowing.  States she is clearing her throat, but denies acute or productive cough.  She is tolerating fluids, but pain with eating.  Has attempted pseudoephedrine with minimal relief.  Denies fever/chills, nausea, vomiting, shortness of breath, chest pain, unusual rashes.  HPI  Past Medical History:  Diagnosis Date   Allergy    Anemia    Anxiety    Asthma    Depression    GERD (gastroesophageal reflux disease)    Menometrorrhagia    MRSA (methicillin resistant staph aureus) culture positive 2012   culture from abscess   Positive GBS test 11/23/2015   Vaginal delivery 07/20/2013    Patient Active Problem List   Diagnosis Date Noted   Abnormal uterine bleeding 04/11/2021   Status post hysterectomy 04/11/2021   Endometriosis 09/02/2019   Vitamin D deficiency 11/23/2015   Asthma 11/23/2015   Drug allergy--prednisone 11/23/2015   Menorrhagia     Past Surgical History:  Procedure Laterality Date   LAPAROSCOPY ABDOMEN DIAGNOSTIC     NO PAST SURGERIES     VAGINAL HYSTERECTOMY Bilateral 04/11/2021   Procedure: HYSTERECTOMY VAGINAL WITH BILATERAL SALPINGECTOMY;  Surgeon: [name], MD;  Location: [facility] OR;  Service: Gynecology;  Laterality: Bilateral;   WISDOM TOOTH EXTRACTION Bilateral     OB History     Gravida  2   Para  2   Term  2   Preterm  0   AB  0   Living  2      SAB  0   IAB  0   Ectopic  0   Multiple  0   Live Births  2            Home Medications    Prior to Admission medications   Medication Sig Start Date End Date Taking? Authorizing Provider  amoxicillin (AMOXIL) 500 MG capsule Take 1 capsule (500 mg total)  by mouth in the morning and at bedtime for 10 days. 08/01/21 08/11/21 Yes Rhys Martini, PA-C  albuterol (PROVENTIL) (2.5 MG/3ML) 0.083% nebulizer solution Take 3 mLs (2.5 mg total) by nebulization every 6 (six) hours as needed for wheezing or shortness of breath. 05/12/20   Grayce Sessions, NP  EPINEPHrine 0.3 mg/0.3 mL IJ SOAJ injection Inject 0.3 mg into the muscle as needed for anaphylaxis. 06/21/20   [provider]  fluticasone (FLONASE) 50 MCG/ACT nasal spray Place 2 sprays into both nostrils daily.    [provider]  fluticasone (FLOVENT HFA) 110 MCG/ACT inhaler Inhale 2 puffs into the lungs 2 (two) times daily. 03/22/20   Alfonse Spruce, MD  Fluticasone-Salmeterol (ADVAIR) 500-50 MCG/DOSE AEPB Inhale 2 puffs into the lungs as needed. 10/13/20   Particia Nearing, PA-C  pantoprazole (PROTONIX) 40 MG tablet Take 1 tablet (40 mg total) by mouth daily. Patient not taking: Reported on 03/30/2021 06/21/20 07/21/20  Alfonse Spruce, MD  famotidine (PEPCID) 20 MG tablet Take 1 tablet (20 mg total) by mouth 2 (two) times daily. 05/29/19 10/29/19  Belinda Fisher, PA-C    Family History Family History  Problem Relation Age of Onset  Breast cancer Maternal Grandmother    Cancer Maternal Grandmother 40       breast cancer   Hearing loss Maternal Grandmother    Asthma Father    Asthma Mother    Hypertension Mother    Asthma Sister        2 sisters   Asthma Other     Social History Social History   Tobacco Use   Smoking status: Every Day    Types: Cigars   Smokeless tobacco: Never   Tobacco comments:    Smokes Black & Milds  Vaping Use   Vaping Use: Never used  Substance Use Topics   Alcohol use: Yes    Alcohol/week: 4.0 standard drinks    Types: 4 Standard drinks or equivalent per week   Drug use: No     Allergies   Montelukast sodium, Prednisone, and Tessalon [benzonatate]   Review of Systems Review of Systems  Constitutional:  Negative for  appetite change, chills and fever.  HENT:  Positive for sore throat. Negative for congestion, ear pain, rhinorrhea, sinus pressure and sinus pain.   Eyes:  Negative for redness and visual disturbance.  Respiratory:  Negative for cough, chest tightness, shortness of breath and wheezing.   Cardiovascular:  Negative for chest pain and palpitations.  Gastrointestinal:  Negative for abdominal pain, constipation, diarrhea, nausea and vomiting.  Genitourinary:  Negative for dysuria, frequency and urgency.  Musculoskeletal:  Negative for myalgias.  Neurological:  Negative for dizziness, weakness and headaches.  Psychiatric/Behavioral:  Negative for confusion.   All other systems reviewed and are negative.   Physical Exam Triage Vital Signs ED Triage Vitals  Enc Vitals Group     BP      Pulse      Resp      Temp      Temp src      SpO2      Weight      Height      Head Circumference      Peak Flow      Pain Score      Pain Loc      Pain Edu?      Excl. in GC?    No data found.  Updated Vital Signs BP 118/77 (BP Location: Left Arm)   Pulse 82   Temp 98.2 F (36.8 C) (Oral)   Resp 18   LMP  (LMP Unknown) Comment: Has had continually bleeding  SpO2 98%   Visual Acuity Right Eye Distance:   Left Eye Distance:   Bilateral Distance:    Right Eye Near:   Left Eye Near:    Bilateral Near:     Physical Exam Vitals reviewed.  Constitutional:      Appearance: Normal appearance. She is not ill-appearing.  HENT:     Head: Normocephalic and atraumatic.     Right Ear: Hearing, tympanic membrane, ear canal and external ear normal. No swelling or tenderness. No middle ear effusion. There is no impacted cerumen. No mastoid tenderness. Tympanic membrane is not injected, scarred, perforated, erythematous, retracted or bulging.     Left Ear: Hearing, tympanic membrane, ear canal and external ear normal. No swelling or tenderness.  No middle ear effusion. There is no impacted cerumen. No  mastoid tenderness. Tympanic membrane is not injected, scarred, perforated, erythematous, retracted or bulging.     Mouth/Throat:     Pharynx: Oropharynx is clear. Posterior oropharyngeal erythema present. No oropharyngeal exudate.     Tonsils: 2+ on  the right. 2+ on the left.     Comments: Erythema posterior pharynx. Tonsils 2+ bilaterally with erythema but no exudate. On exam, uvula is midline, she is tolerating her secretions without difficulty, there is no trismus, no drooling, she has normal phonation  Cardiovascular:     Rate and Rhythm: Normal rate and regular rhythm.     Heart sounds: Normal heart sounds.  Pulmonary:     Effort: Pulmonary effort is normal.     Breath sounds: Normal breath sounds.  Lymphadenopathy:     Cervical: No cervical adenopathy.  Neurological:     General: No focal deficit present.     Mental Status: She is alert and oriented to person, place, and time.  Psychiatric:        Mood and Affect: Mood normal.        Behavior: Behavior normal.        Thought Content: Thought content normal.        Judgment: Judgment normal.     UC Treatments / Results  Labs (all labs ordered are listed, but only abnormal results are displayed) Labs Reviewed - No data to display  EKG   Radiology No results found.  Procedures Procedures (including critical care time)  Medications Ordered in UC Medications - No data to display  Initial Impression / Assessment and Plan / UC Course  I have reviewed the triage vital signs and the nursing notes.  Pertinent labs & imaging results that were available during my care of the patient were reviewed by me and considered in my medical decision making (see chart for details).     This patient is a very pleasant 26 y.o. year old female presenting with strep pharyngitis. Afebrile, nontachy.  Today on exam there is no asymmetry, low suspicion for deep space infection.  No evidence of bacteremia, sepsis.  Amoxicillin sent .  ED  return precautions discussed. Patient verbalizes understanding and agreement.    Final Clinical Impressions(s) / UC Diagnoses   Final diagnoses:  Strep pharyngitis     Discharge Instructions      -Start the antibiotic-Amoxicillin, 1 pill every 12 hours for 10 days.  You can take this with food like with breakfast and dinner. -You can continue tylenol/ibuprofen for discomfort, and make sure to drink plenty of fluids -You'll still be contagious for 24 hours after starting the antibiotic. This means you can go back to work in 1 day.  -Make sure to throw out your toothbrush after 24 hours so you don't give the strep back to yourself.  -Seek additional medical attention if symptoms are getting worse instead of better- trouble swallowing, shortness of breath, voice changes, etc.      ED Prescriptions     Medication Sig Dispense Auth. Provider   amoxicillin (AMOXIL) 500 MG capsule Take 1 capsule (500 mg total) by mouth in the morning and at bedtime for 10 days. 20 capsule Rhys MartiniGraham, Conleigh Heinlein E, PA-C      PDMP not reviewed this encounter.   Rhys MartiniGraham, Milan Clare E, PA-C 08/01/21 [phone]

## 2021-08-01 NOTE — Discharge Instructions (Addendum)
-  Start the antibiotic-Amoxicillin, 1 pill every 12 hours for 10 days.  You can take this with food like with breakfast and dinner. ?-You can continue tylenol/ibuprofen for discomfort, and make sure to drink plenty of fluids ?-You'll still be contagious for 24 hours after starting the antibiotic. This means you can go back to work in 1 day.  ?-Make sure to throw out your toothbrush after 24 hours so you don't give the strep back to yourself.  ?-Seek additional medical attention if symptoms are getting worse instead of better- trouble swallowing, shortness of breath, voice changes, etc. ? ?

## 2021-08-26 ENCOUNTER — Other Ambulatory Visit: Payer: Self-pay

## 2021-08-26 ENCOUNTER — Emergency Department (HOSPITAL_COMMUNITY)
Admission: EM | Admit: 2021-08-26 | Discharge: 2021-08-26 | Disposition: A | Discharge status: Home / Self Care | Attending: Emergency Medicine | Admitting: Emergency Medicine

## 2021-08-26 ENCOUNTER — Emergency Department (HOSPITAL_COMMUNITY)

## 2021-08-26 ENCOUNTER — Encounter (HOSPITAL_COMMUNITY): Payer: Self-pay | Admitting: *Deleted

## 2021-08-26 DIAGNOSIS — M542 Cervicalgia: Secondary | ICD-10-CM | POA: Insufficient documentation

## 2021-08-26 DIAGNOSIS — M25512 Pain in left shoulder: Secondary | ICD-10-CM | POA: Diagnosis not present

## 2021-08-26 DIAGNOSIS — R200 Anesthesia of skin: Secondary | ICD-10-CM | POA: Diagnosis not present

## 2021-08-26 DIAGNOSIS — J45909 Unspecified asthma, uncomplicated: Secondary | ICD-10-CM | POA: Diagnosis not present

## 2021-08-26 DIAGNOSIS — Y92410 Unspecified street and highway as the place of occurrence of the external cause: Secondary | ICD-10-CM | POA: Diagnosis not present

## 2021-08-26 DIAGNOSIS — R202 Paresthesia of skin: Secondary | ICD-10-CM | POA: Insufficient documentation

## 2021-08-26 DIAGNOSIS — M545 Low back pain, unspecified: Secondary | ICD-10-CM | POA: Insufficient documentation

## 2021-08-26 DIAGNOSIS — R519 Headache, unspecified: Secondary | ICD-10-CM | POA: Diagnosis present

## 2021-08-26 LAB — CBC WITH DIFFERENTIAL/PLATELET
Abs Immature Granulocytes: 0 10*3/uL (ref 0.00–0.07)
Basophils Absolute: 0 10*3/uL (ref 0.0–0.1)
Basophils Relative: 0 %
Eosinophils Absolute: 0 10*3/uL (ref 0.0–0.5)
Eosinophils Relative: 1 %
HCT: 46.4 % — ABNORMAL HIGH (ref 36.0–46.0)
Hemoglobin: 14.7 g/dL (ref 12.0–15.0)
Immature Granulocytes: 0 %
Lymphocytes Relative: 27 %
Lymphs Abs: 1.3 10*3/uL (ref 0.7–4.0)
MCH: 26.8 pg (ref 26.0–34.0)
MCHC: 31.7 g/dL (ref 30.0–36.0)
MCV: 84.5 fL (ref 80.0–100.0)
Monocytes Absolute: 0.4 10*3/uL (ref 0.1–1.0)
Monocytes Relative: 8 %
Neutro Abs: 2.9 10*3/uL (ref 1.7–7.7)
Neutrophils Relative %: 64 %
Platelets: 206 10*3/uL (ref 150–400)
RBC: 5.49 MIL/uL — ABNORMAL HIGH (ref 3.87–5.11)
RDW: 15 % (ref 11.5–15.5)
WBC: 4.6 10*3/uL (ref 4.0–10.5)
nRBC: 0 % (ref 0.0–0.2)

## 2021-08-26 LAB — BASIC METABOLIC PANEL
Anion gap: 7 (ref 5–15)
BUN: 7 mg/dL (ref 6–20)
CO2: 21 mmol/L — ABNORMAL LOW (ref 22–32)
Calcium: 9.5 mg/dL (ref 8.9–10.3)
Chloride: 111 mmol/L (ref 98–111)
Creatinine, Ser: 0.76 mg/dL (ref 0.44–1.00)
GFR, Estimated: >60 mL/min (ref >60)
Glucose, Bld: 89 mg/dL (ref 70–99)
Potassium: 3.9 mmol/L (ref 3.5–5.1)
Sodium: 139 mmol/L (ref 135–145)

## 2021-08-26 MED ORDER — HYDROCODONE-ACETAMINOPHEN 5-325 MG PO TABS
1.0000 | ORAL_TABLET | Freq: Once | ORAL | Status: AC
  Administered 2021-08-26: 1 via ORAL
  Filled 2021-08-26: qty 1

## 2021-08-26 MED ORDER — FENTANYL CITRATE PF 50 MCG/ML IJ SOSY
50.0000 ug | PREFILLED_SYRINGE | Freq: Once | INTRAMUSCULAR | Status: AC
  Administered 2021-08-26: 50 ug via INTRAVENOUS
  Filled 2021-08-26: qty 1

## 2021-08-26 MED ORDER — NAPROXEN 500 MG PO TABS: 500.0000 mg | ORAL_TABLET | Freq: Two times a day (BID) | ORAL | 0 refills | Status: DC

## 2021-08-26 MED ORDER — METHOCARBAMOL 500 MG PO TABS: 500.0000 mg | ORAL_TABLET | Freq: Two times a day (BID) | ORAL | 0 refills | Status: DC

## 2021-08-26 MED ORDER — METHOCARBAMOL 500 MG PO TABS
500.0000 mg | ORAL_TABLET | Freq: Once | ORAL | Status: AC
  Administered 2021-08-26: 500 mg via ORAL
  Filled 2021-08-26: qty 1

## 2021-08-26 MED ORDER — KETOROLAC TROMETHAMINE 30 MG/ML IJ SOLN: 15.0000 mg | Freq: Once | INTRAMUSCULAR | Status: DC

## 2021-08-26 MED ORDER — METHOCARBAMOL 500 MG PO TABS: 1000.0000 mg | ORAL_TABLET | Freq: Once | ORAL | Status: DC
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
## 2021-08-29 ENCOUNTER — Ambulatory Visit (INDEPENDENT_AMBULATORY_CARE_PROVIDER_SITE_OTHER): Admitting: Neurology

## 2021-08-29 ENCOUNTER — Encounter: Payer: Self-pay | Admitting: Neurology

## 2021-08-29 ENCOUNTER — Telehealth: Payer: Self-pay | Admitting: Neurology

## 2021-08-29 VITALS — BP 113/74 | HR 64 | Ht 65.0 in | Wt 177.0 lb

## 2021-08-29 DIAGNOSIS — R202 Paresthesia of skin: Secondary | ICD-10-CM

## 2021-08-29 DIAGNOSIS — M79622 Pain in left upper arm: Secondary | ICD-10-CM

## 2021-08-29 DIAGNOSIS — S4992XA Unspecified injury of left shoulder and upper arm, initial encounter: Secondary | ICD-10-CM | POA: Diagnosis not present

## 2021-08-29 DIAGNOSIS — R29898 Other symptoms and signs involving the musculoskeletal system: Secondary | ICD-10-CM | POA: Diagnosis not present

## 2021-08-29 DIAGNOSIS — R200 Anesthesia of skin: Secondary | ICD-10-CM

## 2021-08-29 DIAGNOSIS — S143XXA Injury of brachial plexus, initial encounter: Secondary | ICD-10-CM | POA: Diagnosis not present

## 2021-08-29 MED ORDER — METHYLPREDNISOLONE 4 MG PO TBPK: ORAL_TABLET | ORAL | 1 refills | Status: DC

## 2021-08-29 MED ORDER — NAPROXEN 500 MG PO TABS: 500.0000 mg | ORAL_TABLET | Freq: Two times a day (BID) | ORAL | 6 refills | Status: DC

## 2021-08-29 MED ORDER — METHOCARBAMOL 500 MG PO TABS: 500.0000 mg | ORAL_TABLET | Freq: Two times a day (BID) | ORAL | 1 refills | Status: DC
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
## 2021-08-29 NOTE — Patient Instructions (Signed)
Physical therapy left arm MRI chest to check for injury to the left brachial plexus One week of steroids - methylprednisolone (stop for any swelling) Continue muscle relaxer After complete the steroids, restart the naprosyn Return in 6 weeks and if not improved can consider emg/ncs

## 2021-08-29 NOTE — Telephone Encounter (Signed)
  Pt just left appt with Dr. Lucia Gaskins, went to rehab next door to schedule appt per [facility] request.   Rehab stated Dr. Lucia Gaskins needs to change the refferal to say OT instead of physical therapy so they can make appt with pt.  Rehab States they also have sent a message to Dr. Lucia Gaskins.

## 2021-09-11 ENCOUNTER — Ambulatory Visit: Attending: Neurology | Admitting: Occupational Therapy

## 2021-09-11 ENCOUNTER — Ambulatory Visit (INDEPENDENT_AMBULATORY_CARE_PROVIDER_SITE_OTHER): Admitting: Family Medicine

## 2021-09-11 ENCOUNTER — Encounter: Payer: Self-pay | Admitting: Family Medicine

## 2021-09-11 ENCOUNTER — Other Ambulatory Visit (HOSPITAL_COMMUNITY)
Admission: RE | Admit: 2021-09-11 | Discharge: 2021-09-11 | Disposition: A | Discharge status: Home / Self Care | Payer: Medicaid Other | Source: Ambulatory Visit | Attending: Family Medicine | Admitting: Family Medicine

## 2021-09-11 ENCOUNTER — Encounter: Payer: Self-pay | Admitting: Occupational Therapy

## 2021-09-11 VITALS — BP 128/82 | HR 66 | Wt 172.1 lb

## 2021-09-11 DIAGNOSIS — R208 Other disturbances of skin sensation: Secondary | ICD-10-CM | POA: Diagnosis not present

## 2021-09-11 DIAGNOSIS — N939 Abnormal uterine and vaginal bleeding, unspecified: Secondary | ICD-10-CM | POA: Diagnosis present

## 2021-09-11 DIAGNOSIS — R278 Other lack of coordination: Secondary | ICD-10-CM | POA: Diagnosis present

## 2021-09-11 DIAGNOSIS — M6281 Muscle weakness (generalized): Secondary | ICD-10-CM | POA: Diagnosis present

## 2021-09-11 DIAGNOSIS — M79602 Pain in left arm: Secondary | ICD-10-CM | POA: Diagnosis present

## 2021-09-11 NOTE — Progress Notes (Cosign Needed)
  History:  Ms. Dorann Davidson Colasurdo is a 26 y.o. [id number] who presents to clinic today for vaginal bleeding after intercourse.   The following portions of the patient's history were reviewed and updated as appropriate: allergies, current medications, family history, past medical history, social history, past surgical history and problem list.  Review of Systems:  Review of Systems  Constitutional:  Negative for chills and fever.  Gastrointestinal:  Negative for abdominal pain.  Genitourinary:  Negative for hematuria.       Endorses bleeding and cramping after intercourse  Neurological:  Negative for dizziness.      Objective:  Physical Exam BP 128/82   Pulse 66   Wt 172 lb 1.6 oz (78.1 kg)   LMP  (LMP Unknown) Comment: Has had continually bleeding  BMI 28.64 kg/m  Physical Exam Exam conducted with a chaperone present.  Constitutional:      General: She is not in acute distress.    Appearance: Normal appearance. She is normal weight. She is not toxic-appearing.  HENT:     Head: Normocephalic and atraumatic.  Pulmonary:     Effort: Pulmonary effort is normal.  Genitourinary:    General: Normal vulva.     Exam position: Lithotomy position.     Pubic Area: No rash or pubic lice.      Tanner stage (genital): 5.     Vagina: Normal. No signs of injury. No vaginal discharge, bleeding or lesions.     Uterus: Absent.   Musculoskeletal:     Cervical back: Normal range of motion and neck supple.  Neurological:     Mental Status: She is alert and oriented to person, place, and time.  Psychiatric:        Mood and Affect: Mood normal.        Behavior: Behavior normal.       Labs and Imaging No results found for this or any previous visit (from the past 24 hour(s)).  No results found.  Health Maintenance Due  Topic Date Due   HPV VACCINES (1 - 2-dose series) Never done    Labs, imaging and previous visits in Epic and Care Everywhere reviewed  Assessment & Plan:   Vaginal bleeding: There is no bleeding present on speculum examination, no granulated tissue or other trauma noted. At this time there is no identifiable source of vaginal bleeding.   Report to ED if bleeding becomes heavy and constant, if severe abdominal pain, fever or chills.   Approximately 20 minutes of total time was spent with this patient.  Cheri Fowler, Student-PA 09/11/2021 2:05 PM
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
## 2021-09-13 ENCOUNTER — Other Ambulatory Visit: Payer: Self-pay | Admitting: Family Medicine

## 2021-09-13 ENCOUNTER — Encounter: Payer: Self-pay | Admitting: Family Medicine

## 2021-09-13 DIAGNOSIS — R531 Weakness: Secondary | ICD-10-CM

## 2021-09-13 DIAGNOSIS — M542 Cervicalgia: Secondary | ICD-10-CM

## 2021-09-13 DIAGNOSIS — R200 Anesthesia of skin: Secondary | ICD-10-CM

## 2021-09-15 LAB — CERVICOVAGINAL ANCILLARY ONLY
Bacterial Vaginitis (gardnerella): POSITIVE — AB
Chlamydia: NEGATIVE
Comment: NEGATIVE
Comment: NEGATIVE
Comment: NEGATIVE
Comment: NEGATIVE
Comment: NEGATIVE
Comment: NORMAL
Neisseria Gonorrhea: NEGATIVE

## 2021-09-15 MED ORDER — METRONIDAZOLE 500 MG PO TABS: 500.0000 mg | ORAL_TABLET | Freq: Two times a day (BID) | ORAL | 0 refills | Status: DC

## 2021-09-15 NOTE — Addendum Note (Signed)
Addended by: [name] on: 09/15/2021 12:01 PM   Modules accepted: Orders

## 2021-09-20 ENCOUNTER — Encounter: Payer: Self-pay | Admitting: Occupational Therapy

## 2021-09-20 ENCOUNTER — Ambulatory Visit: Admitting: Occupational Therapy

## 2021-09-20 DIAGNOSIS — R208 Other disturbances of skin sensation: Secondary | ICD-10-CM

## 2021-09-20 DIAGNOSIS — R278 Other lack of coordination: Secondary | ICD-10-CM

## 2021-09-20 DIAGNOSIS — M6281 Muscle weakness (generalized): Secondary | ICD-10-CM

## 2021-09-20 DIAGNOSIS — M79602 Pain in left arm: Secondary | ICD-10-CM
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
## 2021-09-24 ENCOUNTER — Ambulatory Visit
Admission: RE | Admit: 2021-09-24 | Discharge: 2021-09-24 | Disposition: A | Discharge status: Home / Self Care | Source: Ambulatory Visit | Attending: Family Medicine | Admitting: Family Medicine

## 2021-09-24 ENCOUNTER — Ambulatory Visit
Admission: RE | Admit: 2021-09-24 | Discharge: 2021-09-24 | Disposition: A | Discharge status: Home / Self Care | Source: Ambulatory Visit | Attending: Neurology | Admitting: Neurology

## 2021-09-24 DIAGNOSIS — R200 Anesthesia of skin: Secondary | ICD-10-CM

## 2021-09-24 DIAGNOSIS — R531 Weakness: Secondary | ICD-10-CM

## 2021-09-24 DIAGNOSIS — M542 Cervicalgia: Secondary | ICD-10-CM

## 2021-09-24 DIAGNOSIS — M79622 Pain in left upper arm: Secondary | ICD-10-CM

## 2021-09-24 DIAGNOSIS — R29898 Other symptoms and signs involving the musculoskeletal system: Secondary | ICD-10-CM

## 2021-09-24 DIAGNOSIS — S4992XA Unspecified injury of left shoulder and upper arm, initial encounter: Secondary | ICD-10-CM

## 2021-09-24 DIAGNOSIS — S143XXA Injury of brachial plexus, initial encounter: Secondary | ICD-10-CM

## 2021-09-24 MED ORDER — GADOBENATE DIMEGLUMINE 529 MG/ML IV SOLN
17.0000 mL | Freq: Once | INTRAVENOUS | Status: AC | PRN
  Administered 2021-09-24: 17 mL via INTRAVENOUS

## 2021-09-25 ENCOUNTER — Ambulatory Visit: Admitting: Occupational Therapy

## 2021-09-27 ENCOUNTER — Encounter: Payer: Self-pay | Admitting: Occupational Therapy

## 2021-09-27 ENCOUNTER — Ambulatory Visit: Admitting: Occupational Therapy

## 2021-09-27 DIAGNOSIS — R208 Other disturbances of skin sensation: Secondary | ICD-10-CM | POA: Diagnosis not present

## 2021-09-27 DIAGNOSIS — M79602 Pain in left arm: Secondary | ICD-10-CM

## 2021-09-27 DIAGNOSIS — R278 Other lack of coordination: Secondary | ICD-10-CM

## 2021-09-27 DIAGNOSIS — M6281 Muscle weakness (generalized): Secondary | ICD-10-CM
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
## 2021-10-02 ENCOUNTER — Encounter: Admitting: Occupational Therapy

## 2021-10-10 ENCOUNTER — Encounter: Admitting: Occupational Therapy

## 2021-10-11 ENCOUNTER — Encounter: Payer: Self-pay | Admitting: Family Medicine

## 2021-10-11 ENCOUNTER — Ambulatory Visit (INDEPENDENT_AMBULATORY_CARE_PROVIDER_SITE_OTHER): Payer: Medicaid Other | Admitting: Family Medicine

## 2021-10-11 VITALS — BP 124/70 | HR 57 | Ht 66.0 in | Wt 174.0 lb

## 2021-10-11 DIAGNOSIS — R202 Paresthesia of skin: Secondary | ICD-10-CM | POA: Diagnosis not present

## 2021-10-11 DIAGNOSIS — R29898 Other symptoms and signs involving the musculoskeletal system: Secondary | ICD-10-CM

## 2021-10-11 DIAGNOSIS — R200 Anesthesia of skin: Secondary | ICD-10-CM

## 2021-10-11 DIAGNOSIS — S4992XA Unspecified injury of left shoulder and upper arm, initial encounter: Secondary | ICD-10-CM | POA: Diagnosis not present

## 2021-10-11 MED ORDER — GABAPENTIN 300 MG PO CAPS: 300.0000 mg | ORAL_CAPSULE | Freq: Every day | ORAL | 1 refills | Status: DC
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
## 2021-10-11 NOTE — Patient Instructions (Signed)
Below is our plan:  We will try gabapentin 300mg  at bedtime for continued arm pain. I am referring you to NS for consideration of injection therapy for cervical disc bulge and stenosis.   Please make sure you are staying well hydrated. I recommend 50-60 ounces daily. Well balanced diet and regular exercise encouraged. Consistent sleep schedule with 6-8 hours recommended.   Please continue follow up with care team as directed.   Follow up with me in 6 months   You may receive a survey regarding today's visit. I encourage you to leave honest feed back as I do use this information to improve patient care. Thank you for seeing me today!

## 2021-10-12 ENCOUNTER — Ambulatory Visit: Payer: Medicaid Other | Admitting: Occupational Therapy

## 2021-10-12 ENCOUNTER — Telehealth: Payer: Self-pay | Admitting: Family Medicine

## 2021-10-12 NOTE — Telephone Encounter (Signed)
Referral sent to [facility] [phone] 

## 2021-10-16 NOTE — Progress Notes (Signed)
[facility] is assigning someone to read her Chest MRI.

## 2021-10-17 ENCOUNTER — Encounter: Payer: Self-pay | Admitting: *Deleted

## 2021-10-19 ENCOUNTER — Ambulatory Visit: Admitting: Family Medicine

## 2021-10-23 ENCOUNTER — Telehealth: Payer: Self-pay | Admitting: Neurology
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
## 2021-11-01 ENCOUNTER — Ambulatory Visit
Admission: RE | Admit: 2021-11-01 | Discharge: 2021-11-01 | Disposition: A | Discharge status: Home / Self Care | Payer: Medicaid Other | Source: Ambulatory Visit | Attending: Family Medicine | Admitting: Family Medicine

## 2021-11-01 VITALS — BP 120/77 | HR 78 | Temp 98.6°F | Resp 18

## 2021-11-01 DIAGNOSIS — N3090 Cystitis, unspecified without hematuria: Secondary | ICD-10-CM | POA: Insufficient documentation

## 2021-11-01 LAB — POCT URINALYSIS DIP (MANUAL ENTRY)
Bilirubin, UA: NEGATIVE
Glucose, UA: NEGATIVE mg/dL
Ketones, POC UA: NEGATIVE mg/dL
Nitrite, UA: NEGATIVE
Protein Ur, POC: NEGATIVE mg/dL
Spec Grav, UA: 1.015 (ref 1.010–1.025)
Urobilinogen, UA: 0.2 E.U./dL
pH, UA: 7.5 (ref 5.0–8.0)

## 2021-11-01 MED ORDER — CEPHALEXIN 500 MG PO CAPS: 500.0000 mg | ORAL_CAPSULE | Freq: Two times a day (BID) | ORAL | 0 refills | Status: DC

## 2021-11-01 NOTE — ED Triage Notes (Signed)
Pt c/o dysuria, frequency, onset ~ Saturday. Denies pain currently.

## 2021-11-01 NOTE — ED Provider Notes (Signed)
[facility]    ASSESSMENT & PLAN:  1. Cystitis    Begin: Meds ordered this encounter  Medications   cephALEXin (KEFLEX) 500 MG capsule    Sig: Take 1 capsule (500 mg total) by mouth 2 (two) times daily.    Dispense:  10 capsule    Refill:  0   No signs of pyelonephritis. Urine culture sent. Will notify patient of any significant results. Will follow up with her PCP or here if not showing improvement over the next 48 hours, sooner if needed.  Outlined signs and symptoms indicating need for more acute intervention. Patient verbalized understanding. After Visit Summary given.  SUBJECTIVE:  Melanie Cain is a 26 y.o. female who complains of urinary frequency, urgency and dysuria for the past few days. Without associated flank pain, fever, chills, vaginal discharge or bleeding. Gross hematuria: not present. No specific aggravating or alleviating factors reported. No LE edema. Normal PO intake without n/v/d. Without specific abdominal pain. Ambulatory without difficulty. OTC treatment: none. H/O UTI: frequently.  LMP: No LMP recorded (lmp unknown). Patient has had a hysterectomy.  OBJECTIVE:  Vitals:   11/01/21 1657  BP: 120/77  Pulse: 78  Resp: 18  Temp: 98.6 F (37 C)  TempSrc: Oral  SpO2: 98%   General appearance: alert; no distress HENT: oropharynx: moist Lungs: unlabored respirations Abdomen: soft, non-tender Back: no CVA tenderness Extremities: no edema; symmetrical with no gross deformities Skin: warm and dry Neurologic: normal gait Psychological: alert and cooperative; normal mood and affect  Labs Reviewed  POCT URINALYSIS DIP (MANUAL ENTRY) - Abnormal; Notable for the following components:      Result Value   Blood, UA large (*)    Leukocytes, UA Moderate (2+) (*)    All other components within normal limits  URINE CULTURE    Allergies  Allergen Reactions   Montelukast Sodium Other (See Comments)    Reaction:  Hallucinations     Prednisone Swelling and Other (See Comments)    Reaction:  Tongue swelling   Tessalon [Benzonatate] Hives    Past Medical History:  Diagnosis Date   Allergy    Anemia    Anxiety    Asthma    Depression    GERD (gastroesophageal reflux disease)    Menometrorrhagia    MRSA (methicillin resistant staph aureus) culture positive 2012   culture from abscess   Positive GBS test 11/23/2015   Vaginal delivery 07/20/2013   Social History   Socioeconomic History   Marital status: Single    Spouse name: Not on file   Number of children: Not on file   Years of education: Not on file   Highest education level: Not on file  Occupational History   Not on file  Tobacco Use   Smoking status: Every Day    Types: Cigars   Smokeless tobacco: Never   Tobacco comments:    Smokes Black & Milds  Vaping Use   Vaping Use: Never used  Substance and Sexual Activity   Alcohol use: Not Currently    Alcohol/week: 7.0 standard drinks of alcohol    Types: 2 Glasses of wine, 5 Shots of liquor per week   Drug use: No   Sexual activity: Yes    Birth control/protection: Pill  Other Topics Concern   Not on file  Social History Narrative   Not on file   Social Determinants of Health   Financial Resource Strain: Not on file  Food Insecurity: No Food Insecurity (06/01/2021)  Hunger Vital Sign    Worried About Running Out of Food in the Last Year: Never true    Ran Out of Food in the Last Year: Never true  Transportation Needs: No Transportation Needs (06/01/2021)   PRAPARE - Administrator, Civil Service (Medical): No    Lack of Transportation (Non-Medical): No  Physical Activity: Not on file  Stress: Not on file  Social Connections: Not on file  Intimate Partner Violence: Not on file   Family History  Problem Relation Age of Onset   Asthma Mother    Hypertension Mother    Asthma Father    Asthma Sister        2 sisters   Breast cancer Maternal Grandmother    Cancer Maternal  Grandmother 40       breast cancer   Hearing loss Maternal Grandmother    Asthma Other    Neuropathy Neg Hx    Stroke Neg Hx         Mardella Layman, MD 11/01/21 [phone]

## 2021-11-03 LAB — URINE CULTURE: Culture: 80000 — AB

## 2021-11-16 ENCOUNTER — Ambulatory Visit (INDEPENDENT_AMBULATORY_CARE_PROVIDER_SITE_OTHER): Payer: Medicaid Other | Admitting: General Practice

## 2021-11-16 ENCOUNTER — Other Ambulatory Visit (HOSPITAL_COMMUNITY)
Admission: RE | Admit: 2021-11-16 | Discharge: 2021-11-16 | Disposition: A | Discharge status: Home / Self Care | Payer: Medicaid Other | Source: Ambulatory Visit | Attending: Family Medicine | Admitting: Family Medicine

## 2021-11-16 ENCOUNTER — Telehealth: Payer: Self-pay | Admitting: Family Medicine

## 2021-11-16 ENCOUNTER — Ambulatory Visit: Payer: Self-pay

## 2021-11-16 VITALS — BP 123/80 | HR 59 | Ht 65.0 in | Wt 177.0 lb

## 2021-11-16 DIAGNOSIS — N898 Other specified noninflammatory disorders of vagina: Secondary | ICD-10-CM

## 2021-11-16 DIAGNOSIS — Z87440 Personal history of urinary (tract) infections: Secondary | ICD-10-CM

## 2021-11-16 DIAGNOSIS — N3000 Acute cystitis without hematuria: Secondary | ICD-10-CM

## 2021-11-16 MED ORDER — FLUCONAZOLE 150 MG PO TABS: 150.0000 mg | ORAL_TABLET | Freq: Once | ORAL | 0 refills | Status: AC

## 2021-11-16 NOTE — Telephone Encounter (Signed)
Patient came into office & had concerns addressed.

## 2021-11-16 NOTE — Progress Notes (Signed)
Patient presents to office today for self swab. Patient reports significant vaginal irritation and feeling uncomfortable since taking antibiotics two weeks ago for a UTI. She reports a history of frequent UTIs, yeast infections, & BV. She states she is still feeling some cramping with urination and difficulty emptying completely. Urine collected for repeat urine culture. Patient was instructed in self swab & specimen collected. Diflucan sent to pharmacy. Advised patient results will be available in mychart and we will reach out if any treatment is needed.  Chase Caller RN BSN 11/16/21

## 2021-11-16 NOTE — Telephone Encounter (Signed)
Patient called in saying she is having frequent utis and wants to be seen but were schedule out too far. She finished a whole course of antibiotics last week for a UTI but it came back.

## 2021-11-19 LAB — URINE CULTURE

## 2021-11-20 MED ORDER — CEFDINIR 300 MG PO CAPS: 300.0000 mg | ORAL_CAPSULE | Freq: Two times a day (BID) | ORAL | 0 refills | Status: AC

## 2021-11-20 NOTE — Addendum Note (Signed)
Addended by: [name] on: 11/20/2021 08:00 AM   Modules accepted: Orders

## 2021-11-21 LAB — CERVICOVAGINAL ANCILLARY ONLY
Bacterial Vaginitis (gardnerella): POSITIVE — AB
Candida Glabrata: NEGATIVE
Candida Vaginitis: POSITIVE — AB
Comment: NEGATIVE
Comment: NEGATIVE
Comment: NEGATIVE
Comment: NEGATIVE
Trichomonas: NEGATIVE

## 2021-11-22 MED ORDER — FLUCONAZOLE 150 MG PO TABS: 150.0000 mg | ORAL_TABLET | Freq: Every day | ORAL | 2 refills | Status: AC

## 2021-11-22 MED ORDER — METRONIDAZOLE 500 MG PO TABS: 500.0000 mg | ORAL_TABLET | Freq: Two times a day (BID) | ORAL | 0 refills | Status: AC

## 2021-11-22 NOTE — Addendum Note (Signed)
Addended by: [name] on: 11/22/2021 08:21 AM   Modules accepted: Orders

## 2022-04-23 ENCOUNTER — Ambulatory Visit: Admitting: Family Medicine

## 2022-04-26 ENCOUNTER — Ambulatory Visit (HOSPITAL_COMMUNITY): Payer: Self-pay

## 2022-04-27 ENCOUNTER — Ambulatory Visit
Admission: RE | Admit: 2022-04-27 | Discharge: 2022-04-27 | Disposition: A | Discharge status: Home / Self Care | Source: Ambulatory Visit | Attending: Physician Assistant | Admitting: Physician Assistant

## 2022-04-27 VITALS — BP 122/77 | HR 75 | Temp 98.5°F | Resp 16

## 2022-04-27 DIAGNOSIS — J069 Acute upper respiratory infection, unspecified: Secondary | ICD-10-CM | POA: Insufficient documentation

## 2022-04-27 DIAGNOSIS — Z20828 Contact with and (suspected) exposure to other viral communicable diseases: Secondary | ICD-10-CM | POA: Diagnosis present

## 2022-04-27 DIAGNOSIS — N3000 Acute cystitis without hematuria: Secondary | ICD-10-CM | POA: Insufficient documentation

## 2022-04-27 LAB — POCT URINALYSIS DIP (MANUAL ENTRY)
Bilirubin, UA: NEGATIVE
Glucose, UA: NEGATIVE mg/dL
Ketones, POC UA: NEGATIVE mg/dL
Nitrite, UA: NEGATIVE
Protein Ur, POC: NEGATIVE mg/dL
Spec Grav, UA: 1.02 (ref 1.010–1.025)
Urobilinogen, UA: 1 E.U./dL
pH, UA: 6 (ref 5.0–8.0)

## 2022-04-27 LAB — POCT RAPID STREP A (OFFICE): Rapid Strep A Screen: NEGATIVE

## 2022-04-27 LAB — POCT INFLUENZA A/B
Influenza A, POC: NEGATIVE
Influenza B, POC: NEGATIVE

## 2022-04-27 MED ORDER — AMOXICILLIN-POT CLAVULANATE 875-125 MG PO TABS: 1.0000 | ORAL_TABLET | Freq: Two times a day (BID) | ORAL | 0 refills | Status: DC

## 2022-04-27 MED ORDER — OSELTAMIVIR PHOSPHATE 75 MG PO CAPS: 75.0000 mg | ORAL_CAPSULE | Freq: Two times a day (BID) | ORAL | 0 refills | Status: DC

## 2022-04-27 NOTE — ED Triage Notes (Signed)
Cough, runny nose, constipation, headache, body aches, that started Wednesday. Kids were positive for flu and strep throat on Tuesday and Thursday this week. Patient states she is also having UTI symptoms of burning while urinating increase frequency, cramping that started Monday. Taking mucinex.

## 2022-04-27 NOTE — ED Provider Notes (Signed)
[facility]    CSN: [id number] Arrival date & time: 04/27/22  1536      History   Chief Complaint Chief Complaint  Patient presents with   Cough    Constipation... runny nose... UTI... sore throat - Entered by patient   Urinary Frequency    HPI Melanie Cain is a 27 y.o. female.   Patient here today for evaluation of runny nose, sore throat, cough that started recently.  She states that she has had exposure to both flu and strep through her children.  She also reports concerns for UTI given urinary frequency.  She denies any fever.  She has tried Mucinex with mild relief.  The history is provided by the patient.  Cough Associated symptoms: myalgias and sore throat   Associated symptoms: no chills, no ear pain, no eye discharge, no fever, no shortness of breath and no wheezing   Urinary Frequency Pertinent negatives include no abdominal pain and no shortness of breath.    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   Vitamin D deficiency 11/23/2015   Asthma 11/23/2015   Drug allergy--prednisone 11/23/2015    Past Surgical History:  Procedure Laterality Date   LAPAROSCOPY ABDOMEN DIAGNOSTIC     NO PAST SURGERIES     VAGINAL HYSTERECTOMY Bilateral 04/11/2021   Procedure: HYSTERECTOMY VAGINAL WITH BILATERAL SALPINGECTOMY;  Surgeon: Donnamae Jude, MD;  Location: [facility];  Service: Gynecology;  Laterality: Bilateral;   WISDOM TOOTH EXTRACTION Bilateral     OB History     Gravida  2   Para  2   Term  2   Preterm  0   AB  0   Living  2      SAB  0   IAB  0   Ectopic  0    Multiple  0   Live Births  2            Home Medications    Prior to Admission medications   Medication Sig Start Date End Date Taking? Authorizing Provider  amoxicillin-clavulanate (AUGMENTIN) 875-125 MG tablet Take 1 tablet by mouth every 12 (twelve) hours. 04/27/22  Yes Francene Finders, PA-C  oseltamivir (TAMIFLU) 75 MG capsule Take 1 capsule (75 mg total) by mouth every 12 (twelve) hours. 04/27/22  Yes Francene Finders, PA-C  albuterol (PROVENTIL) (2.5 MG/3ML) 0.083% nebulizer solution Take 3 mLs (2.5 mg total) by nebulization every 6 (six) hours as needed for wheezing or shortness of breath. 05/12/20   Kerin Perna, NP  EPINEPHrine 0.3 mg/0.3 mL IJ SOAJ injection Inject 0.3 mg into the muscle as needed for anaphylaxis. 06/21/20   [provider]  fluticasone (FLONASE) 50 MCG/ACT nasal spray Place 2 sprays into both nostrils daily.    [provider]  fluticasone (FLOVENT HFA) 110 MCG/ACT inhaler Inhale 2 puffs into the lungs 2 (two) times daily. 03/22/20   Valentina Shaggy, MD  Fluticasone-Salmeterol (ADVAIR) 500-50 MCG/DOSE AEPB Inhale 2 puffs into the lungs as needed. 10/13/20   Volney American, PA-C  gabapentin (NEURONTIN) 300 MG capsule Take 1 capsule (300 mg total) by mouth at bedtime. 10/11/21   Lomax, Amy, NP  naproxen (NAPROSYN) 500 MG tablet Take 1 tablet (500 mg total) by mouth 2 (two) times daily. 08/29/21   Melvenia Beam, MD  famotidine (PEPCID) 20 MG tablet Take 1 tablet (20 mg total) by mouth 2 (two) times daily. 05/29/19 10/29/19  Ok Edwards, PA-C    Family History Family History  Problem Relation Age of Onset   Asthma Mother    Hypertension Mother    Asthma Father    Asthma Sister        2 sisters   Breast cancer Maternal Grandmother    Cancer Maternal Grandmother 2       breast cancer   Hearing loss Maternal Grandmother    Asthma Other    Neuropathy Neg Hx    Stroke Neg Hx     Social History Social History   Tobacco Use    Smoking status: Every Day    Types: Cigars   Smokeless tobacco: Never   Tobacco comments:    Smokes Black & Milds  Vaping Use   Vaping Use: Never used  Substance Use Topics   Alcohol use: Not Currently    Alcohol/week: 7.0 standard drinks of alcohol    Types: 2 Glasses of wine, 5 Shots of liquor per week   Drug use: No     Allergies   Montelukast sodium, Prednisone, and Tessalon [benzonatate]   Review of Systems Review of Systems  Constitutional:  Negative for chills and fever.  HENT:  Positive for congestion and sore throat. Negative for ear pain.   Eyes:  Negative for discharge and redness.  Respiratory:  Positive for cough. Negative for shortness of breath and wheezing.   Gastrointestinal:  Negative for abdominal pain, diarrhea, nausea and vomiting.  Genitourinary:  Positive for frequency.  Musculoskeletal:  Positive for myalgias.     Physical Exam Triage Vital Signs ED Triage Vitals  Enc Vitals Group     BP 04/27/22 1636 122/77     Pulse Rate 04/27/22 1636 75     Resp 04/27/22 1636 16     Temp 04/27/22 1636 98.5 F (36.9 C)     Temp Source 04/27/22 1636 Oral     SpO2 04/27/22 1636 97 %     Weight --      Height --      Head Circumference --      Peak Flow --      Pain Score 04/27/22 1637 0     Pain Loc --      Pain Edu? --      Excl. in [place]? --    No data found.  Updated Vital Signs BP 122/77 (BP Location: Right Arm)   Pulse 75   Temp 98.5 F (36.9 C) (Oral)   Resp 16   LMP  (LMP Unknown) Comment: Has had continually bleeding  SpO2 97%      Physical Exam Vitals and nursing note reviewed.  Constitutional:      General: She is not in acute distress.    Appearance: Normal appearance. She is not ill-appearing.  HENT:     Head: Normocephalic and atraumatic.     Nose: Congestion present.     Mouth/Throat:     Mouth: Mucous membranes are moist.     Pharynx: No oropharyngeal exudate or posterior oropharyngeal erythema.  Eyes:      Conjunctiva/sclera: Conjunctivae normal.  Cardiovascular:     Rate and Rhythm: Normal rate and regular rhythm.     Heart sounds: Normal heart sounds. No murmur heard. Pulmonary:     Effort: Pulmonary effort is normal. No respiratory distress.     Breath sounds: Normal breath sounds. No wheezing, rhonchi or rales.  Skin:    General: Skin is warm and dry.  Neurological:     Mental Status: She is alert.  Psychiatric:        Mood and Affect: Mood normal.        Thought Content: Thought content normal.      UC Treatments / Results  Labs (all labs ordered are listed, but only abnormal results are displayed) Labs Reviewed  POCT URINALYSIS DIP (MANUAL ENTRY) - Abnormal; Notable for the following components:      Result Value   Clarity, UA cloudy (*)    Blood, UA trace-intact (*)    Leukocytes, UA Small (1+) (*)    All other components within normal limits  URINE CULTURE  POCT INFLUENZA A/B  POCT RAPID STREP A (OFFICE)    EKG   Radiology No results found.  Procedures Procedures (including critical care time)  Medications Ordered in UC Medications - No data to display  Initial Impression / Assessment and Plan / UC Course  I have reviewed the triage vital signs and the nursing notes.  Pertinent labs & imaging results that were available during my care of the patient were reviewed by me and considered in my medical decision making (see chart for details).  UA with findings concerning for possible UTI- augmentin prescribed and urine culture ordered. Strep screening and flu screening negative. Will treat to cover flu exposure.Patient declines covid screening. Encouraged follow up with any further concerns.      Final Clinical Impressions(s) / UC Diagnoses   Final diagnoses:  Acute cystitis without hematuria  Acute upper respiratory infection  Exposure to the flu   Discharge Instructions   None    ED Prescriptions     Medication Sig Dispense Auth. Provider    amoxicillin-clavulanate (AUGMENTIN) 875-125 MG tablet Take 1 tablet by mouth every 12 (twelve) hours. 14 tablet Ewell Poe F, PA-C   oseltamivir (TAMIFLU) 75 MG capsule Take 1 capsule (75 mg total) by mouth every 12 (twelve) hours. 10 capsule Francene Finders, PA-C      PDMP not reviewed this encounter.   Francene Finders, PA-C 04/27/22 1737

## 2022-04-29 LAB — URINE CULTURE: Culture: <10000 — AB

## 2022-06-22 ENCOUNTER — Other Ambulatory Visit: Payer: Self-pay

## 2022-06-22 ENCOUNTER — Ambulatory Visit (INDEPENDENT_AMBULATORY_CARE_PROVIDER_SITE_OTHER)

## 2022-06-22 ENCOUNTER — Other Ambulatory Visit (HOSPITAL_COMMUNITY)
Admission: RE | Admit: 2022-06-22 | Discharge: 2022-06-22 | Disposition: A | Discharge status: Home / Self Care | Source: Ambulatory Visit | Attending: Family Medicine | Admitting: Family Medicine

## 2022-06-22 VITALS — BP 115/74 | HR 62 | Wt 159.9 lb

## 2022-06-22 DIAGNOSIS — Z113 Encounter for screening for infections with a predominantly sexual mode of transmission: Secondary | ICD-10-CM | POA: Insufficient documentation

## 2022-06-22 DIAGNOSIS — R3915 Urgency of urination: Secondary | ICD-10-CM

## 2022-06-22 DIAGNOSIS — R339 Retention of urine, unspecified: Secondary | ICD-10-CM

## 2022-06-22 LAB — POCT URINALYSIS DIP (DEVICE)
Bilirubin Urine: NEGATIVE
Glucose, UA: NEGATIVE mg/dL
Ketones, ur: NEGATIVE mg/dL
Leukocytes,Ua: NEGATIVE
Nitrite: NEGATIVE
Protein, ur: NEGATIVE mg/dL
Specific Gravity, Urine: 1.025 (ref 1.005–1.030)
Urobilinogen, UA: 1 mg/dL (ref 0.0–1.0)
pH: 6 (ref 5.0–8.0)

## 2022-06-22 NOTE — Progress Notes (Signed)
Melanie Cain is here for STD screening and UTI check. Reports urgency and incomplete emptying of bladder. Reports these symptoms are intermittent and not bothersome. She is concerned this is the beginning of a UTI. UA shows trace hemoglobin. Urine sent for culture- will treat based on results.   Self swab instructions given and specimen obtained. Explained patient will be contacted with any abnormal results.   Marjo Bicker, RN 06/22/2022  10:17 AM

## 2022-06-23 LAB — RPR: RPR Ser Ql: NONREACTIVE

## 2022-06-23 LAB — HEPATITIS B SURFACE ANTIGEN: Hepatitis B Surface Ag: NEGATIVE

## 2022-06-23 LAB — HEPATITIS C ANTIBODY: Hep C Virus Ab: NONREACTIVE

## 2022-06-23 LAB — HIV ANTIBODY (ROUTINE TESTING W REFLEX): HIV Screen 4th Generation wRfx: NONREACTIVE

## 2022-06-25 LAB — URINE CULTURE: Organism ID, Bacteria: NO GROWTH

## 2022-06-27 LAB — CERVICOVAGINAL ANCILLARY ONLY
Chlamydia: NEGATIVE
Comment: NEGATIVE
Comment: NEGATIVE
Comment: NORMAL
Neisseria Gonorrhea: NEGATIVE

## 2022-09-15 ENCOUNTER — Other Ambulatory Visit: Payer: Self-pay

## 2022-09-15 ENCOUNTER — Ambulatory Visit
Admission: RE | Admit: 2022-09-15 | Discharge: 2022-09-15 | Disposition: A | Discharge status: Home / Self Care | Source: Ambulatory Visit | Attending: Internal Medicine | Admitting: Internal Medicine

## 2022-09-15 VITALS — BP 111/71 | HR 62 | Temp 98.1°F | Resp 18

## 2022-09-15 DIAGNOSIS — N3001 Acute cystitis with hematuria: Secondary | ICD-10-CM | POA: Diagnosis not present

## 2022-09-15 LAB — POCT URINALYSIS DIP (MANUAL ENTRY)
Bilirubin, UA: NEGATIVE
Glucose, UA: NEGATIVE mg/dL
Ketones, POC UA: NEGATIVE mg/dL
Nitrite, UA: NEGATIVE
Protein Ur, POC: NEGATIVE mg/dL
Spec Grav, UA: 1.015 (ref 1.010–1.025)
Urobilinogen, UA: 0.2 E.U./dL
pH, UA: 7.5 (ref 5.0–8.0)

## 2022-09-15 MED ORDER — CEPHALEXIN 500 MG PO CAPS: 500.0000 mg | ORAL_CAPSULE | Freq: Two times a day (BID) | ORAL | 0 refills | Status: AC

## 2022-09-15 NOTE — ED Provider Notes (Signed)
[name]    CSN: [id number] Arrival date & time: 09/15/22  1334      History   Chief Complaint Chief Complaint  Patient presents with   Urinary Frequency    Entered by patient    HPI Melanie Cain is a 27 y.o. female comes to urgent care with 1 week history of burning on urination, urinary frequency and vaginal irritation.  Patient denies any flank pain, nausea or vomiting.  No fever or chills.  Patient denies any abdominal pain.  No vaginal discharge.  Patient has a history of recurrent urinary tract infections.  No dizziness, near syncope or syncopal episodes. 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   Vitamin D deficiency 11/23/2015   Asthma 11/23/2015   Drug allergy--prednisone 11/23/2015    Past Surgical History:  Procedure Laterality Date   LAPAROSCOPY ABDOMEN DIAGNOSTIC     NO PAST SURGERIES     VAGINAL HYSTERECTOMY Bilateral 04/11/2021   Procedure: HYSTERECTOMY VAGINAL WITH BILATERAL SALPINGECTOMY;  Surgeon: [name], MD;  Location: [facility];  Service: Gynecology;  Laterality: Bilateral;   WISDOM TOOTH EXTRACTION Bilateral     OB History     Gravida  2   Para  2   Term  2   Preterm  0   AB  0   Living  2      SAB  0   IAB  0   Ectopic  0   Multiple  0   Live Births  2            Home Medications    Prior to Admission medications   Medication Sig Start Date End Date Taking? Authorizing Provider  cephALEXin (KEFLEX) 500 MG capsule Take 1 capsule (500 mg total) by mouth 2 (two) times daily for 5 days. 09/15/22 09/20/22 Yes Lounell Schumacher, Britta Mccreedy, MD   albuterol (PROVENTIL) (2.5 MG/3ML) 0.083% nebulizer solution Take 3 mLs (2.5 mg total) by nebulization every 6 (six) hours as needed for wheezing or shortness of breath. 05/12/20   Grayce Sessions, NP  EPINEPHrine 0.3 mg/0.3 mL IJ SOAJ injection Inject 0.3 mg into the muscle as needed for anaphylaxis. 06/21/20   [provider]  fluticasone (FLONASE) 50 MCG/ACT nasal spray Place 2 sprays into both nostrils daily.    [provider]  fluticasone (FLOVENT HFA) 110 MCG/ACT inhaler Inhale 2 puffs into the lungs 2 (two) times daily. 03/22/20   Alfonse Spruce, MD  Fluticasone-Salmeterol (ADVAIR) 500-50 MCG/DOSE AEPB Inhale 2 puffs into the lungs as needed. 10/13/20   Particia Nearing, PA-C  gabapentin (NEURONTIN) 300 MG capsule Take 1 capsule (300 mg total) by mouth at bedtime. Patient not taking: Reported on 06/22/2022 10/11/21   Shawnie Dapper, NP  naproxen (NAPROSYN) 500 MG tablet Take 1 tablet (500 mg total) by mouth 2 (two) times daily. 08/29/21   Anson Fret, MD  famotidine (PEPCID) 20 MG tablet Take 1 tablet (20 mg total) by mouth 2 (two) times daily. 05/29/19 10/29/19  Belinda Fisher, PA-C  Family History Family History  Problem Relation Age of Onset   Asthma Mother    Hypertension Mother    Asthma Father    Asthma Sister        2 sisters   Breast cancer Maternal Grandmother    Cancer Maternal Grandmother 40       breast cancer   Hearing loss Maternal Grandmother    Asthma Other    Neuropathy Neg Hx    Stroke Neg Hx     Social History Social History   Tobacco Use   Smoking status: Some Days    Types: Cigars   Smokeless tobacco: Never   Tobacco comments:    Smokes Black & Milds infrequently  Vaping Use   Vaping Use: Never used  Substance Use Topics   Alcohol use: Not Currently    Alcohol/week: 7.0 standard drinks of alcohol    Types: 2 Glasses of wine, 5 Shots of liquor per week   Drug use: No     Allergies   Montelukast sodium, Prednisone, and  Tessalon [benzonatate]   Review of Systems Review of Systems As per HPI  Physical Exam Triage Vital Signs ED Triage Vitals  Enc Vitals Group     BP 09/15/22 1428 111/71     Pulse Rate 09/15/22 1428 62     Resp 09/15/22 1428 18     Temp 09/15/22 1428 98.1 F (36.7 C)     Temp Source 09/15/22 1428 Oral     SpO2 09/15/22 1428 98 %     Weight --      Height --      Head Circumference --      Peak Flow --      Pain Score 09/15/22 1429 2     Pain Loc --      Pain Edu? --      Excl. in [place]? --    No data found.  Updated Vital Signs BP 111/71 (BP Location: Left Arm)   Pulse 62   Temp 98.1 F (36.7 C) (Oral)   Resp 18   LMP  (LMP Unknown) Comment: Has had continually bleeding  SpO2 98%   Visual Acuity Right Eye Distance:   Left Eye Distance:   Bilateral Distance:    Right Eye Near:   Left Eye Near:    Bilateral Near:     Physical Exam Vitals and nursing note reviewed.  Constitutional:      General: Melanie Cain is not in acute distress.    Appearance: Melanie Cain is not ill-appearing.  Cardiovascular:     Rate and Rhythm: Normal rate and regular rhythm.  Pulmonary:     Effort: Pulmonary effort is normal.     Breath sounds: Normal breath sounds.  Abdominal:     General: Bowel sounds are normal.     Palpations: Abdomen is soft.  Neurological:     Mental Status: Melanie Cain is alert.      UC Treatments / Results  Labs (all labs ordered are listed, but only abnormal results are displayed) Labs Reviewed  POCT URINALYSIS DIP (MANUAL ENTRY) - Abnormal; Notable for the following components:      Result Value   Color, UA light yellow (*)    Clarity, UA cloudy (*)    Blood, UA small (*)    Leukocytes, UA Small (1+) (*)    All other components within normal limits  URINE CULTURE    EKG   Radiology No results found.  Procedures Procedures (including critical care  time)  Medications Ordered in UC Medications - No data to display  Initial Impression / Assessment and Plan /  UC Course  I have reviewed the triage vital signs and the nursing notes.  Pertinent labs & imaging results that were available during my care of the patient were reviewed by me and considered in my medical decision making (see chart for details).     1.  Acute cystitis with hematuria: Point-of-care urinalysis is positive for leukocyte Estrace and blood Urine cultures have been sent Patient is advised to increase oral fluid intake Keflex 500 mg twice daily for 5 days Return precautions given. Final Clinical Impressions(s) / UC Diagnoses   Final diagnoses:  Acute cystitis with hematuria     Discharge Instructions      Please increase oral fluid intake Please take medications as directed Will send your urine off for cultures Will call you with recommendations if labs are abnormal Please return to urgent care if you have any other concerns.   ED Prescriptions     Medication Sig Dispense Auth. Provider   cephALEXin (KEFLEX) 500 MG capsule Take 1 capsule (500 mg total) by mouth 2 (two) times daily for 5 days. 10 capsule Greogory Cornette, Britta Mccreedy, MD      PDMP not reviewed this encounter.   Merrilee Jansky, MD 09/15/22 [id number]

## 2022-09-15 NOTE — ED Triage Notes (Signed)
Pt here for dysuria and some vaginal irritation x 1 week with hx of similar per pt

## 2022-09-15 NOTE — Discharge Instructions (Addendum)
Please increase oral fluid intake Please take medications as directed Will send your urine off for cultures Will call you with recommendations if labs are abnormal Please return to urgent care if you have any other concerns.

## 2022-09-16 LAB — URINE CULTURE: Culture: <10000 — AB

## 2022-09-18 ENCOUNTER — Other Ambulatory Visit: Payer: Self-pay

## 2022-09-18 ENCOUNTER — Other Ambulatory Visit (HOSPITAL_COMMUNITY)
Admission: RE | Admit: 2022-09-18 | Discharge: 2022-09-18 | Disposition: A | Discharge status: Home / Self Care | Source: Ambulatory Visit | Attending: Family Medicine | Admitting: Family Medicine

## 2022-09-18 ENCOUNTER — Ambulatory Visit (INDEPENDENT_AMBULATORY_CARE_PROVIDER_SITE_OTHER)

## 2022-09-18 VITALS — BP 119/69 | HR 56 | Ht 66.0 in | Wt 162.7 lb

## 2022-09-18 DIAGNOSIS — Z113 Encounter for screening for infections with a predominantly sexual mode of transmission: Secondary | ICD-10-CM | POA: Diagnosis not present

## 2022-09-18 NOTE — Patient Instructions (Addendum)
Urinary Tract Infection, Adult  A urinary tract infection (UTI) is an infection of any part of the urinary tract. The urinary tract includes the kidneys, ureters, bladder, and urethra. These organs make, store, and get rid of urine in the body. An upper UTI affects the ureters and kidneys. A lower UTI affects the bladder and urethra. What are the causes? Most urinary tract infections are caused by bacteria in your genital area around your urethra, where urine leaves your body. These bacteria grow and cause inflammation of your urinary tract. What increases the risk? You are more likely to develop this condition if: You have a urinary catheter that stays in place. You are not able to control when you urinate or have a bowel movement (incontinence). You are female and you: Use a spermicide or diaphragm for birth control. Have low estrogen levels. Are pregnant. You have certain genes that increase your risk. You are sexually active. You take antibiotic medicines. You have a condition that causes your flow of urine to slow down, such as: An enlarged prostate, if you are female. Blockage in your urethra. A kidney stone. A nerve condition that affects your bladder control (neurogenic bladder). Not getting enough to drink, or not urinating often. You have certain medical conditions, such as: Diabetes. A weak disease-fighting system (immunesystem). Sickle cell disease. Gout. Spinal cord injury. What are the signs or symptoms? Symptoms of this condition include: Needing to urinate right away (urgency). Frequent urination. This may include small amounts of urine each time you urinate. Pain or burning with urination. Blood in the urine. Urine that smells bad or unusual. Trouble urinating. Cloudy urine. Vaginal discharge, if you are female. Pain in the abdomen or the lower back. You may also have: Vomiting or a decreased appetite. Confusion. Irritability or tiredness. A fever or  chills. Diarrhea. The first symptom in older adults may be confusion. In some cases, they may not have any symptoms until the infection has worsened. How is this diagnosed? This condition is diagnosed based on your medical history and a physical exam. You may also have other tests, including: Urine tests. Blood tests. Tests for STIs (sexually transmitted infections). If you have had more than one UTI, a cystoscopy or imaging studies may be done to determine the cause of the infections. How is this treated? Treatment for this condition includes: Antibiotic medicine. Over-the-counter medicines to treat discomfort. Drinking enough water to stay hydrated. If you have frequent infections or have other conditions such as a kidney stone, you may need to see a health care provider who specializes in the urinary tract (urologist). In rare cases, urinary tract infections can cause sepsis. Sepsis is a life-threatening condition that occurs when the body responds to an infection. Sepsis is treated in the hospital with IV antibiotics, fluids, and other medicines. Follow these instructions at home:  Medicines Take over-the-counter and prescription medicines only as told by your health care provider. If you were prescribed an antibiotic medicine, take it as told by your health care provider. Do not stop using the antibiotic even if you start to feel better. General instructions Make sure you: Empty your bladder often and completely. Do not hold urine for long periods of time. Empty your bladder after sex. Wipe from front to back after urinating or having a bowel movement if you are female. Use each tissue only one time when you wipe. Drink enough fluid to keep your urine pale yellow. Keep all follow-up visits. This is important. Contact a health  care provider if: Your symptoms do not get better after 1-2 days. Your symptoms go away and then return. Get help right away if: You have severe pain in  your back or your lower abdomen. You have a fever or chills. You have nausea or vomiting. Summary A urinary tract infection (UTI) is an infection of any part of the urinary tract, which includes the kidneys, ureters, bladder, and urethra. Most urinary tract infections are caused by bacteria in your genital area. Treatment for this condition often includes antibiotic medicines. If you were prescribed an antibiotic medicine, take it as told by your health care provider. Do not stop using the antibiotic even if you start to feel better. Keep all follow-up visits. This is important. This information is not intended to replace advice given to you by your health care provider. Make sure you discuss any questions you have with your health care provider. Document Revised: 10/04/2019 Document Reviewed: 10/09/2019 Elsevier Patient Education  2024 [employer]  Prevention Urinate after sexual activity. Stay well hydrated. Take showers instead of baths. Minimize douching, sprays or powders in the genital area. Wipe front to back.

## 2022-09-18 NOTE — Progress Notes (Signed)
Pt here today requesting a std screening and that she may have a yeast infection.  Pt explained how to obtain self swab and that we will call with abnormal results.  Pt verbalized understanding.   Melanie Cain  09/18/22

## 2022-09-19 LAB — CERVICOVAGINAL ANCILLARY ONLY
Bacterial Vaginitis (gardnerella): POSITIVE — AB
Candida Glabrata: NEGATIVE
Candida Vaginitis: NEGATIVE
Chlamydia: NEGATIVE
Comment: NEGATIVE
Comment: NEGATIVE
Comment: NEGATIVE
Comment: NEGATIVE
Comment: NEGATIVE
Comment: NORMAL
Neisseria Gonorrhea: NEGATIVE
Trichomonas: NEGATIVE

## 2022-09-20 ENCOUNTER — Other Ambulatory Visit: Payer: Self-pay | Admitting: Advanced Practice Midwife

## 2022-09-20 DIAGNOSIS — B9689 Other specified bacterial agents as the cause of diseases classified elsewhere: Secondary | ICD-10-CM

## 2022-09-20 MED ORDER — METRONIDAZOLE 500 MG PO TABS: 500.0000 mg | ORAL_TABLET | Freq: Two times a day (BID) | ORAL | 0 refills | Status: DC

## 2023-02-05 ENCOUNTER — Ambulatory Visit (HOSPITAL_COMMUNITY)
Admission: RE | Admit: 2023-02-05 | Discharge: 2023-02-05 | Disposition: A | Discharge status: Home / Self Care | Source: Ambulatory Visit | Attending: Emergency Medicine | Admitting: Emergency Medicine

## 2023-02-05 ENCOUNTER — Encounter (HOSPITAL_COMMUNITY): Payer: Self-pay

## 2023-02-05 ENCOUNTER — Ambulatory Visit: Payer: Self-pay

## 2023-02-05 VITALS — BP 127/84 | HR 69 | Temp 98.3°F | Resp 18

## 2023-02-05 DIAGNOSIS — Z202 Contact with and (suspected) exposure to infections with a predominantly sexual mode of transmission: Secondary | ICD-10-CM | POA: Diagnosis not present

## 2023-02-05 DIAGNOSIS — Z113 Encounter for screening for infections with a predominantly sexual mode of transmission: Secondary | ICD-10-CM

## 2023-02-05 LAB — POCT URINALYSIS DIP (MANUAL ENTRY)
Bilirubin, UA: NEGATIVE
Blood, UA: NEGATIVE
Glucose, UA: NEGATIVE mg/dL
Ketones, POC UA: NEGATIVE mg/dL
Leukocytes, UA: NEGATIVE
Nitrite, UA: NEGATIVE
Protein Ur, POC: NEGATIVE mg/dL
Spec Grav, UA: 1.02 (ref 1.010–1.025)
Urobilinogen, UA: 1 U/dL
pH, UA: 7 (ref 5.0–8.0)

## 2023-02-05 LAB — HIV ANTIBODY (ROUTINE TESTING W REFLEX): HIV Screen 4th Generation wRfx: NONREACTIVE

## 2023-02-05 MED ORDER — METRONIDAZOLE 500 MG PO TABS: 500.0000 mg | ORAL_TABLET | Freq: Two times a day (BID) | ORAL | 0 refills | Status: DC

## 2023-02-05 NOTE — Discharge Instructions (Signed)
We will call you if anything on your swab or blood work returns positive. You can also see these results on MyChart. Please abstain from sexual intercourse until your results return.  I am treating you for trichomonas. Take the flagyl twice daily for 7 days (with food!) Do not drink alcohol while taking this medication or it will make you very sick

## 2023-02-05 NOTE — ED Triage Notes (Signed)
Pt states she was having urine frequency and she thinks its resolved. She then got a call from her partner that he tested positive for trich so now she needs tested for STI.

## 2023-02-05 NOTE — ED Provider Notes (Signed)
[facility]    CSN: [id number] Arrival date & time: 02/05/23  9528      History   Chief Complaint Chief Complaint  Patient presents with   Urinary Frequency    Entered by patient   SEXUALLY TRANSMITTED DISEASE    HPI Melanie Cain is a 27 y.o. female.  Here for STD testing including blood work Her partner informed her he is positive for trichomonas She is not having any symptoms. 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   Vitamin D deficiency 11/23/2015   Asthma 11/23/2015   Drug allergy--prednisone 11/23/2015    Past Surgical History:  Procedure Laterality Date   LAPAROSCOPY ABDOMEN DIAGNOSTIC     NO PAST SURGERIES     VAGINAL HYSTERECTOMY Bilateral 04/11/2021   Procedure: HYSTERECTOMY VAGINAL WITH BILATERAL SALPINGECTOMY;  Surgeon: [name], MD;  Location: [facility] OR;  Service: Gynecology;  Laterality: Bilateral;   WISDOM TOOTH EXTRACTION Bilateral     OB History     Gravida  2   Para  2   Term  2   Preterm  0   AB  0   Living  2      SAB  0   IAB  0   Ectopic  0   Multiple  0   Live Births  2            Home Medications    Prior to Admission medications   Medication Sig Start Date End Date Taking? Authorizing Provider  metroNIDAZOLE (FLAGYL) 500 MG tablet Take 1 tablet (500 mg total) by mouth 2 (two) times daily. 02/05/23  Yes Beyza Bellino, Lurena Joiner, PA-C  famotidine (PEPCID) 20 MG tablet Take 1 tablet (20 mg total) by mouth 2 (two) times daily.  05/29/19 10/29/19  Belinda Fisher, PA-C    Family History Family History  Problem Relation Age of Onset   Asthma Mother    Hypertension Mother    Asthma Father    Asthma Sister        2 sisters   Breast cancer Maternal Grandmother    Cancer Maternal Grandmother 40       breast cancer   Hearing loss Maternal Grandmother    Asthma Other    Neuropathy Neg Hx    Stroke Neg Hx     Social History Social History   Tobacco Use   Smoking status: Some Days    Types: Cigars   Smokeless tobacco: Never   Tobacco comments:    Smokes Black & Milds infrequently  Vaping Use   Vaping status: Never Used  Substance Use Topics   Alcohol use: Not Currently    Alcohol/week: 7.0 standard drinks of alcohol    Types: 2 Glasses of wine, 5 Shots of liquor per week   Drug use: No     Allergies   Montelukast sodium, Prednisone, and Tessalon [benzonatate]   Review of Systems Review of Systems Per  HPI  Physical Exam Triage Vital Signs ED Triage Vitals  Encounter Vitals Group     BP 02/05/23 0848 127/84     Systolic BP Percentile --      Diastolic BP Percentile --      Pulse Rate 02/05/23 0848 69     Resp 02/05/23 0848 18     Temp 02/05/23 0848 98.3 F (36.8 C)     Temp Source 02/05/23 0848 Oral     SpO2 02/05/23 0848 99 %     Weight --      Height --      Head Circumference --      Peak Flow --      Pain Score 02/05/23 0846 0     Pain Loc --      Pain Education --      Exclude from Growth Chart --    No data found.  Updated Vital Signs BP 127/84 (BP Location: Left Arm)   Pulse 69   Temp 98.3 F (36.8 C) (Oral)   Resp 18   LMP  (LMP Unknown) Comment: Has had continually bleeding  SpO2 99%   Visual Acuity Right Eye Distance:   Left Eye Distance:   Bilateral Distance:    Right Eye Near:   Left Eye Near:    Bilateral Near:     Physical Exam Vitals and nursing note reviewed.  Constitutional:      General: She is not in acute distress.    Appearance: Normal appearance.   Cardiovascular:     Rate and Rhythm: Normal rate and regular rhythm.     Heart sounds: Normal heart sounds.  Pulmonary:     Effort: Pulmonary effort is normal.     Breath sounds: Normal breath sounds.  Neurological:     Mental Status: She is alert and oriented to person, place, and time.      UC Treatments / Results  Labs (all labs ordered are listed, but only abnormal results are displayed) Labs Reviewed  POCT URINALYSIS DIP (MANUAL ENTRY) - Abnormal; Notable for the following components:      Result Value   Clarity, UA cloudy (*)    All other components within normal limits  HIV ANTIBODY (ROUTINE TESTING W REFLEX)  RPR  CERVICOVAGINAL ANCILLARY ONLY    EKG   Radiology No results found.  Procedures Procedures (including critical care time)  Medications Ordered in UC Medications - No data to display  Initial Impression / Assessment and Plan / UC Course  I have reviewed the triage vital signs and the nursing notes.  Pertinent labs & imaging results that were available during my care of the patient were reviewed by me and considered in my medical decision making (see chart for details).  UA unremarkable. S/P hysterectomy.  Cytology swab pending with HIV/RPR With +trich exposure, treat with flagyl BID x 7 Discussed side effects and precautions Will contact if change in therapy is needed  Final Clinical Impressions(s) / UC Diagnoses   Final diagnoses:  Screen for STD (sexually transmitted disease)  Trichomonas exposure     Discharge Instructions      We will call you if anything on your swab or blood work returns positive. You can also see these results on MyChart. Please abstain from sexual intercourse until your results return.  I am treating you for trichomonas. Take the flagyl twice daily for 7 days (with food!) Do not drink alcohol while taking this medication or it will make you very  sick    ED Prescriptions     Medication Sig Dispense Auth.  Provider   metroNIDAZOLE (FLAGYL) 500 MG tablet Take 1 tablet (500 mg total) by mouth 2 (two) times daily. 14 tablet Odessie Polzin, Lurena Joiner, PA-C      PDMP not reviewed this encounter.   Kathrine Haddock 02/05/23 [id number]

## 2023-02-06 LAB — RPR: RPR Ser Ql: NONREACTIVE

## 2023-02-06 LAB — CERVICOVAGINAL ANCILLARY ONLY
Chlamydia: NEGATIVE
Comment: NEGATIVE
Comment: NEGATIVE
Comment: NORMAL
Neisseria Gonorrhea: NEGATIVE
Trichomonas: NEGATIVE

## 2023-04-04 ENCOUNTER — Ambulatory Visit
Admission: RE | Admit: 2023-04-04 | Discharge: 2023-04-04 | Disposition: A | Discharge status: Home / Self Care | Source: Ambulatory Visit

## 2023-04-04 VITALS — BP 122/79 | HR 61 | Temp 98.2°F | Resp 18 | Ht 66.0 in | Wt 162.7 lb

## 2023-04-04 DIAGNOSIS — J45901 Unspecified asthma with (acute) exacerbation: Secondary | ICD-10-CM | POA: Diagnosis not present

## 2023-04-04 MED ORDER — ALBUTEROL SULFATE HFA 108 (90 BASE) MCG/ACT IN AERS: 1.0000 | INHALATION_SPRAY | Freq: Four times a day (QID) | RESPIRATORY_TRACT | 0 refills | Status: AC | PRN

## 2023-04-04 NOTE — ED Provider Notes (Signed)
[name]    CSN: [id number] Arrival date & time: 04/04/23  1654      History   Chief Complaint Chief Complaint  Patient presents with   Cough    Entered by patient    HPI Melanie Cain is a 28 y.o. female.   Here today for evaluation of cough that has been ongoing for over a month.  She reports that she feels as if she has had more shortness of breath than usual and has been using her inhaler more.  She reports she needs a refill of same.  She does have known asthma.  She is unable to take prednisone.  She states she does not currently have a primary care provider.  She has not had any fever.  She denies any nausea and has only had vomiting with mucus.  The history is provided by the patient.  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   Vitamin D deficiency 11/23/2015   Asthma 11/23/2015   Drug allergy--prednisone 11/23/2015    Past Surgical History:  Procedure Laterality Date   LAPAROSCOPY ABDOMEN DIAGNOSTIC     NO PAST SURGERIES     VAGINAL HYSTERECTOMY Bilateral 04/11/2021   Procedure: HYSTERECTOMY VAGINAL WITH BILATERAL SALPINGECTOMY;  Surgeon: [name], MD;  Location: [facility] OR;  Service: Gynecology;  Laterality: Bilateral;   WISDOM TOOTH EXTRACTION Bilateral     OB History     Gravida  2   Para  2   Term  2   Preterm  0   AB  0   Living  2      SAB  0   IAB  0   Ectopic  0   Multiple  0   Live Births   2            Home Medications    Prior to Admission medications   Medication Sig Start Date End Date Taking? Authorizing Provider  albuterol (VENTOLIN HFA) 108 (90 Base) MCG/ACT inhaler Inhale 1-2 puffs into the lungs every 6 (six) hours as needed for wheezing or shortness of breath. 04/04/23  Yes Tomi Bamberger, PA-C  cetirizine (ZYRTEC) 10 MG tablet Take 10 mg by mouth daily.   Yes [provider]  fluticasone-salmeterol (ADVAIR DISKUS) 500-50 MCG/ACT AEPB Inhale 1 puff into the lungs in the morning and at bedtime.   Yes [provider]  Azelastine HCl 137 MCG/SPRAY SOLN     [provider]  cyclobenzaprine (FLEXERIL) 10 MG tablet Take 10 mg by mouth 2 (two) times daily.    [provider]  fluticasone-salmeterol (ADVAIR DISKUS) 100-50 MCG/ACT AEPB Inhale 1 puff into the lungs 2 (two) times daily.    [provider]  fluticasone-salmeterol (ADVAIR DISKUS) 250-50 MCG/ACT AEPB Inhale 1 puff into the lungs in the morning and at bedtime.    [provider]  HYDROcodone-acetaminophen (  NORCO/VICODIN) 5-325 MG tablet Take 1-2 tablets by mouth every 4 (four) hours as needed for moderate pain (pain score 4-6) or severe pain (pain score 7-10).    [provider]  hydrOXYzine (ATARAX) 25 MG tablet Take 25 mg by mouth every 8 (eight) hours as needed for anxiety, itching, nausea or vomiting.    [provider]  hydrOXYzine (ATARAX) 50 MG tablet Take 50 mg by mouth every 6 (six) hours as needed for anxiety, itching, nausea or vomiting.    [provider]  ibuprofen (ADVIL) 600 MG tablet Take 600 mg by mouth every 6 (six) hours as needed.    [provider]  metroNIDAZOLE (FLAGYL) 500 MG tablet Take 1 tablet (500 mg total) by mouth 2 (two) times daily. 02/05/23   Rising, Lurena Joiner, PA-C  nitrofurantoin, macrocrystal-monohydrate, (MACROBID) 100 MG capsule Take 100 mg by mouth 2 (two) times daily.    [provider]  Norethindrone Acetate-Ethinyl Estrad-FE (LARIN 24 FE) 1-20 MG-MCG(24) tablet Take 1 tablet by mouth daily.    [provider]  sertraline (ZOLOFT) 100 MG tablet Take 100 mg by mouth daily.    [provider]  sertraline (ZOLOFT) 25 MG tablet Take 25 mg by mouth daily.    [provider]  sertraline (ZOLOFT) 50 MG tablet Take 50 mg by mouth daily.    [provider]  SUMAtriptan (IMITREX) 100 MG tablet Take 100 mg by mouth every 2 (two) hours as needed for migraine or headache.    [provider]  famotidine (PEPCID) 20 MG tablet Take 1 tablet (20 mg total) by mouth 2 (two) times daily. 05/29/19 10/29/19  Belinda Fisher, PA-C    Family History Family History  Problem Relation Age of Onset   Asthma Mother    Hypertension Mother    Asthma Father    Asthma Sister        2 sisters   Breast cancer Maternal Grandmother    Cancer Maternal Grandmother 40       breast cancer   Hearing loss Maternal Grandmother    Asthma Other    Neuropathy Neg Hx    Stroke Neg Hx     Social History Social History   Tobacco Use   Smoking status: Former    Types: Cigarettes   Smokeless tobacco: Never   Tobacco comments:    Smokes Black & Milds infrequently  Vaping Use   Vaping status: Never Used  Substance Use Topics   Alcohol use: Not Currently   Drug use: No     Allergies   Montelukast sodium, Prednisone, and Tessalon [benzonatate]   Review of Systems Review of Systems  Constitutional:  Negative for chills and fever.  HENT:  Negative for congestion, ear pain and sore throat.   Eyes:  Negative for discharge and redness.  Respiratory:  Positive for cough, shortness of breath and wheezing.   Gastrointestinal:  Negative for abdominal pain, diarrhea, nausea and vomiting.     Physical Exam Triage Vital Signs ED Triage Vitals  Encounter Vitals Group     BP 04/04/23 1743 122/79     Systolic BP Percentile --      Diastolic BP Percentile  --      Pulse Rate 04/04/23 1743 61     Resp 04/04/23 1743 18     Temp 04/04/23 1743 98.2 F (36.8 C)     Temp Source 04/04/23 1743 Oral     SpO2 04/04/23 1743 99 %  Weight 04/04/23 1737 162 lb 11.2 oz (73.8 kg)     Height 04/04/23 1737 5\' 6"  (1.676 m)     Head Circumference --      Peak Flow --      Pain Score 04/04/23 1736 0     Pain Loc --      Pain Education --      Exclude from Growth Chart --    No data found.  Updated Vital Signs BP 122/79 (BP Location: Left Arm)   Pulse 61   Temp 98.2 F (36.8 C) (Oral)   Resp 18   Ht 5\' 6"  (1.676 m)   Wt 162 lb 11.2 oz (73.8 kg)   LMP  (LMP Unknown) Comment: Has had continually bleeding  SpO2 99%   BMI 26.26 kg/m   Visual Acuity Right Eye Distance:   Left Eye Distance:   Bilateral Distance:    Right Eye Near:   Left Eye Near:    Bilateral Near:     Physical Exam Vitals and nursing note reviewed.  Constitutional:      General: She is not in acute distress.    Appearance: Normal appearance. She is not ill-appearing.  HENT:     Head: Normocephalic and atraumatic.     Nose: Congestion present.     Mouth/Throat:     Mouth: Mucous membranes are moist.     Pharynx: No oropharyngeal exudate or posterior oropharyngeal erythema.  Eyes:     Conjunctiva/sclera: Conjunctivae normal.  Cardiovascular:     Rate and Rhythm: Normal rate and regular rhythm.     Heart sounds: Normal heart sounds. No murmur heard. Pulmonary:     Effort: Pulmonary effort is normal. No respiratory distress.     Breath sounds: Normal breath sounds. No wheezing, rhonchi or rales.  Skin:    General: Skin is warm and dry.  Neurological:     Mental Status: She is alert.  Psychiatric:        Mood and Affect: Mood normal.        Thought Content: Thought content normal.      UC Treatments / Results  Labs (all labs ordered are listed, but only abnormal results are displayed) Labs Reviewed - No data to display  EKG   Radiology No results  found.  Procedures Procedures (including critical care time)  Medications Ordered in UC Medications - No data to display  Initial Impression / Assessment and Plan / UC Course  I have reviewed the triage vital signs and the nursing notes.  Pertinent labs & imaging results that were available during my care of the patient were reviewed by me and considered in my medical decision making (see chart for details).    Suspect asthma exacerbation and would typically treat with steroid burst however patient does not tolerate same.  Symptoms seemingly mild and no wheezing appreciated in office.  Will refill albuterol inhaler but strongly recommended further evaluation by primary care and appointment made in office to establish care with same.  Encouraged sooner follow-up with any worsening or persistent symptoms.  Final Clinical Impressions(s) / UC Diagnoses   Final diagnoses:  Asthma with acute exacerbation, unspecified asthma severity, unspecified whether persistent   Discharge Instructions   None    ED Prescriptions     Medication Sig Dispense Auth. Provider   albuterol (VENTOLIN HFA) 108 (90 Base) MCG/ACT inhaler Inhale 1-2 puffs into the lungs every 6 (six) hours as needed for wheezing or shortness of breath. 8 g  Tomi Bamberger, PA-C      PDMP not reviewed this encounter.   Tomi Bamberger, PA-C 04/04/23 Windell Moment

## 2023-04-04 NOTE — ED Triage Notes (Signed)
Pt presents with cough that started a little over a month ago. Pt thought it was allergies so treated it as such. Symptoms worsened over time and pt says she has mucus build up on her chest. Now she is having a hard time breathing and has used her inhaler more often in this last week because of the mucus build up.

## 2023-06-07 ENCOUNTER — Other Ambulatory Visit: Payer: Self-pay

## 2023-06-07 ENCOUNTER — Encounter (HOSPITAL_COMMUNITY): Payer: Self-pay

## 2023-06-07 ENCOUNTER — Ambulatory Visit (HOSPITAL_COMMUNITY)
Admission: RE | Admit: 2023-06-07 | Discharge: 2023-06-07 | Disposition: A | Discharge status: Home / Self Care | Source: Ambulatory Visit | Attending: Family Medicine | Admitting: Family Medicine

## 2023-06-07 VITALS — BP 121/76 | HR 76 | Temp 98.3°F | Resp 18

## 2023-06-07 DIAGNOSIS — R350 Frequency of micturition: Secondary | ICD-10-CM | POA: Diagnosis present

## 2023-06-07 DIAGNOSIS — N3001 Acute cystitis with hematuria: Secondary | ICD-10-CM | POA: Diagnosis present

## 2023-06-07 LAB — POCT URINALYSIS DIP (MANUAL ENTRY)
Bilirubin, UA: NEGATIVE
Glucose, UA: NEGATIVE mg/dL
Ketones, POC UA: NEGATIVE mg/dL
Nitrite, UA: POSITIVE — AB
Protein Ur, POC: 30 mg/dL — AB
Spec Grav, UA: 1.02
Urobilinogen, UA: 1 U/dL
pH, UA: 7

## 2023-06-07 MED ORDER — SULFAMETHOXAZOLE-TRIMETHOPRIM 800-160 MG PO TABS: 1.0000 | ORAL_TABLET | Freq: Two times a day (BID) | ORAL | 0 refills | Status: AC

## 2023-06-07 NOTE — ED Triage Notes (Signed)
 Pt reports frequency since Monday. Pt reports she tried out new soap that may be the cause.

## 2023-06-07 NOTE — Discharge Instructions (Signed)
 Starting Bactrim twice daily for 5 days for urinary tract infection.  We have sent a urine culture and the results will return over the next few days and someone will call if we need to adjust the current treatment plan.  Return here if symptoms persist or worsen.

## 2023-06-07 NOTE — ED Provider Notes (Signed)
 [facility]    CSN: [id number] Arrival date & time: 06/07/23  1051      History   Chief Complaint Chief Complaint  Patient presents with   Urinary Frequency    Entered by patient    HPI Melanie Cain is a 28 y.o. female.   Patient presents with urinary frequency and bladder pressure since 3/24.  Patient states that she did notice some blood in her urine on 3/26.  Patient reports history of UTIs and states the symptoms feel the same.  Denies vaginal discharge, vaginal bleeding, flank pain, fever, nausea, and vomiting.   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   Vitamin D deficiency 11/23/2015   Asthma 11/23/2015   Drug allergy--prednisone 11/23/2015    Past Surgical History:  Procedure Laterality Date   LAPAROSCOPY ABDOMEN DIAGNOSTIC     NO PAST SURGERIES     VAGINAL HYSTERECTOMY Bilateral 04/11/2021   Procedure: HYSTERECTOMY VAGINAL WITH BILATERAL SALPINGECTOMY;  Surgeon: [name], MD;  Location: [facility] OR;  Service: Gynecology;  Laterality: Bilateral;   WISDOM TOOTH EXTRACTION Bilateral     OB History     Gravida  2   Para  2   Term  2   Preterm  0   AB  0   Living  2      SAB  0   IAB  0   Ectopic  0   Multiple  0   Live Births  2            Home Medications    Prior to Admission medications   Medication Sig Start Date End Date Taking? Authorizing Provider  albuterol (VENTOLIN HFA) 108 (90 Base) MCG/ACT inhaler Inhale 1-2 puffs into the lungs every 6 (six) hours as needed for wheezing or shortness of breath. 04/04/23  Yes Tomi Bamberger, PA-C  fluticasone-salmeterol (ADVAIR DISKUS) 250-50 MCG/ACT AEPB Inhale 1 puff into the lungs in the morning and at bedtime.   Yes [provider]  sulfamethoxazole-trimethoprim (BACTRIM DS) 800-160 MG tablet Take 1 tablet by mouth 2 (two) times daily for 5 days. 06/07/23 06/12/23 Yes Wynonia Lawman A, NP  Azelastine HCl 137 MCG/SPRAY SOLN     [provider]  cetirizine (ZYRTEC) 10 MG tablet Take 10 mg by mouth daily.    [provider]  cyclobenzaprine (FLEXERIL) 10 MG tablet Take 10 mg by mouth 2 (two) times daily.    [provider]  fluticasone-salmeterol (ADVAIR DISKUS) 100-50 MCG/ACT AEPB Inhale 1 puff into the lungs 2 (two) times daily.    [provider]  fluticasone-salmeterol (ADVAIR DISKUS) 500-50 MCG/ACT AEPB Inhale 1 puff into the lungs in the morning and at bedtime.    [provider]  HYDROcodone-acetaminophen (NORCO/VICODIN) 5-325 MG tablet Take 1-2 tablets by mouth every 4 (four) hours as needed for moderate pain (pain score 4-6) or severe pain (pain score 7-10).    [provider]  hydrOXYzine (ATARAX) 25 MG tablet Take 25 mg by mouth every 8 (eight)  hours as needed for anxiety, itching, nausea or vomiting.    [provider]  hydrOXYzine (ATARAX) 50 MG tablet Take 50 mg by mouth every 6 (six) hours as needed for anxiety, itching, nausea or vomiting.    [provider]  ibuprofen (ADVIL) 600 MG tablet Take 600 mg by mouth every 6 (six) hours as needed.    [provider]  nitrofurantoin, macrocrystal-monohydrate, (MACROBID) 100 MG capsule Take 100 mg by mouth 2 (two) times daily.    [provider]  Norethindrone Acetate-Ethinyl Estrad-FE (LARIN 24 FE) 1-20 MG-MCG(24) tablet Take 1 tablet by mouth daily.    [provider]  sertraline (ZOLOFT) 100 MG tablet Take 100 mg by mouth daily.    [provider]  sertraline (ZOLOFT) 25 MG tablet Take 25 mg by mouth  daily.    [provider]  sertraline (ZOLOFT) 50 MG tablet Take 50 mg by mouth daily.    [provider]  SUMAtriptan (IMITREX) 100 MG tablet Take 100 mg by mouth every 2 (two) hours as needed for migraine or headache.    [provider]  famotidine (PEPCID) 20 MG tablet Take 1 tablet (20 mg total) by mouth 2 (two) times daily. 05/29/19 10/29/19  Belinda Fisher, PA-C    Family History Family History  Problem Relation Age of Onset   Asthma Mother    Hypertension Mother    Asthma Father    Asthma Sister        2 sisters   Breast cancer Maternal Grandmother    Cancer Maternal Grandmother 40       breast cancer   Hearing loss Maternal Grandmother    Asthma Other    Neuropathy Neg Hx    Stroke Neg Hx     Social History Social History   Tobacco Use   Smoking status: Former    Types: Cigarettes   Smokeless tobacco: Never   Tobacco comments:    Smokes Black & Milds infrequently  Vaping Use   Vaping status: Never Used  Substance Use Topics   Alcohol use: Not Currently   Drug use: No     Allergies   Montelukast sodium, Prednisone, and Tessalon [benzonatate]   Review of Systems Review of Systems  Genitourinary:  Positive for frequency.   Per HPI  Physical Exam Triage Vital Signs ED Triage Vitals  Encounter Vitals Group     BP 06/07/23 1101 121/76     Systolic BP Percentile --      Diastolic BP Percentile --      Pulse Rate 06/07/23 1101 76     Resp 06/07/23 1101 18     Temp 06/07/23 1101 98.3 F (36.8 C)     Temp src --      SpO2 06/07/23 1101 97 %     Weight --      Height --      Head Circumference --      Peak Flow --      Pain Score 06/07/23 1059 4     Pain Loc --      Pain Education --      Exclude from Growth Chart --    No data found.  Updated Vital Signs BP 121/76   Pulse 76   Temp 98.3 F (36.8 C)   Resp 18   LMP  (LMP Unknown) Comment: Has had continually bleeding  SpO2 97%   Visual Acuity Right Eye Distance:    Left Eye Distance:  Bilateral Distance:    Right Eye Near:   Left Eye Near:    Bilateral Near:     Physical Exam Vitals and nursing note reviewed.  Constitutional:      General: She is awake. She is not in acute distress.    Appearance: Normal appearance. She is well-developed and well-groomed. She is not ill-appearing.  Abdominal:     Tenderness: There is abdominal tenderness in the suprapubic area. There is no right CVA tenderness or left CVA tenderness.  Skin:    General: Skin is warm and dry.  Neurological:     Mental Status: She is alert.  Psychiatric:        Behavior: Behavior is cooperative.      UC Treatments / Results  Labs (all labs ordered are listed, but only abnormal results are displayed) Labs Reviewed  POCT URINALYSIS DIP (MANUAL ENTRY) - Abnormal; Notable for the following components:      Result Value   Clarity, UA cloudy (*)    Blood, UA small (*)    Protein Ur, POC =30 (*)    Nitrite, UA Positive (*)    Leukocytes, UA Moderate (2+) (*)    All other components within normal limits  URINE CULTURE    EKG   Radiology No results found.  Procedures Procedures (including critical care time)  Medications Ordered in UC Medications - No data to display  Initial Impression / Assessment and Plan / UC Course  I have reviewed the triage vital signs and the nursing notes.  Pertinent labs & imaging results that were available during my care of the patient were reviewed by me and considered in my medical decision making (see chart for details).     Upon assessment suprapubic tenderness noted.  Without CVA tenderness.  Urinalysis revealed small blood, positive nitrates, moderate leukocytes, will send culture.  Prescribed Bactrim for cystitis coverage.  Discussed follow-up and return precautions Final Clinical Impressions(s) / UC Diagnoses   Final diagnoses:  Acute cystitis with hematuria  Urinary frequency     Discharge Instructions       Starting Bactrim twice daily for 5 days for urinary tract infection.  We have sent a urine culture and the results will return over the next few days and someone will call if we need to adjust the current treatment plan.  Return here if symptoms persist or worsen.    ED Prescriptions     Medication Sig Dispense Auth. Provider   sulfamethoxazole-trimethoprim (BACTRIM DS) 800-160 MG tablet Take 1 tablet by mouth 2 (two) times daily for 5 days. 10 tablet Wynonia Lawman A, NP      PDMP not reviewed this encounter.   Wynonia Lawman A, NP 06/07/23 [phone]

## 2023-06-09 LAB — URINE CULTURE: Culture: >=100000 — AB

## 2023-07-17 ENCOUNTER — Encounter (HOSPITAL_BASED_OUTPATIENT_CLINIC_OR_DEPARTMENT_OTHER): Payer: Self-pay

## 2023-07-17 ENCOUNTER — Ambulatory Visit (HOSPITAL_BASED_OUTPATIENT_CLINIC_OR_DEPARTMENT_OTHER)
Admission: RE | Admit: 2023-07-17 | Discharge: 2023-07-17 | Disposition: A | Discharge status: Home / Self Care | Source: Ambulatory Visit | Attending: Family Medicine | Admitting: Family Medicine

## 2023-07-17 VITALS — BP 129/84 | HR 62 | Temp 98.3°F | Resp 18

## 2023-07-17 DIAGNOSIS — B9689 Other specified bacterial agents as the cause of diseases classified elsewhere: Secondary | ICD-10-CM | POA: Diagnosis present

## 2023-07-17 DIAGNOSIS — N760 Acute vaginitis: Secondary | ICD-10-CM | POA: Diagnosis not present

## 2023-07-17 DIAGNOSIS — B3731 Acute candidiasis of vulva and vagina: Secondary | ICD-10-CM | POA: Diagnosis present

## 2023-07-17 LAB — POCT URINALYSIS DIP (MANUAL ENTRY)
Bilirubin, UA: NEGATIVE
Blood, UA: NEGATIVE
Glucose, UA: NEGATIVE mg/dL
Ketones, POC UA: NEGATIVE mg/dL
Leukocytes, UA: NEGATIVE
Nitrite, UA: NEGATIVE
Protein Ur, POC: NEGATIVE mg/dL
Spec Grav, UA: 1.015
Urobilinogen, UA: 0.2 U/dL
pH, UA: 6.5

## 2023-07-17 MED ORDER — METRONIDAZOLE 500 MG PO TABS: 500.0000 mg | ORAL_TABLET | Freq: Two times a day (BID) | ORAL | 0 refills | Status: DC

## 2023-07-17 MED ORDER — FLUCONAZOLE 150 MG PO TABS: ORAL_TABLET | ORAL | 2 refills | Status: DC

## 2023-07-17 NOTE — ED Triage Notes (Signed)
 Patient c/o urinary frequency x 3 days

## 2023-07-17 NOTE — Discharge Instructions (Signed)
 Exam is most consistent with bacterial vaginosis and vaginal candidiasis.    Bacterial vaginosis is a bacterial imbalance.  Metronidazole  500 mg twice daily for 7 days.  No alcohol while you are on this medication.  Use boric acid suppositories once or twice a month to increase the pH of the vaginal vault and reduce these infections.  Vaginal candidiasis is an opportunistic yeast infection of the vagina.  Will treat with fluconazole , 150 mg, 1 now and repeat every 5 days for total of 4 doses, if needed.  Refills provided but wait 1 to 2 weeks before trying any refills.  STI testing is pending and we will update the patient once the results are completed.  Follow-up if symptoms do not improve, worsen or new symptoms occur.

## 2023-07-17 NOTE — ED Provider Notes (Addendum)
 [facility]    CSN: [id number] Arrival date & time: 07/17/23  1719      History   Chief Complaint Chief Complaint  Patient presents with   Urinary Frequency    Entered by patient    HPI Madylynn Graetz Pangilinan is a 28 y.o. female.   Patient has some burning with urination and urinary frequency.  Symptoms have been present since 07/14/2023.  She had a hysterectomy several years ago and still has her ovaries to she often can tell when she is ovulating.  She does feel like it is kind of time that she would normally have her menstrual cycle.  She has noticed a little bit of discharge in her underwear and definitely some odor vaginally.  She is sexually active but with a stable partner but would like vaginal STI testing if possible.   Urinary Frequency Pertinent negatives include no chest pain, no abdominal pain and no shortness of breath.    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   Vitamin D deficiency 11/23/2015   Asthma 11/23/2015   Drug allergy--prednisone 11/23/2015    Past Surgical History:  Procedure Laterality Date   LAPAROSCOPY ABDOMEN DIAGNOSTIC     NO PAST SURGERIES     VAGINAL HYSTERECTOMY Bilateral 04/11/2021   Procedure: HYSTERECTOMY VAGINAL WITH BILATERAL SALPINGECTOMY;  Surgeon: Granville Layer, MD;  Location: [facility] OR;  Service: Gynecology;  Laterality: Bilateral;   WISDOM TOOTH EXTRACTION Bilateral     OB History     Gravida  2   Para  2   Term  2   Preterm  0   AB  0   Living  2      SAB  0   IAB  0   Ectopic  0   Multiple  0   Live Births  2            Home  Medications    Prior to Admission medications   Medication Sig Start Date End Date Taking? Authorizing Provider  fluconazole  (DIFLUCAN ) 150 MG tablet By mouth today and repeat every 5 days for vaginal yeast infection.  May use up to four pills, if needed 07/17/23  Yes Guss Legacy, FNP  metroNIDAZOLE  (FLAGYL ) 500 MG tablet Take 1 tablet (500 mg total) by mouth 2 (two) times daily. 07/17/23  Yes Guss Legacy, FNP  albuterol  (VENTOLIN  HFA) 108 (90 Base) MCG/ACT inhaler Inhale 1-2 puffs into the lungs every 6 (six) hours as needed for wheezing or shortness of breath. 04/04/23   Vernestine Gondola, PA-C  Azelastine  HCl 137 MCG/SPRAY SOLN     [provider]  cetirizine  (ZYRTEC ) 10 MG tablet Take 10 mg by mouth daily.    [provider]  fluticasone -salmeterol (ADVAIR DISKUS) 100-50 MCG/ACT AEPB Inhale 1 puff into the lungs 2 (two) times daily.    [provider]  fluticasone -salmeterol (ADVAIR DISKUS) 500-50 MCG/ACT AEPB Inhale 1 puff into the lungs in the morning and at bedtime.    [provider]  ibuprofen  (ADVIL ) 600 MG tablet Take 600  mg by mouth every 6 (six) hours as needed.    [provider]  famotidine  (PEPCID ) 20 MG tablet Take 1 tablet (20 mg total) by mouth 2 (two) times daily. 05/29/19 10/29/19  Alfrieda Antes, PA-C    Family History Family History  Problem Relation Age of Onset   Asthma Mother    Hypertension Mother    Asthma Father    Asthma Sister        2 sisters   Breast cancer Maternal Grandmother    Cancer Maternal Grandmother 40       breast cancer   Hearing loss Maternal Grandmother    Asthma Other    Neuropathy Neg Hx    Stroke Neg Hx     Social History Social History   Tobacco Use   Smoking status: Former    Types: Cigarettes   Smokeless tobacco: Never   Tobacco comments:    Smokes Black & Milds infrequently  Vaping Use   Vaping status: Never Used  Substance Use Topics   Alcohol use: Yes    Comment: social   Drug use:  No     Allergies   Montelukast sodium, Prednisone, and Tessalon  [benzonatate ]   Review of Systems Review of Systems  Constitutional:  Negative for chills and fever.  HENT:  Negative for ear pain and sore throat.   Eyes:  Negative for pain and visual disturbance.  Respiratory:  Negative for cough and shortness of breath.   Cardiovascular:  Negative for chest pain and palpitations.  Gastrointestinal:  Negative for abdominal pain, constipation, diarrhea, nausea and vomiting.  Genitourinary:  Positive for dysuria, frequency, vaginal discharge and vaginal pain. Negative for hematuria.  Musculoskeletal:  Negative for arthralgias and back pain.  Skin:  Negative for color change and rash.  Neurological:  Negative for seizures and syncope.  All other systems reviewed and are negative.    Physical Exam Triage Vital Signs ED Triage Vitals  Encounter Vitals Group     BP 07/17/23 1734 129/84     Systolic BP Percentile --      Diastolic BP Percentile --      Pulse Rate 07/17/23 1734 62     Resp 07/17/23 1734 18     Temp 07/17/23 1734 98.3 F (36.8 C)     Temp Source 07/17/23 1734 Oral     SpO2 07/17/23 1734 99 %     Weight --      Height --      Head Circumference --      Peak Flow --      Pain Score 07/17/23 1731 0     Pain Loc --      Pain Education --      Exclude from Growth Chart --    No data found.  Updated Vital Signs BP 129/84 (BP Location: Right Arm)   Pulse 62   Temp 98.3 F (36.8 C) (Oral)   Resp 18   LMP  (LMP Unknown) Comment: Has had continually bleeding  SpO2 99%   Visual Acuity Right Eye Distance:   Left Eye Distance:   Bilateral Distance:    Right Eye Near:   Left Eye Near:    Bilateral Near:     Physical Exam Vitals and nursing note reviewed.  Constitutional:      General: She is not in acute distress.    Appearance: She is well-developed. She is not ill-appearing or toxic-appearing.  HENT:     Head: Normocephalic and atraumatic.  Right  Ear: Hearing, tympanic membrane, ear canal and external ear normal.     Left Ear: Hearing, tympanic membrane, ear canal and external ear normal.     Nose: No congestion or rhinorrhea.     Right Sinus: No maxillary sinus tenderness or frontal sinus tenderness.     Left Sinus: No maxillary sinus tenderness or frontal sinus tenderness.     Mouth/Throat:     Lips: Pink.     Mouth: Mucous membranes are moist.     Pharynx: Uvula midline. No oropharyngeal exudate or posterior oropharyngeal erythema.     Tonsils: No tonsillar exudate.  Eyes:     Conjunctiva/sclera: Conjunctivae normal.     Pupils: Pupils are equal, round, and reactive to light.  Cardiovascular:     Rate and Rhythm: Normal rate and regular rhythm.     Heart sounds: S1 normal and S2 normal. No murmur heard. Pulmonary:     Effort: Pulmonary effort is normal. No respiratory distress.     Breath sounds: Normal breath sounds. No decreased breath sounds, wheezing, rhonchi or rales.  Abdominal:     General: Bowel sounds are normal.     Palpations: Abdomen is soft.     Tenderness: There is no abdominal tenderness.     Hernia: There is no hernia in the left inguinal area or right inguinal area.  Genitourinary:    Exam position: Lithotomy position.     Pubic Area: No rash or pubic lice.      Labia:        Right: No rash, tenderness, lesion or injury.        Left: No rash, tenderness, lesion or injury.      Urethra: No prolapse, urethral pain, urethral swelling or urethral lesion.     Vagina: Vaginal discharge (Comments cheeselike white discharge with some gray tent and a slight fishy odor.) and tenderness present.     Comments: Cervix and uterus are surgically absent.  Cervical scar is without lesions. Musculoskeletal:        General: No swelling.     Cervical back: Neck supple.  Lymphadenopathy:     Head:     Right side of head: No submental, submandibular, tonsillar, preauricular or posterior auricular adenopathy.     Left side  of head: No submental, submandibular, tonsillar, preauricular or posterior auricular adenopathy.     Cervical: No cervical adenopathy.     Right cervical: No superficial cervical adenopathy.    Left cervical: No superficial cervical adenopathy.     Lower Body: No right inguinal adenopathy. No left inguinal adenopathy.  Skin:    General: Skin is warm and dry.     Capillary Refill: Capillary refill takes less than 2 seconds.     Findings: No rash.  Neurological:     Mental Status: She is alert and oriented to person, place, and time.  Psychiatric:        Mood and Affect: Mood normal.      UC Treatments / Results  Labs (all labs ordered are listed, but only abnormal results are displayed) Labs Reviewed  POCT URINALYSIS DIP (MANUAL ENTRY) - Normal  CERVICOVAGINAL ANCILLARY ONLY    EKG   Radiology No results found.  Procedures Procedures (including critical care time)  Medications Ordered in UC Medications - No data to display  Initial Impression / Assessment and Plan / UC Course  I have reviewed the triage vital signs and the nursing notes.  Pertinent labs & imaging results that  were available during my care of the patient were reviewed by me and considered in my medical decision making (see chart for details).     Plan of Care: Bacterial vaginosis: Metronidazole  500 mg twice daily for 7 days.  No alcohol while on this medication.  Use boric acid vaginal suppositories once or twice a month to increase the pH of the vaginal vault and reduce bacterial infections. Vaginal candidiasis: She is being put on antibiotics for bacterial vaginosis.  She may need to take the fluconazole  for a longer timeframe due to additional antibiotic use.  Fluconazole  150 mg 1 now and repeat every 5 days for a total of 4 doses.  Wait 1 to 2 weeks and if any symptoms persist repeat the fluconazole . STI testing: Patient is not overly concerned but decided to get STI testing while she was being  examined.  Will update her on the results and adjust the plan of care, if needed once the test results. Possible UTI: Due to her vaginal symptoms she felt like she had a UTI.  Urine was essentially normal.  No urine culture sent.  Will adjust the plan of care, if needed based on symptoms and after her other test result.  Follow-up if symptoms do not improve, worsen or new symptoms occur. Final Clinical Impressions(s) / UC Diagnoses   Final diagnoses:  Vaginal candidiasis  Bacterial vaginosis  Vaginitis and vulvovaginitis     Discharge Instructions      Exam is most consistent with bacterial vaginosis and vaginal candidiasis.    Bacterial vaginosis is a bacterial imbalance.  Metronidazole  500 mg twice daily for 7 days.  No alcohol while you are on this medication.  Use boric acid suppositories once or twice a month to increase the pH of the vaginal vault and reduce these infections.  Vaginal candidiasis is an opportunistic yeast infection of the vagina.  Will treat with fluconazole , 150 mg, 1 now and repeat every 5 days for total of 4 doses, if needed.  Refills provided but wait 1 to 2 weeks before trying any refills.  STI testing is pending and we will update the patient once the results are completed.  Follow-up if symptoms do not improve, worsen or new symptoms occur.     ED Prescriptions     Medication Sig Dispense Auth. Provider   fluconazole  (DIFLUCAN ) 150 MG tablet By mouth today and repeat every 5 days for vaginal yeast infection.  May use up to four pills, if needed 4 tablet Guss Legacy, FNP   metroNIDAZOLE  (FLAGYL ) 500 MG tablet Take 1 tablet (500 mg total) by mouth 2 (two) times daily. 14 tablet Misa Fedorko, FNP      PDMP not reviewed this encounter.   Guss Legacy, FNP 07/17/23 1843    Guss Legacy, FNP 07/17/23 [phone]

## 2023-07-18 LAB — CERVICOVAGINAL ANCILLARY ONLY
Bacterial Vaginitis (gardnerella): POSITIVE — AB
Candida Glabrata: NEGATIVE
Candida Vaginitis: NEGATIVE
Chlamydia: NEGATIVE
Comment: NEGATIVE
Comment: NEGATIVE
Comment: NEGATIVE
Comment: NEGATIVE
Comment: NEGATIVE
Comment: NORMAL
Neisseria Gonorrhea: NEGATIVE
Trichomonas: NEGATIVE

## 2023-09-05 ENCOUNTER — Other Ambulatory Visit: Payer: Self-pay

## 2023-09-05 ENCOUNTER — Encounter: Payer: Self-pay | Admitting: Obstetrics and Gynecology

## 2023-09-05 ENCOUNTER — Other Ambulatory Visit (HOSPITAL_COMMUNITY)
Admission: RE | Admit: 2023-09-05 | Discharge: 2023-09-05 | Disposition: A | Discharge status: Home / Self Care | Source: Ambulatory Visit | Attending: Obstetrics and Gynecology | Admitting: Obstetrics and Gynecology

## 2023-09-05 ENCOUNTER — Ambulatory Visit: Admitting: Obstetrics and Gynecology

## 2023-09-05 VITALS — BP 111/70 | HR 59 | Ht 66.0 in | Wt 165.6 lb

## 2023-09-05 DIAGNOSIS — Z113 Encounter for screening for infections with a predominantly sexual mode of transmission: Secondary | ICD-10-CM

## 2023-09-05 DIAGNOSIS — Z01419 Encounter for gynecological examination (general) (routine) without abnormal findings: Secondary | ICD-10-CM

## 2023-09-05 DIAGNOSIS — Z90710 Acquired absence of both cervix and uterus: Secondary | ICD-10-CM

## 2023-09-05 NOTE — Progress Notes (Signed)
 ANNUAL EXAM Patient name: Melanie Cain MRN [id number]  Date of birth: 19-Nov-1995 Chief Complaint:   Gynecologic Exam  History of Present Illness:   Melanie Cain is a 28 y.o. [phone]  female being seen today for a routine annual exam.  Current complaints: s/p vaginal hysterectomy with bilateral salpingectomy with preservation of ovaries 2023 d/t endometriosis. No vaginal or pelvic complaints. Denies bleeding.  Has had hx of bv, yeast and recurrent UTI.She denies s&s today. She does endorsing using scented soaps in past as well as douche.    No LMP recorded (lmp unknown). Patient has had a hysterectomy.   The pregnancy intention screening data noted above was reviewed. Potential methods of contraception were discussed. The patient elected to proceed with vaginal hysterectomy     09/05/2023    4:33 PM 06/01/2021    3:49 PM 05/04/2021   10:00 AM 03/23/2021   11:28 AM 03/01/2021    3:25 PM  Depression screen PHQ 2/9  Decreased Interest 0 1 1 2 2   Down, Depressed, Hopeless 1 2 2 1 2   PHQ - 2 Score 1 3 3 3 4   Altered sleeping 2 2 3 1 2   Tired, decreased energy 1 1 2 1 2   Change in appetite 1 1 1 2  0  Feeling bad or failure about yourself  0 0 0 0 1  Trouble concentrating 3 1 0 2 1  Moving slowly or fidgety/restless 0 0 0 0 0  Suicidal thoughts 0 0 0 0 0  PHQ-9 Score 8 8 9 9 10         09/05/2023    4:34 PM 06/01/2021    3:50 PM 05/04/2021   10:00 AM 03/23/2021   11:28 AM  GAD 7 : Generalized Anxiety Score  Nervous, Anxious, on Edge 3 2 3 3   Control/stop worrying 2 3 2 2   Worry too much - different things 2 2 2 3   Trouble relaxing 1 1 1 1   Restless 0 1 1 2   Easily annoyed or irritable  3  3  Afraid - awful might happen 0 0 0 0  Total GAD 7 Score  12  14     Review of Systems:   Pertinent items are noted in HPI Denies any headaches, blurred vision, fatigue, shortness of breath, chest pain, abdominal pain, abnormal vaginal discharge/itching/odor/irritation,  problems with periods, bowel movements, urination, or intercourse unless otherwise stated above. Pertinent History Reviewed:  Reviewed past medical,surgical, social and family history.  Reviewed problem list, medications and allergies. Physical Assessment:   Vitals:   09/05/23 1631  BP: 111/70  Pulse: (!) 59  Weight: 165 lb 9.6 oz (75.1 kg)  Height: 5' 6 (1.676 m)  Body mass index is 26.73 kg/m.        Physical Examination:   General appearance - well appearing, and in no distress  Mental status - alert, oriented   Psych:  She has a normal mood and affect  Skin - warm and dry, normal color  Chest - effort norma  Heart - normal rate   Neck:  midline trachea  Breasts - breasts appear normal, no suspicious masses, no skin or nipple changes or  axillary nodes  Abdomen - soft  Pelvic - deferred, self swab collected   Extremities:  No swelling or varicosities noted  Chaperone present for exam  No results found for this or any previous visit (from the past 24 hours).  Assessment & Plan:  1. Encounter for annual  routine gynecological examination (Primary) Encouraged routine self breast exam Discussed prevention methods regarding bv to include avoidance of douching, scented soaps, change out of wet/sweaty clothes, boric acid, probiotic  2. Screening for STD (sexually transmitted disease)  - Cervicovaginal ancillary only( [place]) - Hepatitis B Surface AntiGEN - Hepatitis C Antibody - HIV Antibody (routine testing w rflx) - RPR  3. History of vaginal hysterectomy  Labs/procedures today:   Mammogram: @ 28yo, or sooner if problems Colonoscopy: @ 28yo, or sooner if problems  Orders Placed This Encounter  Procedures   Hepatitis B Surface AntiGEN   Hepatitis C Antibody   HIV Antibody (routine testing w rflx)   RPR   Hepatitis B Surface AntiGEN   Hepatitis C Antibody   HIV Antibody (routine testing w rflx)   RPR    Meds: No orders of the defined types were placed  in this encounter.   Follow-up: Return in about 1 year (around 09/04/2024) for [place] Nidia Delores, FNP

## 2023-09-05 NOTE — Patient Instructions (Signed)
 At Home Vaginal BV Prevention: If engaging in intercourse without a barrier method, consider using semen sponges afterward. You can buy them at [employer].com Begin taking a daily probiotic  Use a gentle antibacterial soap on your vulva (never in the vagina), such as Alaffia's Liquid Black Soap with Tea Tree. Avoid scented or harsh soaps. Use a "tea tree tampon" once a month: Melt 1/4c virgin coconut oil, add 1-2 drops of quality tea tree oil.  Soak a tampon in the mixture  Insert overnight the day after your period has ended. 5.   Place a 30mg  boric acid tablet vaginally every night for 21 nights, then weekly for        9 weeks.

## 2023-09-06 LAB — RPR: RPR Ser Ql: NONREACTIVE

## 2023-09-06 LAB — HIV ANTIBODY (ROUTINE TESTING W REFLEX): HIV Screen 4th Generation wRfx: NONREACTIVE

## 2023-09-06 LAB — HEPATITIS C ANTIBODY: Hep C Virus Ab: NONREACTIVE

## 2023-09-06 LAB — HEPATITIS B SURFACE ANTIGEN: Hepatitis B Surface Ag: NEGATIVE

## 2023-09-08 ENCOUNTER — Ambulatory Visit: Payer: Self-pay | Admitting: Obstetrics and Gynecology

## 2023-09-10 ENCOUNTER — Other Ambulatory Visit: Payer: Self-pay | Admitting: Lactation Services

## 2023-09-10 LAB — CERVICOVAGINAL ANCILLARY ONLY
Bacterial Vaginitis (gardnerella): POSITIVE — AB
Candida Glabrata: NEGATIVE
Candida Vaginitis: NEGATIVE
Chlamydia: NEGATIVE
Comment: NEGATIVE
Comment: NEGATIVE
Comment: NEGATIVE
Comment: NEGATIVE
Comment: NEGATIVE
Comment: NORMAL
Neisseria Gonorrhea: NEGATIVE
Trichomonas: NEGATIVE

## 2023-09-10 MED ORDER — METRONIDAZOLE 500 MG PO TABS: 500.0000 mg | ORAL_TABLET | Freq: Two times a day (BID) | ORAL | 0 refills | Status: DC

## 2023-09-10 NOTE — Progress Notes (Signed)
 Flagyl  sent to Pharmacy per standing order at patients request for BV on vaginal swab.

## 2023-10-02 ENCOUNTER — Ambulatory Visit (HOSPITAL_BASED_OUTPATIENT_CLINIC_OR_DEPARTMENT_OTHER)
Admit: 2023-10-02 | Discharge: 2023-10-02 | Disposition: A | Discharge status: Home / Self Care | Attending: Family Medicine | Admitting: Radiology

## 2023-10-02 ENCOUNTER — Ambulatory Visit (HOSPITAL_BASED_OUTPATIENT_CLINIC_OR_DEPARTMENT_OTHER)
Admission: EM | Admit: 2023-10-02 | Discharge: 2023-10-02 | Disposition: A | Discharge status: Home / Self Care | Attending: Family Medicine | Admitting: Family Medicine

## 2023-10-02 ENCOUNTER — Encounter (HOSPITAL_BASED_OUTPATIENT_CLINIC_OR_DEPARTMENT_OTHER): Payer: Self-pay

## 2023-10-02 DIAGNOSIS — R0602 Shortness of breath: Secondary | ICD-10-CM

## 2023-10-02 LAB — POC COVID19/FLU A&B COMBO
Covid Antigen, POC: NEGATIVE
Influenza A Antigen, POC: NEGATIVE
Influenza B Antigen, POC: NEGATIVE

## 2023-10-02 NOTE — Discharge Instructions (Addendum)
 Your chest x-ray and testing were all negative.  I am not sure of the cause of this.  Could be due to anxiety I would recommend going home, resting and follow-up with your doctor if any continued issues

## 2023-10-02 NOTE — ED Triage Notes (Signed)
 Pt c/o shortness of breath and shaking that started this morning. She used her rescue inhaler at home but was still having issues once she got to work. She used the inhaler at work around 8 then again around 11 but is still having shortness of breath and shaking. She is also having fatigue and feeling hot.

## 2023-10-03 NOTE — ED Provider Notes (Signed)
 Melanie Cain    CSN: [id number] Arrival date & time: 10/02/23  1403      History   Chief Complaint Chief Complaint  Patient presents with   Shortness of Breath    HPI Melanie Cain is a 28 y.o. female.   Pt is a 29 year old female that presents with  shortness of breath and shaking that started this morning. She used her rescue inhaler at home but was still having issues once she got to work. She used the inhaler at work around 8 then again around 11 but is still having shortness of breath and shaking. She is also having fatigue and feeling hot, chills. No fever. Hx of Asthma and anxiety. Current smoker    Shortness of Breath   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   Vitamin D deficiency 11/23/2015   Asthma 11/23/2015   Drug allergy--prednisone 11/23/2015    Past Surgical History:  Procedure Laterality Date   LAPAROSCOPY ABDOMEN DIAGNOSTIC     VAGINAL HYSTERECTOMY Bilateral 04/11/2021   Procedure: HYSTERECTOMY VAGINAL WITH BILATERAL SALPINGECTOMY;  Surgeon: Fredirick Glenys RAMAN, MD;  Location: [facility] OR;  Service: Gynecology;  Laterality: Bilateral;   WISDOM TOOTH EXTRACTION Bilateral     OB History     Gravida  2   Para  2   Term  2   Preterm  0   AB  0   Living  2      SAB  0   IAB  0   Ectopic  0   Multiple  0   Live Births  2            Home Medications    Prior to Admission medications   Medication Sig Start Date End Date Taking? Authorizing Provider  albuterol  (VENTOLIN  HFA) 108 (90 Base) MCG/ACT inhaler Inhale 1-2 puffs into the lungs every 6 (six) hours as  needed for wheezing or shortness of breath. 04/04/23   Billy Asberry FALCON, PA-C  famotidine  (PEPCID ) 20 MG tablet Take 1 tablet (20 mg total) by mouth 2 (two) times daily. 05/29/19 10/29/19  Babara Greig GAILS, PA-C    Family History Family History  Problem Relation Age of Onset   Asthma Mother    Hypertension Mother    Asthma Father    Asthma Sister        2 sisters   Breast cancer Maternal Grandmother    Cancer Maternal Grandmother 40       breast cancer   Hearing loss Maternal Grandmother    Asthma Other    Neuropathy Neg Hx    Stroke Neg Hx     Social History Social History   Tobacco Use   Smoking status: Former    Types: Cigarettes   Smokeless tobacco: Never   Tobacco comments:    Smokes Black & Milds infrequently  Vaping Use   Vaping status: Never Used  Substance Use Topics   Alcohol use: Yes    Comment: social   Drug use: No     Allergies  Montelukast sodium, Prednisone, and Tessalon  [benzonatate ]   Review of Systems Review of Systems  Respiratory:  Positive for shortness of breath.    See HPI  Physical Exam Triage Vital Signs ED Triage Vitals  Encounter Vitals Group     BP 10/02/23 1413 125/84     Girls Systolic BP Percentile --      Girls Diastolic BP Percentile --      Boys Systolic BP Percentile --      Boys Diastolic BP Percentile --      Pulse Rate 10/02/23 1413 80     Resp 10/02/23 1413 20     Temp 10/02/23 1413 97.9 F (36.6 C)     Temp Source 10/02/23 1413 Oral     SpO2 10/02/23 1413 99 %     Weight --      Height --      Head Circumference --      Peak Flow --      Pain Score 10/02/23 1411 0     Pain Loc --      Pain Education --      Exclude from Growth Chart --    No data found.  Updated Vital Signs BP 125/84 (BP Location: Right Arm)   Pulse 80   Temp 97.9 F (36.6 C) (Oral)   Resp 20   LMP  (LMP Unknown) Comment: Has had continually bleeding  SpO2 99%   Visual Acuity Right Eye Distance:   Left Eye Distance:   Bilateral  Distance:    Right Eye Near:   Left Eye Near:    Bilateral Near:     Physical Exam Constitutional:      General: She is not in acute distress.    Appearance: Normal appearance. She is not ill-appearing, toxic-appearing or diaphoretic.  HENT:     Head: Normocephalic and atraumatic.  Eyes:     Conjunctiva/sclera: Conjunctivae normal.  Cardiovascular:     Rate and Rhythm: Normal rate and regular rhythm.     Pulses: Normal pulses.     Heart sounds: Normal heart sounds.  Pulmonary:     Effort: Pulmonary effort is normal.     Breath sounds: Normal breath sounds.  Skin:    General: Skin is warm and dry.  Neurological:     Mental Status: She is alert.  Psychiatric:        Mood and Affect: Mood normal.      UC Treatments / Results  Labs (all labs ordered are listed, but only abnormal results are displayed) Labs Reviewed  POC COVID19/FLU A&B COMBO - Normal    EKG   Radiology DG Chest 2 View Result Date: 10/02/2023 CLINICAL DATA:  Chest tightness. EXAM: CHEST - 2 VIEW COMPARISON:  Chest radiograph dated 08/26/2021. FINDINGS: The heart size and mediastinal contours are within normal limits. Both lungs are clear. The visualized skeletal structures are unremarkable. IMPRESSION: No active cardiopulmonary disease. Electronically Signed   By: Vanetta Chou M.D.   On: 10/02/2023 14:52    Procedures Procedures (including critical Cain time)  Medications Ordered in UC Medications - No data to display  Initial Impression / Assessment and Plan / UC Course  I have reviewed the triage vital signs and the nursing notes.  Pertinent labs & imaging results that were available during my Cain of the patient were reviewed by me and considered in my medical decision making (see chart for details).     SOB- no concerns on exam. Xray normal Lungs clear.  Her symptoms may be due to anxiety or virus Covid and Flu negative  VSS. Recommend go home, rest and follow up with PCP as needed.  If  symptoms worsen was recommended she go to the ER.  Final Clinical Impressions(s) / UC Diagnoses   Final diagnoses:  SOB (shortness of breath)     Discharge Instructions      Your chest x-ray and testing were all negative.  I am not sure of the cause of this.  Could be due to anxiety I would recommend going home, resting and follow-up with your doctor if any continued issues    ED Prescriptions   None    PDMP not reviewed this encounter.   Adah Wilbert LABOR, FNP 10/03/23 [phone]

## 2023-10-31 ENCOUNTER — Ambulatory Visit (HOSPITAL_COMMUNITY)
Admission: RE | Admit: 2023-10-31 | Discharge: 2023-10-31 | Disposition: A | Discharge status: Home / Self Care | Source: Ambulatory Visit | Attending: Family Medicine | Admitting: Family Medicine

## 2023-10-31 ENCOUNTER — Telehealth: Payer: Self-pay | Admitting: *Deleted

## 2023-10-31 ENCOUNTER — Encounter (HOSPITAL_COMMUNITY): Payer: Self-pay

## 2023-10-31 VITALS — BP 131/82 | HR 90 | Temp 99.5°F | Resp 18

## 2023-10-31 DIAGNOSIS — N760 Acute vaginitis: Secondary | ICD-10-CM | POA: Insufficient documentation

## 2023-10-31 DIAGNOSIS — N898 Other specified noninflammatory disorders of vagina: Secondary | ICD-10-CM

## 2023-10-31 MED ORDER — METRONIDAZOLE 500 MG PO TABS: 500.0000 mg | ORAL_TABLET | Freq: Two times a day (BID) | ORAL | 0 refills | Status: DC

## 2023-10-31 NOTE — ED Triage Notes (Signed)
 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 Pt c/o vaginal discharge.

## 2023-10-31 NOTE — Addendum Note (Signed)
 Addended by: ELAINE ROSINA SAILOR on: 10/31/2023 02:07 PM   Modules accepted: Orders

## 2023-10-31 NOTE — Discharge Instructions (Signed)
 Staff will notify you if there is anything positive on the swab. It can take 2-3 days for the tests to result, depending on the day of the week your test was taken. You will only be notified if there are any positives on the testing; test results will also go to your MyChart if you are signed up for MyChart.   Go ahead and start taking the metronidazole  prescription that your GYN doctor is sent in for you.

## 2023-10-31 NOTE — Telephone Encounter (Signed)
 Patient called the nurse line and left a message.  She is requesting the office send in a prescription for treatment for BV, as she is experiencing the symptoms of discharge with odor again.  She tested positive for BV in May and June of this year.  Patient is active in Mychart; message sent to patient to verify which pharmacy to send prescription to.

## 2023-10-31 NOTE — ED Provider Notes (Addendum)
 [facility]    CSN: [id number] Arrival date & time: 10/31/23  1549      History   Chief Complaint Chief Complaint  Patient presents with   SEXUALLY TRANSMITTED DISEASE    Entered by patient    HPI Melanie Cain is a 28 y.o. female.   HPI Here for a few days of vaginal discharge and irritation.  She feels that is consistent with her usual BV infection.  No abdominal pain and no fever or vomiting.  No dysuria.  She is allergic to montelukast and prednisone and Tessalon .  She has had a hysterectomy. 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   Vitamin D deficiency 11/23/2015   Asthma 11/23/2015   Drug allergy--prednisone 11/23/2015    Past Surgical History:  Procedure Laterality Date   LAPAROSCOPY ABDOMEN DIAGNOSTIC     VAGINAL HYSTERECTOMY Bilateral 04/11/2021   Procedure: HYSTERECTOMY VAGINAL WITH BILATERAL SALPINGECTOMY;  Surgeon: Fredirick Glenys RAMAN, MD;  Location: [facility] OR;  Service: Gynecology;  Laterality: Bilateral;   WISDOM TOOTH EXTRACTION Bilateral     OB History     Gravida  2   Para  2   Term  2   Preterm  0   AB  0   Living  2      SAB  0   IAB  0   Ectopic  0   Multiple  0   Live Births  2            Home Medications    Prior to Admission medications   Medication Sig Start Date End Date Taking? Authorizing Provider  albuterol  (VENTOLIN  HFA) 108 (90 Base) MCG/ACT inhaler Inhale 1-2 puffs into the lungs every 6 (six) hours as needed for wheezing or shortness of breath. 04/04/23   Billy Asberry FALCON, PA-C  metroNIDAZOLE  (FLAGYL ) 500 MG tablet Take 1 tablet  (500 mg total) by mouth 2 (two) times daily. 10/31/23   Cleatus Moccasin, MD  famotidine  (PEPCID ) 20 MG tablet Take 1 tablet (20 mg total) by mouth 2 (two) times daily. 05/29/19 10/29/19  Babara Greig GAILS, PA-C    Family History Family History  Problem Relation Age of Onset   Asthma Mother    Hypertension Mother    Asthma Father    Asthma Sister        2 sisters   Breast cancer Maternal Grandmother    Cancer Maternal Grandmother 40       breast cancer   Hearing loss Maternal Grandmother    Asthma Other    Neuropathy Neg Hx    Stroke Neg Hx     Social History Social History   Tobacco Use   Smoking status: Former    Types: Cigarettes   Smokeless tobacco: Never   Tobacco comments:    Smokes Black & Milds infrequently  Vaping Use   Vaping status: Never Used  Substance Use Topics   Alcohol use: Yes    Comment: social   Drug use: No     Allergies   Montelukast sodium, Prednisone, and  Tessalon  [benzonatate ]   Review of Systems Review of Systems   Physical Exam Triage Vital Signs ED Triage Vitals  Encounter Vitals Group     BP 10/31/23 1605 131/82     Girls Systolic BP Percentile --      Girls Diastolic BP Percentile --      Boys Systolic BP Percentile --      Boys Diastolic BP Percentile --      Pulse Rate 10/31/23 1605 90     Resp 10/31/23 1605 18     Temp 10/31/23 1605 99.5 F (37.5 C)     Temp Source 10/31/23 1605 Oral     SpO2 10/31/23 1605 96 %     Weight --      Height --      Head Circumference --      Peak Flow --      Pain Score 10/31/23 1607 0     Pain Loc --      Pain Education --      Exclude from Growth Chart --    No data found.  Updated Vital Signs BP 131/82 (BP Location: Right Arm)   Pulse 90   Temp 99.5 F (37.5 C) (Oral)   Resp 18   LMP  (LMP Unknown) Comment: Has had continually bleeding  SpO2 96%   Visual Acuity Right Eye Distance:   Left Eye Distance:   Bilateral Distance:    Right Eye Near:   Left Eye Near:    Bilateral  Near:     Physical Exam Vitals reviewed.  Constitutional:      General: She is not in acute distress.    Appearance: She is not ill-appearing, toxic-appearing or diaphoretic.  Skin:    Coloration: Skin is not pale.  Neurological:     Mental Status: She is alert and oriented to person, place, and time.  Psychiatric:        Behavior: Behavior normal.      UC Treatments / Results  Labs (all labs ordered are listed, but only abnormal results are displayed) Labs Reviewed  CERVICOVAGINAL ANCILLARY ONLY    EKG   Radiology No results found.  Procedures Procedures (including critical care time)  Medications Ordered in UC Medications - No data to display  Initial Impression / Assessment and Plan / UC Course  I have reviewed the triage vital signs and the nursing notes.  Pertinent labs & imaging results that were available during my care of the patient were reviewed by me and considered in my medical decision making (see chart for details).     Vaginal self swab is done, and we will notify of any positives on that and treat per protocol.  Her GYN specialist has already sent in metronidazole  and she has confirmed that it is at the pharmacy for her to pick up.  She will start taking that this evening.  Final Clinical Impressions(s) / UC Diagnoses   Final diagnoses:  Acute vaginitis     Discharge Instructions      Staff will notify you if there is anything positive on the swab. It can take 2-3 days for the tests to result, depending on the day of the week your test was taken. You will only be notified if there are any positives on the testing; test results will also go to your MyChart if you are signed up for MyChart.   Go ahead and start taking the metronidazole  prescription that your GYN doctor is sent in for  you.     ED Prescriptions   None    PDMP not reviewed this encounter.   Vonna Sharlet POUR, MD 10/31/23 1649    Vonna Sharlet POUR, MD 10/31/23  [phone]

## 2023-11-01 ENCOUNTER — Ambulatory Visit (HOSPITAL_COMMUNITY): Payer: Self-pay

## 2023-11-01 LAB — CERVICOVAGINAL ANCILLARY ONLY
Bacterial Vaginitis (gardnerella): POSITIVE — AB
Candida Glabrata: NEGATIVE
Candida Vaginitis: NEGATIVE
Chlamydia: POSITIVE — AB
Comment: NEGATIVE
Comment: NEGATIVE
Comment: NEGATIVE
Comment: NEGATIVE
Comment: NEGATIVE
Comment: NORMAL
Neisseria Gonorrhea: NEGATIVE
Trichomonas: NEGATIVE

## 2023-11-01 MED ORDER — DOXYCYCLINE HYCLATE 100 MG PO CAPS: 100.0000 mg | ORAL_CAPSULE | Freq: Two times a day (BID) | ORAL | 0 refills | Status: DC

## 2023-11-28 ENCOUNTER — Ambulatory Visit (HOSPITAL_COMMUNITY)
Admission: RE | Admit: 2023-11-28 | Discharge: 2023-11-28 | Disposition: A | Discharge status: Home / Self Care | Source: Ambulatory Visit | Attending: Emergency Medicine | Admitting: Emergency Medicine

## 2023-11-28 ENCOUNTER — Encounter (HOSPITAL_COMMUNITY): Payer: Self-pay

## 2023-11-28 VITALS — BP 123/75 | HR 73 | Temp 99.1°F | Resp 16

## 2023-11-28 DIAGNOSIS — N898 Other specified noninflammatory disorders of vagina: Secondary | ICD-10-CM | POA: Insufficient documentation

## 2023-11-28 NOTE — ED Provider Notes (Signed)
 [facility]    CSN: [id number] Arrival date & time: 11/28/23  1550      History   Chief Complaint Chief Complaint  Patient presents with   Vaginal Discharge    Entered by patient    HPI Melanie Cain is a 28 y.o. female.   Patient presents with persistent vaginal discharge after completing treatment for bacterial vaginosis and chlamydia last week.  Patient is requesting to be retested for both of these to ensure she longer has them.  The patient is not recommended to retest for chlamydia until 4 weeks after treatment is completed.  Patient is understanding of this and understands that her results may remain positive due to just completing treatment last week.  Patient denies any new symptoms including dysuria, hematuria, urinary frequency/urgency, vaginal bleeding, vaginal pain, or vaginal lesions.  Patient also denies any known new exposures to STDs.  The history is provided by the patient and medical records.  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   Vitamin D deficiency 11/23/2015   Asthma 11/23/2015   Drug allergy--prednisone 11/23/2015    Past Surgical History:  Procedure Laterality Date   ABDOMINAL HYSTERECTOMY     LAPAROSCOPY ABDOMEN DIAGNOSTIC     VAGINAL HYSTERECTOMY Bilateral 04/11/2021   Procedure: HYSTERECTOMY VAGINAL WITH BILATERAL SALPINGECTOMY;  Surgeon: Fredirick Glenys RAMAN, MD;  Location: [facility] OR;  Service: Gynecology;  Laterality: Bilateral;   WISDOM TOOTH EXTRACTION Bilateral     OB History     Gravida  2   Para  2   Term  2   Preterm  0   AB   0   Living  2      SAB  0   IAB  0   Ectopic  0   Multiple  0   Live Births  2            Home Medications    Prior to Admission medications   Medication Sig Start Date End Date Taking? Authorizing Provider  albuterol  (VENTOLIN  HFA) 108 (90 Base) MCG/ACT inhaler Inhale 1-2 puffs into the lungs every 6 (six) hours as needed for wheezing or shortness of breath. 04/04/23   Billy Asberry FALCON, PA-C  doxycycline  (VIBRAMYCIN ) 100 MG capsule Take 1 capsule (100 mg total) by mouth 2 (two) times daily. 11/01/23   Vonna Sharlet POUR, MD  metroNIDAZOLE  (FLAGYL ) 500 MG tablet Take 1 tablet (500 mg total) by mouth 2 (two) times daily. 10/31/23   Cleatus Moccasin, MD  famotidine  (PEPCID ) 20 MG tablet Take 1 tablet (20 mg total) by mouth 2 (two) times daily. 05/29/19 10/29/19  Babara Greig GAILS, PA-C    Family History Family History  Problem Relation Age of Onset   Asthma Mother    Hypertension Mother    Asthma Father    Asthma Sister        2 sisters   Breast cancer Maternal Grandmother    Cancer Maternal Grandmother 40       breast cancer   Hearing loss Maternal Grandmother  Asthma Other    Neuropathy Neg Hx    Stroke Neg Hx     Social History Social History   Tobacco Use   Smoking status: Former    Types: Cigarettes   Smokeless tobacco: Never   Tobacco comments:    Smokes Black & Milds infrequently  Vaping Use   Vaping status: Never Used  Substance Use Topics   Alcohol use: Yes    Comment: social   Drug use: No     Allergies   Montelukast sodium, Prednisone, and Tessalon  [benzonatate ]   Review of Systems Review of Systems  Genitourinary:  Positive for vaginal discharge.   Per HPI  Physical Exam Triage Vital Signs ED Triage Vitals  Encounter Vitals Group     BP 11/28/23 1611 123/75     Girls Systolic BP Percentile --      Girls Diastolic BP Percentile --      Boys Systolic BP Percentile --      Boys Diastolic BP Percentile --      Pulse Rate 11/28/23 1611 73      Resp 11/28/23 1611 16     Temp 11/28/23 1611 99.1 F (37.3 C)     Temp Source 11/28/23 1611 Oral     SpO2 11/28/23 1611 97 %     Weight --      Height --      Head Circumference --      Peak Flow --      Pain Score 11/28/23 1610 0     Pain Loc --      Pain Education --      Exclude from Growth Chart --    No data found.  Updated Vital Signs BP 123/75 (BP Location: Right Arm)   Pulse 73   Temp 99.1 F (37.3 C) (Oral)   Resp 16   LMP  (LMP Unknown) Comment: Has had continually bleeding  SpO2 97%   Visual Acuity Right Eye Distance:   Left Eye Distance:   Bilateral Distance:    Right Eye Near:   Left Eye Near:    Bilateral Near:     Physical Exam Vitals and nursing note reviewed.  Constitutional:      General: She is awake. She is not in acute distress.    Appearance: Normal appearance. She is well-developed and well-groomed. She is not ill-appearing.  Genitourinary:    Comments: Exam deferred Neurological:     Mental Status: She is alert.  Psychiatric:        Behavior: Behavior is cooperative.      UC Treatments / Results  Labs (all labs ordered are listed, but only abnormal results are displayed) Labs Reviewed  CERVICOVAGINAL ANCILLARY ONLY    EKG   Radiology No results found.  Procedures Procedures (including critical care time)  Medications Ordered in UC Medications - No data to display  Initial Impression / Assessment and Plan / UC Course  I have reviewed the triage vital signs and the nursing notes.  Pertinent labs & imaging results that were available during my care of the patient were reviewed by me and considered in my medical decision making (see chart for details).     Patient is overall well-appearing.  Vitals are stable.  GU exam deferred.  Patient perform self swab for STD/STI.  HIV and RPR declined.  Discussed with patient that it is not recommended that she receive additional treatment for chlamydia if the result is  positive due to it being too  soon to test for cure.  Discussed follow-up and return precautions. Final Clinical Impressions(s) / UC Diagnoses   Final diagnoses:  Vaginal discharge     Discharge Instructions      Your results will come back over the next few days and someone will call if results are positive and require treatment.   As discussed there is a chance that your test may reveal a positive test for chlamydia because you just completed treatment last week.  If this is the case we would not recommend retreating at this time.  It is recommended to test for cure 4 weeks after completion of treatment for chlamydia. Return here as needed.    ED Prescriptions   None    PDMP not reviewed this encounter.   Johnie Flaming A, NP 11/28/23 [phone]

## 2023-11-28 NOTE — ED Triage Notes (Signed)
 Pt reports has recurring BV and recently took medication for Chlamydia. Requesting STD testing to make sure doesn't have any infections.

## 2023-11-28 NOTE — Discharge Instructions (Signed)
 Your results will come back over the next few days and someone will call if results are positive and require treatment.   As discussed there is a chance that your test may reveal a positive test for chlamydia because you just completed treatment last week.  If this is the case we would not recommend retreating at this time.  It is recommended to test for cure 4 weeks after completion of treatment for chlamydia. Return here as needed.

## 2023-11-29 LAB — CERVICOVAGINAL ANCILLARY ONLY
Bacterial Vaginitis (gardnerella): POSITIVE — AB
Candida Glabrata: NEGATIVE
Candida Vaginitis: NEGATIVE
Chlamydia: NEGATIVE
Comment: NEGATIVE
Comment: NEGATIVE
Comment: NEGATIVE
Comment: NEGATIVE
Comment: NEGATIVE
Comment: NORMAL
Neisseria Gonorrhea: NEGATIVE
Trichomonas: NEGATIVE

## 2023-12-02 ENCOUNTER — Ambulatory Visit (HOSPITAL_COMMUNITY): Payer: Self-pay

## 2023-12-02 MED ORDER — METRONIDAZOLE 500 MG PO TABS: 500.0000 mg | ORAL_TABLET | Freq: Two times a day (BID) | ORAL | 0 refills | Status: DC

## 2023-12-06 MED ORDER — METRONIDAZOLE 0.75 % VA GEL: 1.0000 | Freq: Every day | VAGINAL | 0 refills | Status: AC

## 2023-12-06 NOTE — Telephone Encounter (Signed)
 Pt request metrogel .

## 2024-01-14 ENCOUNTER — Other Ambulatory Visit (HOSPITAL_BASED_OUTPATIENT_CLINIC_OR_DEPARTMENT_OTHER): Payer: Self-pay

## 2024-01-14 MED ORDER — FLUZONE 0.5 ML IM SUSY
0.5000 mL | PREFILLED_SYRINGE | Freq: Once | INTRAMUSCULAR | 0 refills | Status: AC
  Filled 2024-01-14: qty 0.5, 1d supply, fill #0

## 2024-01-16 ENCOUNTER — Ambulatory Visit (HOSPITAL_BASED_OUTPATIENT_CLINIC_OR_DEPARTMENT_OTHER)
Admission: EM | Admit: 2024-01-16 | Discharge: 2024-01-16 | Disposition: A | Discharge status: Home / Self Care | Attending: Family Medicine | Admitting: Family Medicine

## 2024-01-16 ENCOUNTER — Encounter (HOSPITAL_BASED_OUTPATIENT_CLINIC_OR_DEPARTMENT_OTHER): Payer: Self-pay

## 2024-01-16 ENCOUNTER — Ambulatory Visit (INDEPENDENT_AMBULATORY_CARE_PROVIDER_SITE_OTHER): Admit: 2024-01-16 | Discharge: 2024-01-16 | Disposition: A | Discharge status: Home / Self Care | Admitting: Radiology

## 2024-01-16 DIAGNOSIS — R310 Gross hematuria: Secondary | ICD-10-CM

## 2024-01-16 DIAGNOSIS — R1012 Left upper quadrant pain: Secondary | ICD-10-CM | POA: Diagnosis present

## 2024-01-16 DIAGNOSIS — R1024 Suprapubic pain: Secondary | ICD-10-CM | POA: Diagnosis not present

## 2024-01-16 DIAGNOSIS — R1032 Left lower quadrant pain: Secondary | ICD-10-CM

## 2024-01-16 DIAGNOSIS — R300 Dysuria: Secondary | ICD-10-CM

## 2024-01-16 LAB — POCT URINE DIPSTICK
Bilirubin, UA: NEGATIVE
Glucose, UA: NEGATIVE mg/dL
Ketones, POC UA: NEGATIVE mg/dL
Nitrite, UA: NEGATIVE
Protein Ur, POC: >=300 mg/dL — AB
Spec Grav, UA: 1.025 (ref 1.010–1.025)
Urobilinogen, UA: 1 U/dL
pH, UA: 6.5 (ref 5.0–8.0)

## 2024-01-16 MED ORDER — NITROFURANTOIN MONOHYD MACRO 100 MG PO CAPS: 100.0000 mg | ORAL_CAPSULE | Freq: Two times a day (BID) | ORAL | 0 refills | Status: AC

## 2024-01-16 NOTE — Discharge Instructions (Addendum)
 Abdominal pain, hematuria and frequency/urgency of urination: Abdominal x-ray is negative.  No kidney stones noted.  Urinalysis is abnormal and does show blood in the urine.  Urine culture sent.  Will treat with nitrofurantoin , 100 mg, twice daily for 7 days.  Get plenty of fluids and rest.  Needs recheck urinalysis in 3 weeks due to the hematuria.  If hematuria persist will need to see urology.  See below for signs and symptoms of worsening condition and reasons to go to the emergency room.  Get help right away if: You cannot stop vomiting. Your pain is only in one part of the abdomen. Pain on the right side could be caused by appendicitis. You have bloody or black poop (stool), or poop that looks like tar. You have trouble breathing. You have chest pain. These symptoms may be an emergency. Get help right away. Call 911. Do not wait to see if the symptoms will go away. Do not drive yourself to the hospital.

## 2024-01-16 NOTE — ED Provider Notes (Signed)
 [name]    CSN: [id number] Arrival date & time: 01/16/24  0744      History   Chief Complaint Chief Complaint  Patient presents with   Urinary Frequency    HPI Melanie Cain is a 28 y.o. female.   28 year old female with report of frequency and urgency of urination since Monday, 01/13/2024.  She tried a boric acid vaginal suppository and felt like it actually made her feel more uncomfortable.  It did not help her symptoms at all.  She is not having a menstrual cycle right now.  She has seen blood in her urine.   Urinary Frequency Associated symptoms include abdominal pain. Pertinent negatives include no chest pain and no shortness of breath.    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   Vitamin D deficiency 11/23/2015   Asthma 11/23/2015   Drug allergy--prednisone 11/23/2015    Past Surgical History:  Procedure Laterality Date   ABDOMINAL HYSTERECTOMY     LAPAROSCOPY ABDOMEN DIAGNOSTIC     VAGINAL HYSTERECTOMY Bilateral 04/11/2021   Procedure: HYSTERECTOMY VAGINAL WITH BILATERAL SALPINGECTOMY;  Surgeon: Fredirick Glenys RAMAN, MD;  Location: [facility] OR;  Service: Gynecology;  Laterality: Bilateral;   WISDOM TOOTH EXTRACTION Bilateral     OB History     Gravida  2   Para  2   Term  2   Preterm  0   AB  0   Living  2      SAB  0   IAB  0   Ectopic  0   Multiple  0   Live Births  2            Home Medications    Prior to Admission medications   Medication Sig Start Date End Date Taking? Authorizing Provider  nitrofurantoin , macrocrystal-monohydrate, (MACROBID ) 100 MG  capsule Take 1 capsule (100 mg total) by mouth 2 (two) times daily for 7 days. 01/16/24 01/23/24 Yes Ival Domino, FNP  albuterol  (VENTOLIN  HFA) 108 (90 Base) MCG/ACT inhaler Inhale 1-2 puffs into the lungs every 6 (six) hours as needed for wheezing or shortness of breath. 04/04/23   Billy Asberry FALCON, PA-C  famotidine  (PEPCID ) 20 MG tablet Take 1 tablet (20 mg total) by mouth 2 (two) times daily. 05/29/19 10/29/19  Babara Greig GAILS, PA-C    Family History Family History  Problem Relation Age of Onset   Asthma Mother    Hypertension Mother    Asthma Father    Asthma Sister        2 sisters   Breast cancer Maternal Grandmother    Cancer Maternal Grandmother 40       breast cancer   Hearing loss Maternal Grandmother    Asthma Other    Neuropathy Neg Hx    Stroke Neg Hx     Social History Social History   Tobacco Use   Smoking status: Former    Types: Cigarettes   Smokeless tobacco: Never   Tobacco comments:    Smokes Black & Milds infrequently  Vaping Use  Vaping status: Never Used  Substance Use Topics   Alcohol use: Yes    Comment: social   Drug use: No     Allergies   Montelukast sodium, Prednisone, and Tessalon  [benzonatate ]   Review of Systems Review of Systems  Constitutional:  Negative for chills and fever.  HENT:  Negative for ear pain and sore throat.   Eyes:  Negative for pain and visual disturbance.  Respiratory:  Negative for cough and shortness of breath.   Cardiovascular:  Negative for chest pain and palpitations.  Gastrointestinal:  Positive for abdominal pain. Negative for constipation, diarrhea, nausea and vomiting.  Genitourinary:  Positive for frequency, hematuria and urgency. Negative for dysuria.  Musculoskeletal:  Negative for arthralgias and back pain.  Skin:  Negative for color change and rash.  Neurological:  Negative for seizures and syncope.  All other systems reviewed and are negative.    Physical Exam Triage Vital Signs ED Triage Vitals   Encounter Vitals Group     BP 01/16/24 0811 126/85     Girls Systolic BP Percentile --      Girls Diastolic BP Percentile --      Boys Systolic BP Percentile --      Boys Diastolic BP Percentile --      Pulse Rate 01/16/24 0811 80     Resp 01/16/24 0811 20     Temp 01/16/24 0811 97.7 F (36.5 C)     Temp Source 01/16/24 0811 Oral     SpO2 01/16/24 0811 98 %     Weight --      Height --      Head Circumference --      Peak Flow --      Pain Score 01/16/24 0810 0     Pain Loc --      Pain Education --      Exclude from Growth Chart --    No data found.  Updated Vital Signs BP 126/85 (BP Location: Right Arm)   Pulse 80   Temp 97.7 F (36.5 C) (Oral)   Resp 20   LMP  (LMP Unknown) Comment: Has had continually bleeding  SpO2 98%   Visual Acuity Right Eye Distance:   Left Eye Distance:   Bilateral Distance:    Right Eye Near:   Left Eye Near:    Bilateral Near:     Physical Exam Vitals and nursing note reviewed.  Constitutional:      General: She is not in acute distress.    Appearance: She is well-developed. She is not ill-appearing or toxic-appearing.  HENT:     Head: Normocephalic and atraumatic.     Right Ear: Hearing, tympanic membrane, ear canal and external ear normal.     Left Ear: Hearing, tympanic membrane, ear canal and external ear normal.     Nose: No congestion or rhinorrhea.     Right Sinus: No maxillary sinus tenderness or frontal sinus tenderness.     Left Sinus: No maxillary sinus tenderness or frontal sinus tenderness.     Mouth/Throat:     Lips: Pink.     Mouth: Mucous membranes are moist.     Pharynx: Uvula midline. No oropharyngeal exudate or posterior oropharyngeal erythema.     Tonsils: No tonsillar exudate.  Eyes:     Conjunctiva/sclera: Conjunctivae normal.     Pupils: Pupils are equal, round, and reactive to light.  Cardiovascular:     Rate and Rhythm: Normal rate and regular rhythm.  Heart sounds: S1 normal and S2 normal. No  murmur heard. Pulmonary:     Effort: Pulmonary effort is normal. No respiratory distress.     Breath sounds: Normal breath sounds. No decreased breath sounds, wheezing, rhonchi or rales.  Abdominal:     General: Bowel sounds are normal.     Palpations: Abdomen is soft.     Tenderness: There is abdominal tenderness in the suprapubic area, left upper quadrant and left lower quadrant. There is left CVA tenderness. There is no right CVA tenderness, guarding or rebound. Negative signs include Murphy's sign, Rovsing's sign and McBurney's sign.  Musculoskeletal:        General: No swelling.     Cervical back: Neck supple.  Lymphadenopathy:     Head:     Right side of head: No submental, submandibular, tonsillar, preauricular or posterior auricular adenopathy.     Left side of head: No submental, submandibular, tonsillar, preauricular or posterior auricular adenopathy.     Cervical: No cervical adenopathy.     Right cervical: No superficial cervical adenopathy.    Left cervical: No superficial cervical adenopathy.  Skin:    General: Skin is warm and dry.     Capillary Refill: Capillary refill takes less than 2 seconds.     Findings: No rash.  Neurological:     Mental Status: She is alert and oriented to person, place, and time.  Psychiatric:        Mood and Affect: Mood normal.      UC Treatments / Results  Labs (all labs ordered are listed, but only abnormal results are displayed) Labs Reviewed  POCT URINE DIPSTICK - Abnormal; Notable for the following components:      Result Value   Clarity, UA cloudy (*)    Blood, UA large (*)    Protein Ur, POC >=300 (*)    Leukocytes, UA Large (3+) (*)    All other components within normal limits  URINE CULTURE    EKG   Radiology DG Abd 1 View Result Date: 01/16/2024 CLINICAL DATA:  Abdominal pain with urinary frequency/urgency several days. EXAM: ABDOMEN - 1 VIEW COMPARISON:  None Available. FINDINGS: Bowel gas pattern is  nonobstructive. Few small phleboliths over the pelvis. Remaining bones and soft tissues are unremarkable. IMPRESSION: Nonobstructive bowel gas pattern. Electronically Signed   By: Toribio Agreste M.D.   On: 01/16/2024 09:06    Procedures Procedures (including critical care time)  Medications Ordered in UC Medications - No data to display  Initial Impression / Assessment and Plan / UC Course  I have reviewed the triage vital signs and the nursing notes.  Pertinent labs & imaging results that were available during my care of the patient were reviewed by me and considered in my medical decision making (see chart for details).  Plan of Care: Abdominal pain, hematuria and frequency/urgency of urination: Abdominal x-ray is negative.  No kidney stones noted.  Urinalysis is abnormal and does show blood in the urine.  Urine culture sent.  Will treat with nitrofurantoin , 100 mg, twice daily for 7 days.  Get plenty of fluids and rest.  Needs recheck urinalysis in 3 weeks due to the hematuria.  If hematuria persist will need to see urology.  See discharge instructions for signs and symptoms of worsening condition and reasons to go to the emergency room.  I reviewed the plan of care with the patient and/or the patient's guardian.  The patient and/or guardian had time to ask questions and  acknowledged that the questions were answered.  I provided instruction on symptoms or reasons to return here or to go to an ER, if symptoms/condition did not improve, worsened or if new symptoms occurred.  Final Clinical Impressions(s) / UC Diagnoses   Final diagnoses:  Dysuria  Gross hematuria  Suprapubic pain  Left lower quadrant abdominal pain  Left upper quadrant abdominal pain     Discharge Instructions      Abdominal pain, hematuria and frequency/urgency of urination: Abdominal x-ray is negative.  No kidney stones noted.  Urinalysis is abnormal and does show blood in the urine.  Urine culture sent.  Will  treat with nitrofurantoin , 100 mg, twice daily for 7 days.  Get plenty of fluids and rest.  Needs recheck urinalysis in 3 weeks due to the hematuria.  If hematuria persist will need to see urology.  See below for signs and symptoms of worsening condition and reasons to go to the emergency room.  Get help right away if: You cannot stop vomiting. Your pain is only in one part of the abdomen. Pain on the right side could be caused by appendicitis. You have bloody or black poop (stool), or poop that looks like tar. You have trouble breathing. You have chest pain. These symptoms may be an emergency. Get help right away. Call 911. Do not wait to see if the symptoms will go away. Do not drive yourself to the hospital.     ED Prescriptions     Medication Sig Dispense Auth. Provider   nitrofurantoin , macrocrystal-monohydrate, (MACROBID ) 100 MG capsule Take 1 capsule (100 mg total) by mouth 2 (two) times daily for 7 days. 14 capsule Ival Domino, FNP      PDMP not reviewed this encounter.   Ival Domino, FNP 01/16/24 [phone]

## 2024-01-16 NOTE — ED Triage Notes (Signed)
 Pt c/o urinary frequency and slight urgency since Monday. She did use a boric acid tab and felt like it made her symptoms worst.

## 2024-01-18 LAB — URINE CULTURE: Culture: 60000 — AB

## 2024-01-20 ENCOUNTER — Ambulatory Visit (HOSPITAL_BASED_OUTPATIENT_CLINIC_OR_DEPARTMENT_OTHER): Payer: Self-pay | Admitting: Family Medicine

## 2024-01-20 NOTE — Progress Notes (Signed)
 Urine culture was positive for E. coli with a moderate colony count.  It is sensitive to nitrofurantoin .  Patient will complete the nitrofurantoin  and follow-up in 3 weeks for recheck urine.  Patient updated directly.

## 2024-01-24 ENCOUNTER — Ambulatory Visit: Admitting: Obstetrics & Gynecology

## 2024-02-15 IMAGING — CT CT HEAD W/O CM
3 series · 16 of 47 positions shown, 19 images · non-contrast
Comparison: 07/31/2018

CLINICAL DATA: Trauma



[Series 4: head 5.0 h30s · axial · 0.45mm/px · z∈[-79,+51]mm · 10 of 32 slices shown, 13 images]
[im 3/32  brain]
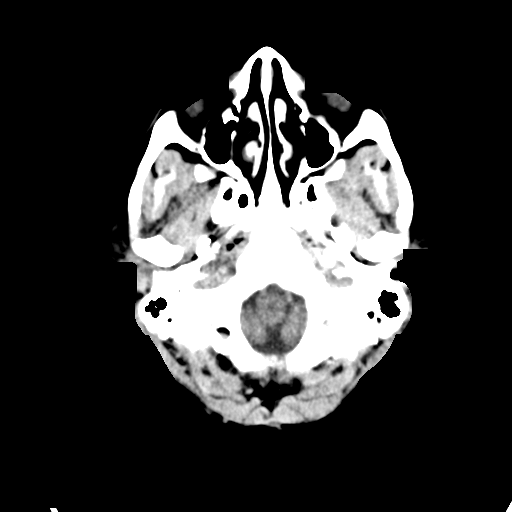
[im 3/32  bone]
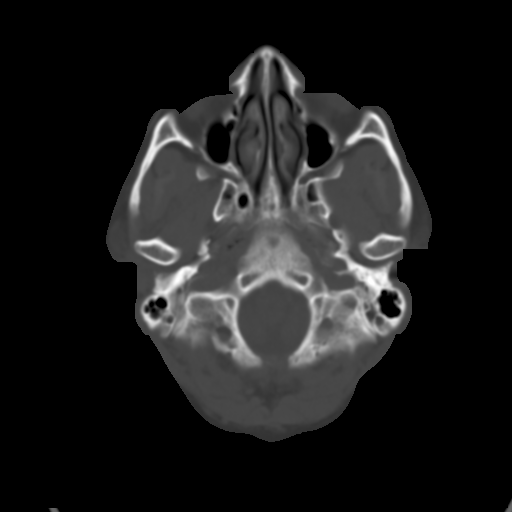
[im 6/32  brain]
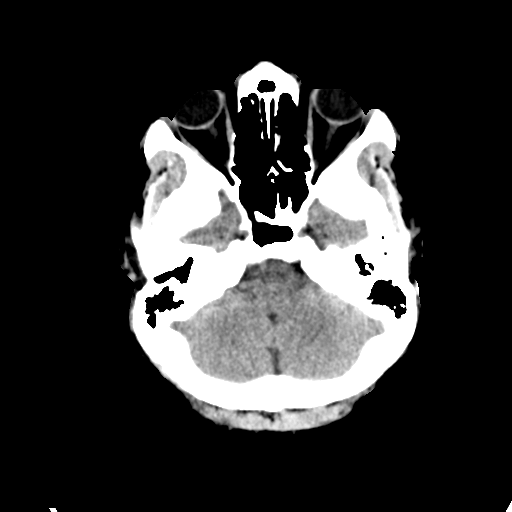
[im 9/32  brain]
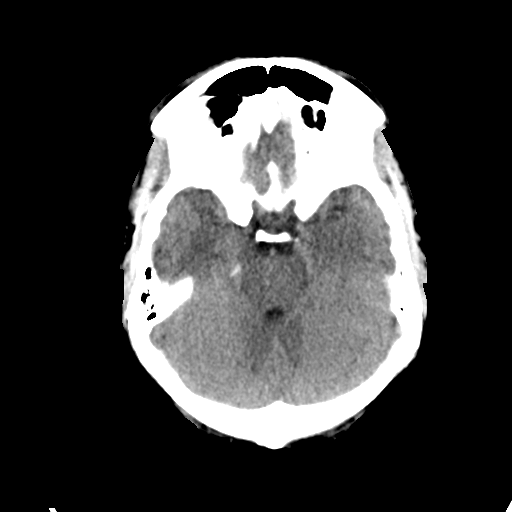
[im 11/32  brain]
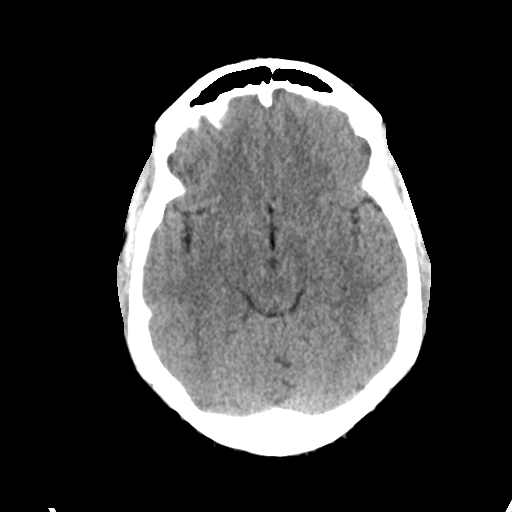
[im 14/32  brain]
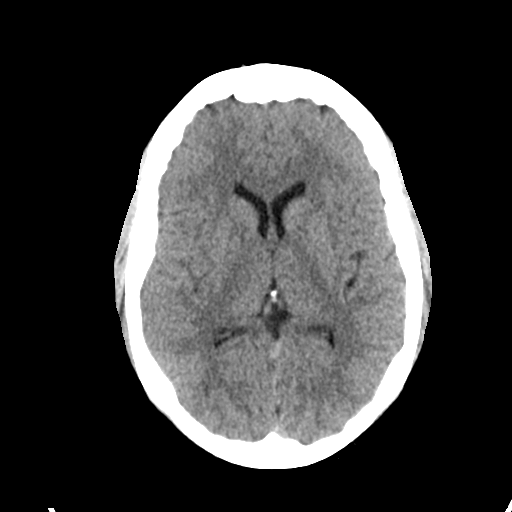
[im 14/32  bone]
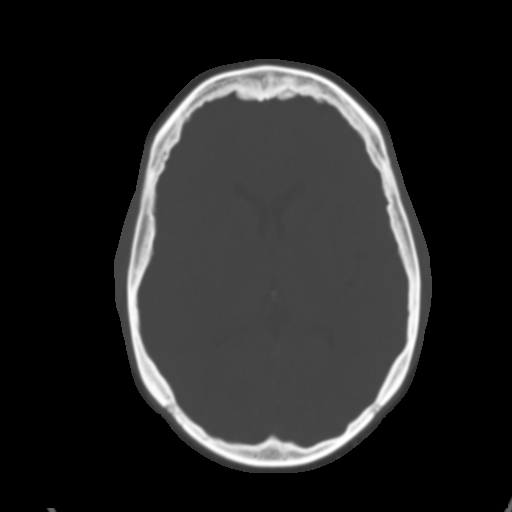
[im 18/32  brain]
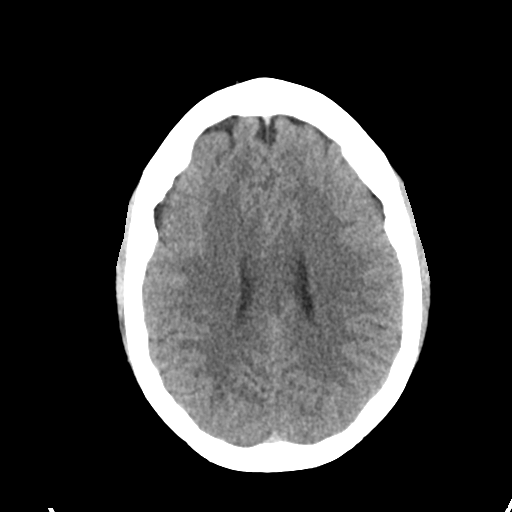
[im 21/32  brain]
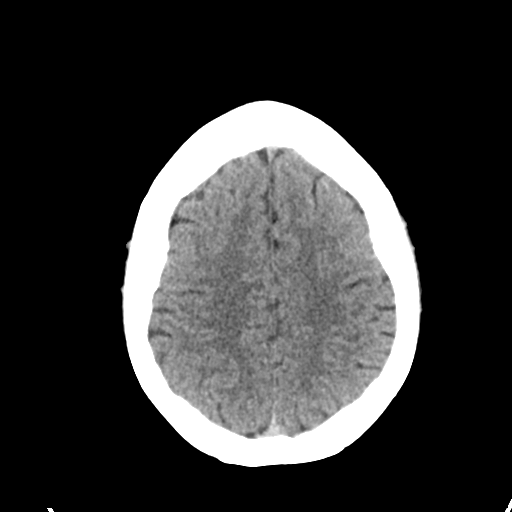
[im 24/32  brain]
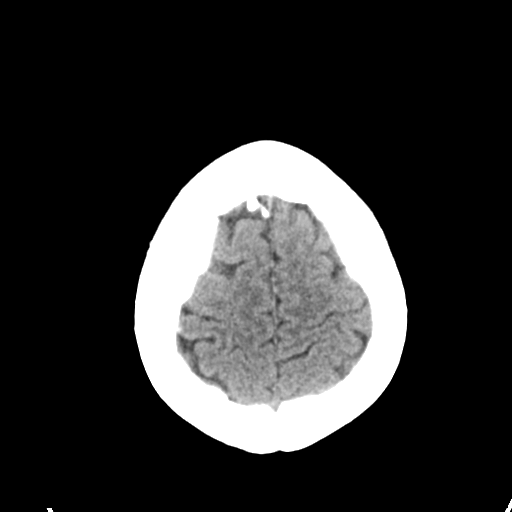
[im 26/32  brain]
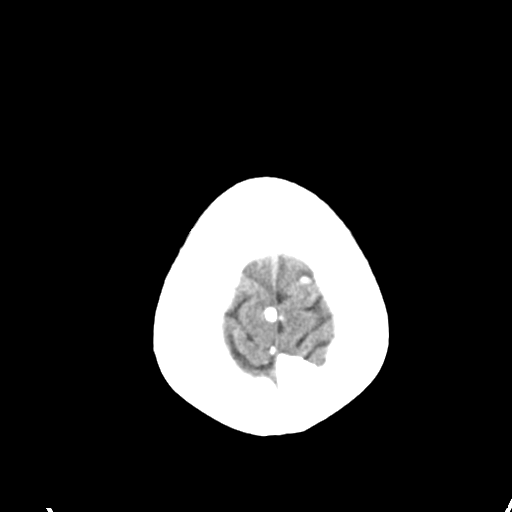
[im 26/32  bone]
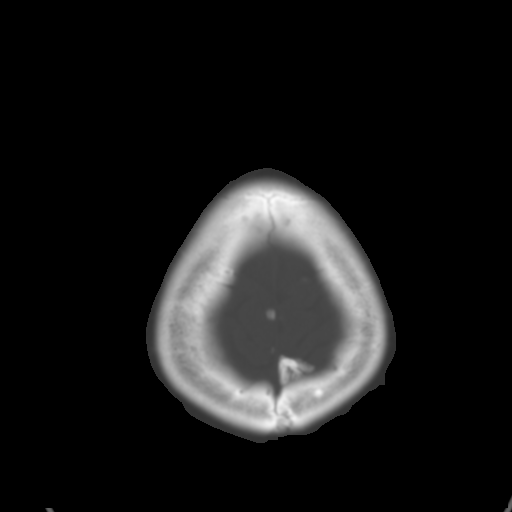
[im 29/32  brain]
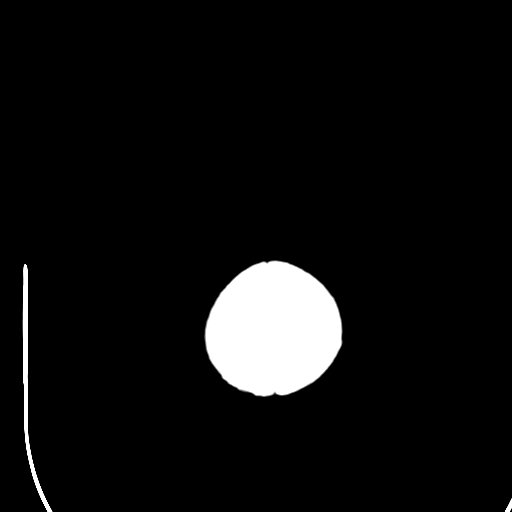

[Series 5: head 3.0 mpr cor · coronal · 0.33mm/px · 3 of 67 slices shown]
[im 23/67  brain]
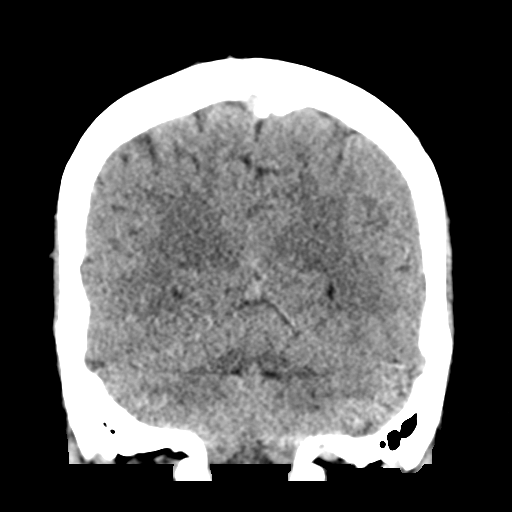
[im 30/67  brain]
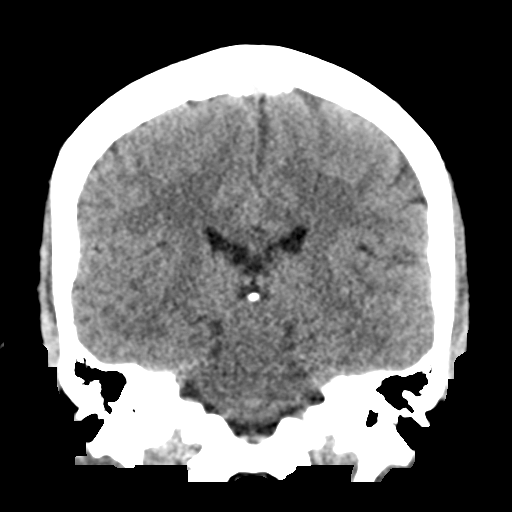
[im 37/67  brain]
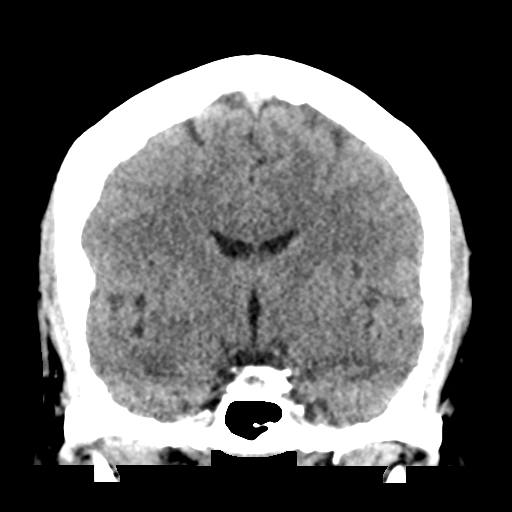

[Series 6: head 3.0 mpr sag · sagittal · 0.32mm/px · 3 of 56 slices shown]
[im 19/56  brain]
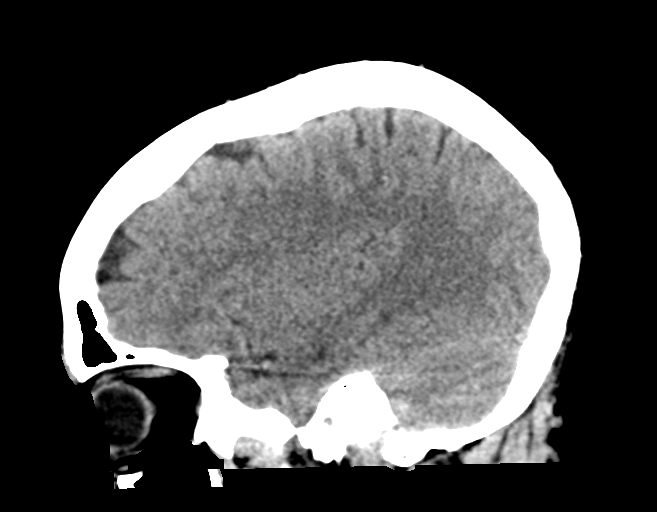
[im 28/56  brain]
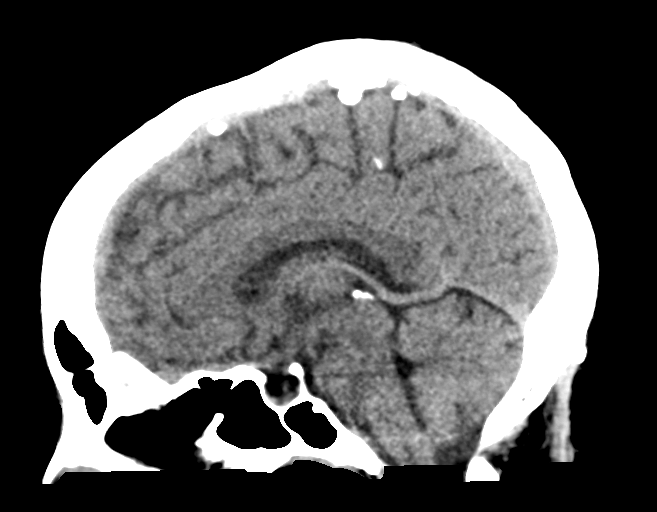
[im 37/56  brain]
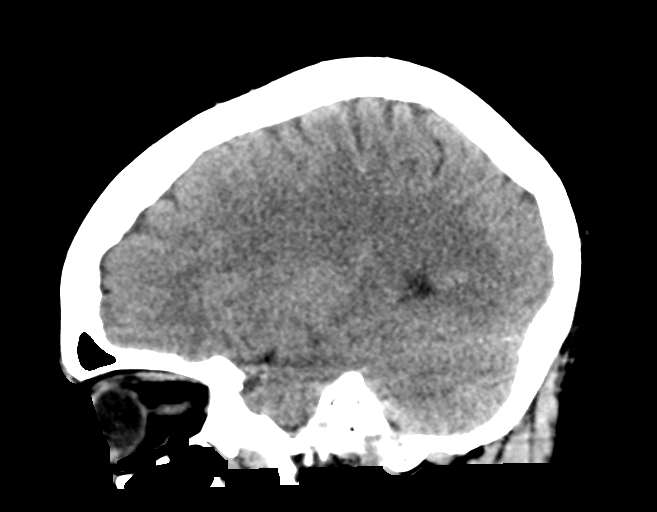

[16 of 47 positions shown; findings below may reference images not displayed]

FINDINGS: Brain: No acute intracranial findings are seen. There are no signs
of bleeding within the cranium. Ventricles are not dilated. There is
no focal mass effect.

Vascular: Unremarkable.

Skull: Unremarkable.

Sinuses/Orbits: There is mild mucosal thickening in the ethmoid
sinus.

Other: No significant interval changes are noted.
IMPRESSION: No acute intracranial findings are seen in the noncontrast CT brain.

## 2024-03-01 ENCOUNTER — Ambulatory Visit (HOSPITAL_COMMUNITY)
Admission: RE | Admit: 2024-03-01 | Discharge: 2024-03-01 | Disposition: A | Discharge status: Home / Self Care | Attending: Emergency Medicine | Admitting: Emergency Medicine

## 2024-03-01 ENCOUNTER — Encounter (HOSPITAL_COMMUNITY): Payer: Self-pay

## 2024-03-01 VITALS — BP 124/73 | HR 74 | Temp 98.7°F | Resp 17

## 2024-03-01 DIAGNOSIS — R809 Proteinuria, unspecified: Secondary | ICD-10-CM | POA: Diagnosis not present

## 2024-03-01 DIAGNOSIS — R300 Dysuria: Secondary | ICD-10-CM | POA: Diagnosis not present

## 2024-03-01 DIAGNOSIS — N3001 Acute cystitis with hematuria: Secondary | ICD-10-CM | POA: Diagnosis not present

## 2024-03-01 LAB — COMPREHENSIVE METABOLIC PANEL WITH GFR
ALT: 16 U/L (ref 0–44)
AST: 19 U/L (ref 15–41)
Albumin: 4.5 g/dL (ref 3.5–5.0)
Alkaline Phosphatase: 62 U/L (ref 38–126)
Anion gap: 10 (ref 5–15)
BUN: 12 mg/dL (ref 6–20)
CO2: 24 mmol/L (ref 22–32)
Calcium: 9.6 mg/dL (ref 8.9–10.3)
Chloride: 104 mmol/L (ref 98–111)
Creatinine, Ser: 0.79 mg/dL (ref 0.44–1.00)
GFR, Estimated: >60 mL/min
Glucose, Bld: 87 mg/dL (ref 70–99)
Potassium: 4.1 mmol/L (ref 3.5–5.1)
Sodium: 137 mmol/L (ref 135–145)
Total Bilirubin: 1.2 mg/dL (ref 0.0–1.2)
Total Protein: 7.4 g/dL (ref 6.5–8.1)

## 2024-03-01 LAB — POCT URINE PREGNANCY: Preg Test, Ur: NEGATIVE

## 2024-03-01 LAB — CBC WITH DIFFERENTIAL/PLATELET
Abs Immature Granulocytes: 0.02 K/uL (ref 0.00–0.07)
Basophils Absolute: 0 K/uL (ref 0.0–0.1)
Basophils Relative: 0 %
Eosinophils Absolute: 0.1 K/uL (ref 0.0–0.5)
Eosinophils Relative: 1 %
HCT: 43.6 % (ref 36.0–46.0)
Hemoglobin: 14.2 g/dL (ref 12.0–15.0)
Immature Granulocytes: 0 %
Lymphocytes Relative: 38 %
Lymphs Abs: 2.5 K/uL (ref 0.7–4.0)
MCH: 26.8 pg (ref 26.0–34.0)
MCHC: 32.6 g/dL (ref 30.0–36.0)
MCV: 82.4 fL (ref 80.0–100.0)
Monocytes Absolute: 0.6 K/uL (ref 0.1–1.0)
Monocytes Relative: 8 %
Neutro Abs: 3.5 K/uL (ref 1.7–7.7)
Neutrophils Relative %: 53 %
Platelets: 216 K/uL (ref 150–400)
RBC: 5.29 MIL/uL — ABNORMAL HIGH (ref 3.87–5.11)
RDW: 15.2 % (ref 11.5–15.5)
WBC: 6.7 K/uL (ref 4.0–10.5)
nRBC: 0 % (ref 0.0–0.2)

## 2024-03-01 LAB — POCT URINE DIPSTICK
Glucose, UA: NEGATIVE mg/dL
Ketones, POC UA: NEGATIVE mg/dL
Nitrite, UA: POSITIVE — AB
Protein Ur, POC: >=300 mg/dL — AB
Spec Grav, UA: 1.025
Urobilinogen, UA: 1 U/dL
pH, UA: 6

## 2024-03-01 MED ORDER — SULFAMETHOXAZOLE-TRIMETHOPRIM 800-160 MG PO TABS: 1.0000 | ORAL_TABLET | Freq: Two times a day (BID) | ORAL | 0 refills | Status: AC

## 2024-03-01 NOTE — ED Triage Notes (Signed)
 Thursday started having urinary frequency. Noticed blood in urine this morning. Repots has little cramping.

## 2024-03-01 NOTE — Discharge Instructions (Signed)
 Take the Bactrim  twice daily for the next 3 days Ensure you are drinking at least 64 ounces of water daily to help flush the kidneys Your urine sample again showed a lot of protein, we are checking some basic labs that include your kidney function  Follow-up with a primary care provider in the next few weeks for urine recheck, to ensure proteinuria has improved Return to clinic for any new or urgent symptoms or persistent symptoms despite treatment

## 2024-03-01 NOTE — ED Provider Notes (Signed)
 " [facility]    CSN: [id number] Arrival date & time: 03/01/24  1717      History   Chief Complaint Chief Complaint  Patient presents with   Appointment    HPI Melanie Cain is a 28 y.o. female.   Patient presents to clinic over concern of urinary frequency and urgency.  Noticed bright red blood when she wiped after urination yesterday into today.  Does have a history of frequent UTIs.  UA in May did not have protein or evidence of UTI. Most recent UA on 11/6 had proteinuria and gross hematuria. Took Macrobid  for this UTI and symptoms improved.   Patient has not had low back pain, abdominal pain, nausea, vomiting or fever.  Has had lower abdominal cramping.  She is currently sexually active.  Has 2 kids at home.  She has had a hysterectomy.  Denies vaginal discharge.   The history is provided by the patient.    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   Vitamin D deficiency 11/23/2015   Asthma 11/23/2015   Drug allergy--prednisone 11/23/2015    Past Surgical History:  Procedure Laterality Date   ABDOMINAL HYSTERECTOMY     LAPAROSCOPY ABDOMEN DIAGNOSTIC     VAGINAL HYSTERECTOMY Bilateral 04/11/2021   Procedure: HYSTERECTOMY VAGINAL WITH BILATERAL SALPINGECTOMY;  Surgeon: Fredirick Glenys RAMAN, MD;  Location: [facility] OR;  Service: Gynecology;  Laterality: Bilateral;   WISDOM TOOTH EXTRACTION Bilateral     OB History     Gravida  2   Para  2   Term  2   Preterm  0   AB  0   Living  2      SAB  0   IAB  0   Ectopic  0   Multiple  0   Live Births  2            Home  Medications    Prior to Admission medications  Medication Sig Start Date End Date Taking? Authorizing Provider  sulfamethoxazole -trimethoprim  (BACTRIM  DS) 800-160 MG tablet Take 1 tablet by mouth 2 (two) times daily for 3 days. 03/01/24 03/04/24 Yes Lizvette Lightsey  N, FNP  albuterol  (VENTOLIN  HFA) 108 (90 Base) MCG/ACT inhaler Inhale 1-2 puffs into the lungs every 6 (six) hours as needed for wheezing or shortness of breath. 04/04/23   Billy Asberry FALCON, PA-C  famotidine  (PEPCID ) 20 MG tablet Take 1 tablet (20 mg total) by mouth 2 (two) times daily. 05/29/19 10/29/19  Babara Greig GAILS, PA-C    Family History Family History  Problem Relation Age of Onset   Asthma Mother    Hypertension Mother    Asthma Father    Asthma Sister        2 sisters   Breast cancer Maternal Grandmother    Cancer Maternal Grandmother 40       breast cancer   Hearing loss Maternal Grandmother    Asthma Other    Neuropathy Neg Hx    Stroke Neg Hx     Social History Social History[1]   Allergies  Montelukast sodium, Prednisone, and Tessalon  [benzonatate ]   Review of Systems Review of Systems  Per HPI  Physical Exam Triage Vital Signs ED Triage Vitals  Encounter Vitals Group     BP 03/01/24 1745 124/73     Girls Systolic BP Percentile --      Girls Diastolic BP Percentile --      Boys Systolic BP Percentile --      Boys Diastolic BP Percentile --      Pulse Rate 03/01/24 1745 74     Resp 03/01/24 1745 17     Temp 03/01/24 1745 98.7 F (37.1 C)     Temp Source 03/01/24 1745 Oral     SpO2 03/01/24 1745 97 %     Weight --      Height --      Head Circumference --      Peak Flow --      Pain Score 03/01/24 1744 5     Pain Loc --      Pain Education --      Exclude from Growth Chart --    No data found.  Updated Vital Signs BP 124/73 (BP Location: Right Arm)   Pulse 74   Temp 98.7 F (37.1 C) (Oral)   Resp 17   LMP  (LMP Unknown) Comment: Has had continually bleeding  SpO2 97%   Visual  Acuity Right Eye Distance:   Left Eye Distance:   Bilateral Distance:    Right Eye Near:   Left Eye Near:    Bilateral Near:     Physical Exam   UC Treatments / Results  Labs (all labs ordered are listed, but only abnormal results are displayed) Labs Reviewed  POCT URINE DIPSTICK - Abnormal; Notable for the following components:      Result Value   Clarity, UA cloudy (*)    Bilirubin, UA small (*)    Blood, UA moderate (*)    Protein Ur, POC >=300 (*)    Nitrite, UA Positive (*)    Leukocytes, UA Small (1+) (*)    All other components within normal limits  URINE CULTURE  COMPREHENSIVE METABOLIC PANEL WITH GFR  CBC WITH DIFFERENTIAL/PLATELET  POCT URINE PREGNANCY    EKG   Radiology No results found.  Procedures Procedures (including critical care time)  Medications Ordered in UC Medications - No data to display  Initial Impression / Assessment and Plan / UC Course  I have reviewed the triage vital signs and the nursing notes.  Pertinent labs & imaging results that were available during my care of the patient were reviewed by me and considered in my medical decision making (see chart for details).  Vitals and triage reviewed, patient is hemodynamically stable.  UA with cloudy urine, small bili, moderate red blood cells, significant protein, nitrate positive and small leukocytes.  Concerning for acute cystitis, will treat with Bactrim  and send for culture.  Negative for CVA tenderness, afebrile without tachycardia, low clinical concern for pyelonephritis.  Continues to have significant proteinuria on back-to-back visits.  Will check basic labs.  Encouraged PCP follow-up for reevaluation to ensure improvement over the next week or so.  Staff will contact if treatment changes are needed based on culture results.  Plan of care, follow-up care and return precautions given, no questions at this time.    Final Clinical Impressions(s) / UC Diagnoses   Final  diagnoses:  Dysuria  Acute cystitis with hematuria  Proteinuria, unspecified type  Discharge Instructions      Take the Bactrim  twice daily for the next 3 days Ensure you are drinking at least 64 ounces of water daily to help flush the kidneys Your urine sample again showed a lot of protein, we are checking some basic labs that include your kidney function  Follow-up with a primary care provider in the next few weeks for urine recheck, to ensure proteinuria has improved Return to clinic for any new or urgent symptoms or persistent symptoms despite treatment     ED Prescriptions     Medication Sig Dispense Auth. Provider   sulfamethoxazole -trimethoprim  (BACTRIM  DS) 800-160 MG tablet Take 1 tablet by mouth 2 (two) times daily for 3 days. 6 tablet Dreama, Almas Rake  N, FNP      PDMP not reviewed this encounter.     [1]  Social History Tobacco Use   Smoking status: Former    Types: Cigarettes   Smokeless tobacco: Never   Tobacco comments:    Smokes Black & Milds infrequently  Vaping Use   Vaping status: Never Used  Substance Use Topics   Alcohol use: Yes    Comment: social   Drug use: No     Dreama Henreitta SAILOR, FNP 03/01/24 1822  "

## 2024-03-02 ENCOUNTER — Ambulatory Visit (HOSPITAL_COMMUNITY): Payer: Self-pay

## 2024-03-03 ENCOUNTER — Other Ambulatory Visit (HOSPITAL_BASED_OUTPATIENT_CLINIC_OR_DEPARTMENT_OTHER): Payer: Self-pay

## 2024-03-03 LAB — URINE CULTURE: Culture: 60000 — AB
